# Patient Record
Sex: Male | Born: 1940
Health system: Southern US, Community
[De-identification: ages and names within clinical notes are randomized; demographics above are authoritative.]

## PROBLEM LIST (undated history)

## (undated) DIAGNOSIS — Z9289 Personal history of other medical treatment: Secondary | ICD-10-CM

## (undated) DIAGNOSIS — M1990 Unspecified osteoarthritis, unspecified site: Secondary | ICD-10-CM

## (undated) DIAGNOSIS — E785 Hyperlipidemia, unspecified: Secondary | ICD-10-CM

## (undated) DIAGNOSIS — I2510 Atherosclerotic heart disease of native coronary artery without angina pectoris: Secondary | ICD-10-CM

## (undated) DIAGNOSIS — C4490 Unspecified malignant neoplasm of skin, unspecified: Secondary | ICD-10-CM

## (undated) DIAGNOSIS — I10 Essential (primary) hypertension: Secondary | ICD-10-CM

## (undated) HISTORY — PX: INGUINAL HERNIA REPAIR: SHX194

## (undated) HISTORY — DX: Atherosclerotic heart disease of native coronary artery without angina pectoris: I25.10

## (undated) HISTORY — PX: VASECTOMY: SHX75

## (undated) HISTORY — PX: HIP SURGERY: SHX245

## (undated) HISTORY — DX: Hyperlipidemia, unspecified: E78.5

## (undated) HISTORY — DX: Essential (primary) hypertension: I10

## (undated) HISTORY — PX: CORONARY ARTERY BYPASS GRAFT: SHX141

## (undated) HISTORY — DX: Unspecified malignant neoplasm of skin, unspecified: C44.90

---

## 2003-10-03 ENCOUNTER — Encounter: Admission: RE | Admit: 2003-10-03 | Discharge: 2003-10-03 | Payer: Self-pay | Admitting: Family Medicine

## 2006-09-16 ENCOUNTER — Ambulatory Visit: Payer: Self-pay | Admitting: Family Medicine

## 2006-10-21 ENCOUNTER — Encounter: Admission: RE | Admit: 2006-10-21 | Discharge: 2006-10-21 | Payer: Self-pay | Admitting: Family Medicine

## 2006-10-21 ENCOUNTER — Ambulatory Visit: Payer: Self-pay | Admitting: Family Medicine

## 2006-10-21 LAB — CONVERTED CEMR LAB
Cholesterol: 185 mg/dL (ref 0–200)
Glucose, Bld: 111 mg/dL — ABNORMAL HIGH (ref 70–99)
HDL: 51.4 mg/dL (ref >39.0)
LDL Cholesterol: 120 mg/dL — ABNORMAL HIGH (ref 0–99)
PSA: 0.82 ng/mL (ref 0.10–4.00)
Total CHOL/HDL Ratio: 3.6
Triglycerides: 68 mg/dL (ref 0–149)
VLDL: 14 mg/dL (ref 0–40)

## 2006-11-04 ENCOUNTER — Ambulatory Visit: Payer: Self-pay | Admitting: Family Medicine

## 2007-09-10 ENCOUNTER — Ambulatory Visit: Payer: Self-pay | Admitting: Family Medicine

## 2007-09-10 DIAGNOSIS — C4490 Unspecified malignant neoplasm of skin, unspecified: Secondary | ICD-10-CM | POA: Insufficient documentation

## 2007-09-11 ENCOUNTER — Encounter (INDEPENDENT_AMBULATORY_CARE_PROVIDER_SITE_OTHER): Payer: Self-pay | Admitting: Family Medicine

## 2007-10-06 ENCOUNTER — Encounter (INDEPENDENT_AMBULATORY_CARE_PROVIDER_SITE_OTHER): Payer: Self-pay | Admitting: Family Medicine

## 2007-11-02 ENCOUNTER — Ambulatory Visit: Payer: Self-pay | Admitting: Family Medicine

## 2007-11-24 ENCOUNTER — Telehealth (INDEPENDENT_AMBULATORY_CARE_PROVIDER_SITE_OTHER): Payer: Self-pay | Admitting: *Deleted

## 2008-03-03 ENCOUNTER — Telehealth (INDEPENDENT_AMBULATORY_CARE_PROVIDER_SITE_OTHER): Payer: Self-pay | Admitting: *Deleted

## 2008-03-04 ENCOUNTER — Ambulatory Visit: Payer: Self-pay | Admitting: Internal Medicine

## 2009-12-06 ENCOUNTER — Ambulatory Visit: Payer: Self-pay | Admitting: Internal Medicine

## 2009-12-06 DIAGNOSIS — L989 Disorder of the skin and subcutaneous tissue, unspecified: Secondary | ICD-10-CM | POA: Insufficient documentation

## 2009-12-13 ENCOUNTER — Encounter: Payer: Self-pay | Admitting: Internal Medicine

## 2010-06-05 ENCOUNTER — Encounter: Payer: Self-pay | Admitting: Internal Medicine

## 2010-11-06 NOTE — Letter (Signed)
Summary: Handout Printed  Printed Handout:  - Elbow (Olecranon) Bursitis

## 2010-11-06 NOTE — Progress Notes (Signed)
Summary: pintches nerved  Phone Note Call from Patient Call back at Home Phone [phone]   Caller: Patient Reason for Call: Acute Illness Summary of Call: dr. Blossom Hoops patient has a pintched nerve and would like to be seen. Initial call taken by: Charolette Child,  Mar 03, 2008 1:00 PM  Follow-up for Phone Call        spoke with pt who c/o pinced nerve r side buttock area  down to leg ov sched for tomorrow.Kandice Hams  Mar 03, 2008 3:26 PM  Follow-up by: Kandice Hams,  Mar 03, 2008 3:26 PM

## 2010-11-06 NOTE — Assessment & Plan Note (Signed)
Summary: sore on left ear//lch   Vital Signs:  Patient profile:   70 year old male Height:      70 inches Weight:      174.4 pounds BMI:     25.11 Temp:     97.3 degrees F BP sitting:   120 / 70  Vitals Entered By: Shary Decamp (December 06, 2009 10:21 AM) CC: sore on left ear x 1 month, not healing, hx of skin Ca on ear (pt not sure if it was R or L)   History of Present Illness: as above  Current Medications (verified): 1)  None  Allergies (verified): No Known Drug Allergies  Past History:  Past Medical History: skin cancer in R calf and the ear (?side)  Past Surgical History: Inguinal herniorrhaphy 3 L hip surgeries (initial hip replacement bleed)  Social History: Reviewed history from 09/10/2007 and no changes required. Occupation:Business Owner Divorced Never Smoked Regular exercise-yes: 1 hr daily of cardiovascular exercise and weight training.  Physical Exam  General:  alert and well-developed.   Skin:  at the tip of  the left ear he has a 1 mm ulcer-like lesion with evidence of recent bleeding   Impression & Recommendations:  Problem # 1:  SKIN LESION (ICD-709.9)  likely a SCC refer to Dr. Terri Piedra  Orders: Dermatology Referral (Derma)  Problem # 2:  recommend to a schedule a checkup at his convenience

## 2010-11-06 NOTE — Consult Note (Signed)
Summary: [facility]  [facility]   Imported By: Lanelle Bal 12/25/2009 08:45:09  _____________________________________________________________________  External Attachment:    Type:   Image     Comment:   External Document

## 2010-11-06 NOTE — Assessment & Plan Note (Signed)
Summary: hip surgery clearance//tl   Vital Signs:  Patient Profile:   70 Years Old Male Weight:      167 pounds Temp:     98.0 degrees F oral Pulse rate:   72 / minute Resp:     14 per minute BP sitting:   120 / 80  (right arm)  Pt. in pain?   no  Vitals Entered By: Ardyth Man (September 10, 2007 3:31 PM)                  Chief Complaint:  Medical Clearance for Hip Surgery and Pre-op Evaluation.  History of Present Illness:  Pre-Op Evaluation      I was asked to see this delightful patient today for Pre-op Evaluation.  Patient is scheduled for left THR on 09/22/2007. Very active male who  exercises daily.  The patient denies respiratory symptoms, GI bleeding, chest pain, edema, PND, heavy ETOH use, and smoking.  Patient has no history of acute or recent MI, unstable or severe angina, decompensated CHF, high grade AV block, symptomatic ventricular arrhythmia, and severe valvular disease.  Patient has no history of mild angina(m), previous MI(m), compensated CHF(m), diabetes(m), renal insufficiency(m), and rhythm other than sinus(l).  There is no history of antiplatelet agents, chronic steroids, warfarin, diabetes meds, antianginal meds, bleeding disorder, and FH anesthesia reaction.      Past Surgical History:    Inguinal herniorrhaphy   Family History:    unremarkable  Social History:    Occupation:Business Owner    Divorced    Never Smoked    Regular exercise-yes: 1 hr daily of cardiovascular exercise and weight training.   Risk Factors:  Tobacco use:  never Exercise:  yes    Physical Exam  General:     Well-developed,well-nourished,in no acute distress; alert,appropriate and cooperative throughout examination Neck:     No deformities, masses, or tenderness noted. Lungs:     Normal respiratory effort, chest expands symmetrically. Lungs are clear to auscultation, no crackles or wheezes. Heart:     Normal rate and regular rhythm. S1 and S2 normal  without gallop, murmur, click, rub or other extra sounds. Pulses:     R and L carotid,radial,dorsalis pedis and posterior tibial pulses are full and equal bilaterally Extremities:     No clubbing, cyanosis, edema, or deformity noted with normal full range of motion of all joints.      Impression & Recommendations:  Problem # 1:  PREOPERATIVE EXAMINATION (ICD-V72.84) Patient has no contra-indication for surgery. Form completed and faxed to Ortho. Orders: EKG w/ Interpretation (93000)   Complete Medication List: 1)  Darvocet-n 100 100-650 Mg Tabs (Propoxyphene n-apap) 2)  Advil 200 Mg Caps (Ibuprofen)     ]

## 2010-11-06 NOTE — Letter (Signed)
[facility]   Imported By: Freddy Jaksch 09/11/2007 14:21:22  _____________________________________________________________________  External Attachment:    Type:   Image     Comment:   External Document

## 2010-11-06 NOTE — Letter (Signed)
Summary: Discharge Summary  Discharge Summary   Imported By: Freddy Jaksch 10/09/2007 10:31:41  _____________________________________________________________________  External Attachment:    Type:   Image     Comment:   External Document

## 2010-11-06 NOTE — Assessment & Plan Note (Signed)
Summary: acute/pinced nerve buttock area/alr   Vital Signs:  Patient Profile:   70 Years Old Male Weight:      177.38 pounds Temp:     98.1 degrees F oral Pulse rate:   58 / minute BP sitting:   116 / 68  (left arm)  Vitals Entered By: Glendell Docker (Mar 04, 2008 2:28 PM)                 Chief Complaint:  Evaluation of pinched nerve and Back pain.  History of Present Illness:  Back Pain      This is a 70 year old man who presents with Back pain.  The patient denies fever, chills, weakness, and loss of sensation.  The pain is located in the right buttock.  The pain began at home and gradually.  The pain radiates to the right ankle.  The pain is made worse by standing or walking.  The pain is made better by NSAID medications.  He reports history of left hip replacement.  He favored right side during rehab.      Current Allergies (reviewed today): No known allergies    Social History:    Reviewed history from 09/10/2007 and no changes required:       Occupation:Business Owner       Divorced       Never Smoked       Regular exercise-yes: 1 hr daily of cardiovascular exercise and weight training.    Review of Systems      See HPI   Physical Exam  General:     alert, well-developed, and well-nourished.   Head:     normocephalic and atraumatic.   Neck:     supple and no masses.   Lungs:     normal respiratory effort and normal breath sounds.   Heart:     normal rate, regular rhythm, no murmur, and no gallop.   Abdomen:     soft and non-tender.   Neurologic:     alert & oriented X3, cranial nerves II-XII intact, strength normal in all extremities, gait normal, and DTRs symmetrical and normal.     Detailed Back/Spine Exam  Lumbosacral Exam:  Inspection-deformity:    Normal Palpation-spinal tenderness:  Normal Lying Straight Leg Raise:    Right:  negative    Left:  negative Toe Walking:    Right:  normal    Left:  normal Heel Walking:    Right:   normal    Left:  normal    Impression & Recommendations:  Problem # 1:  SCIATICA, RIGHT (ICD-724.3) Assessment: New Pt with right buttock pain that radiates to right foot.  Pt is neurologically intack.  I doubt radiculopathy.  Pt may have piriformis syndrome.  I rec Alleve twice daily x 3 to 5 days.  I demonstrated stretching exercises.  Patient advised to call office if symptoms worsen.   The following medications were removed from the medication list:    Naprosyn 500 Mg Tabs (Naproxen) .Marland Kitchen... 1 two times a day pc prn    Patient Instructions: 1)  Call office if your back pain does not improve within 1-2 weeks. 2)  You can take Alleve one tab twice daily as needed with food.  Please perform stretching exercises as discussed.   ] Current Allergies (reviewed today): No known allergies

## 2010-11-06 NOTE — Assessment & Plan Note (Signed)
Summary: BURSITIS RIGHT ELBOW//ALJ   Vital Signs:  Patient Profile:   70 Years Old Male Weight:      167 pounds Temp:     97.8 degrees F oral Pulse rate:   68 / minute Resp:     14 per minute BP sitting:   108 / 70  (right arm)  Pt. in pain?   no  Vitals Entered By: Ardyth Man (November 02, 2007 9:53 AM)                  Chief Complaint:  bursitis in right elbow.  History of Present Illness: Patient reports some swelling of right elbow for 1 month. No significant discomfort.  No Complaints of fever or chills. Patient has similar occurence in the past that responded to anti-inflammatory.  Current Allergies: No known allergies       Physical Exam  General:     Well-developed,well-nourished,in no acute distress; alert,appropriate and cooperative throughout examination Msk:     Examination of right elbow is signficant for mild swelling over the olecranon. Nontender to palpation. No redness noted. Full ROM.     Impression & Recommendations:  Problem # 1:  BURSITIS, RIGHT ELBOW (ICD-726.33) Anticipatory guidance reviewed Patient educational material with treatment provided and reviewed Naprosyn 500 mg two times a day for 1 week with Prevacid 30mg  QAM provided Side effects reviewed Not other anti-inflammatory while taking above. F/u if no improvement or if symptoms worsen. Patient scheduled to see his Ortho in 3 weeks. Will let him know if no better by then.  Complete Medication List: 1)  Naprosyn 500 Mg Tabs (Naproxen) .Marland Kitchen.. 1 two times a day pc prn     Prescriptions: NAPROSYN 500 MG TABS (NAPROXEN) 1 two times a day pc prn  #26 x 0   Entered and Authorized by:   Leanne Chang MD   Signed by:   Leanne Chang MD on 11/02/2007   Method used:   Print then Give to Patient   RxID:   [id number]  ]  Appended Document: BURSITIS RIGHT ELBOW//ALJ Please find out if he ready to schedule colonoscopy. If not, please let us know when he is ready.

## 2010-11-06 NOTE — Letter (Signed)
Summary: MI-CABG 05-2010---CV surgery  [facility]   Imported By: Lanelle Bal 06/18/2010 14:17:26  _____________________________________________________________________  External Attachment:    Type:   Image     Comment:   External Document

## 2010-11-06 NOTE — Progress Notes (Signed)
  Phone Note Outgoing Call Call back at [facility] Phone [phone]   Call placed by: Ardyth Man,  November 24, 2007 9:27 AM Call placed to: Patient Summary of Call: Left message for patient to call the office and let us know if he is ready to have the colonoscopy done and if not to let us know when he is ready.  ...................................................................Ardyth Man  November 24, 2007 9:28 AM

## 2011-02-22 NOTE — Assessment & Plan Note (Signed)
[facility]                        [place] OFFICE NOTE   Tyler, Ortega                      MRN:          [id number]  DATE:09/16/2006                            DOB:          [birth year]    REASON FOR VISIT:  Growth on back.  Tyler Ortega is a 70 year old male  here, new to our office.  He reports that several days ago he noticed a  large lump in right upper back.  He states that it is not uncomfortable.  He denies any trauma.   Patient also complains of right deltoid pain.  The patient works out  regularly, and states that with bench pressing  there is discomfort in  his anterior shoulder.  It has improved some in the last several weeks.  He is not taking any medicines for the discomfort.   PAST MEDICAL HISTORY:  None.   SURGICAL HISTORY:  Hernia repair.   MEDICATIONS:  None.   ALLERGIES:  NO KNOWN DRUG ALLERGIES.   FAMILY HISTORY:  Mother and father passed away secondary to natural  causes.  He has 2 siblings who are alive and well.   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  He is  divorced with 3 children.  Does not smoke, and drinks alcohol rarely.   HEALTH MAINTENANCE ISSUES:  He is aware that he is not up to date on his  colonoscopy, but will schedule a complete physical in the near future to  discuss his options.   REVIEW OF SYSTEMS:  As per HPI, otherwise unremarkable.   OBJECTIVE:  Weight 169.  Temperature 96.9.  Pulse 78.  Blood pressure  130/90.  GENERAL:  Pleasant male, in no acute distress.  Answers questions  appropriately.  Examination of the upper back significant for a 3x4 inch subcutaneous  soft, easily movable nodule, well demarcated by palpation.  Examination of the left shoulder significant for full range of motion.  Resisted abduction is unremarkable.  There is mild discomfort with  palpation of the anterior deltoid.  NEUROLOGIC:  Nonfocal.   IMPRESSION:  1. Cystic nodule of the  upper back consistent with a lipoma.  2. Left deltoid strain.   PLAN:  1. I have advised patient to avoid any precipitating activities.      Reviewed stretching exercises.  Recommended he take an      antiinflammatory like Aleve for the next 7 to 10 days.  He is aware      of side-effects.  If no improvement, further evaluation is      warranted.  Patient was advised to gradually increase his exercise      activity as tolerated after 10 days.  Patient expressed      understanding.  2. In regards to the cystic nodule on his back, patient would prefer      to have it excised, and therefore, he will be referred to a general      surgeon.     Leanne Chang, M.D.  Electronically Signed    LA/MedQ  DD: 09/17/2006  DT: 09/17/2006  Job #:  [id number] 

## 2011-04-18 ENCOUNTER — Encounter: Payer: Self-pay | Admitting: Internal Medicine

## 2011-04-24 ENCOUNTER — Encounter: Payer: Self-pay | Admitting: Internal Medicine

## 2011-04-25 ENCOUNTER — Ambulatory Visit (INDEPENDENT_AMBULATORY_CARE_PROVIDER_SITE_OTHER): Payer: Medicare Other | Admitting: Internal Medicine

## 2011-04-25 ENCOUNTER — Encounter: Payer: Self-pay | Admitting: Internal Medicine

## 2011-04-25 DIAGNOSIS — Z0000 Encounter for general adult medical examination without abnormal findings: Secondary | ICD-10-CM | POA: Insufficient documentation

## 2011-04-25 DIAGNOSIS — I10 Essential (primary) hypertension: Secondary | ICD-10-CM

## 2011-04-25 DIAGNOSIS — I2510 Atherosclerotic heart disease of native coronary artery without angina pectoris: Secondary | ICD-10-CM

## 2011-04-25 DIAGNOSIS — Z23 Encounter for immunization: Secondary | ICD-10-CM

## 2011-04-25 DIAGNOSIS — E785 Hyperlipidemia, unspecified: Secondary | ICD-10-CM

## 2011-04-25 LAB — TSH: TSH: 1.443 u[IU]/mL (ref 0.350–4.500)

## 2011-04-25 LAB — CBC WITH DIFFERENTIAL/PLATELET
Basophils Absolute: 0 10*3/uL (ref 0.0–0.1)
Basophils Relative: 1 % (ref 0–1)
Eosinophils Absolute: 0.3 10*3/uL (ref 0.0–0.7)
Eosinophils Relative: 5 % (ref 0–5)
HCT: 45.5 % (ref 39.0–52.0)
Hemoglobin: 14.8 g/dL (ref 13.0–17.0)
Lymphocytes Relative: 25 % (ref 12–46)
Lymphs Abs: 1.5 10*3/uL (ref 0.7–4.0)
MCH: 32 pg (ref 26.0–34.0)
MCHC: 32.5 g/dL (ref 30.0–36.0)
MCV: 98.5 fL (ref 78.0–100.0)
Monocytes Absolute: 0.6 10*3/uL (ref 0.1–1.0)
Monocytes Relative: 9 % (ref 3–12)
Neutro Abs: 3.6 10*3/uL (ref 1.7–7.7)
Neutrophils Relative %: 60 % (ref 43–77)
Platelets: 182 10*3/uL (ref 150–400)
RBC: 4.62 MIL/uL (ref 4.22–5.81)
RDW: 13.9 % (ref 11.5–15.5)
WBC: 6 10*3/uL (ref 4.0–10.5)

## 2011-04-25 LAB — LIPID PANEL
Cholesterol: 170 mg/dL (ref 0–200)
HDL: 54 mg/dL (ref >39)
LDL Cholesterol: 101 mg/dL — ABNORMAL HIGH (ref 0–99)
Total CHOL/HDL Ratio: 3.1 Ratio
Triglycerides: 76 mg/dL (ref <150)
VLDL: 15 mg/dL (ref 0–40)

## 2011-04-25 LAB — COMPREHENSIVE METABOLIC PANEL
ALT: 13 U/L (ref 0–53)
AST: 18 U/L (ref 0–37)
Albumin: 4.2 g/dL (ref 3.5–5.2)
Alkaline Phosphatase: 56 U/L (ref 39–117)
BUN: 25 mg/dL — ABNORMAL HIGH (ref 6–23)
CO2: 22 mEq/L (ref 19–32)
Calcium: 9.2 mg/dL (ref 8.4–10.5)
Chloride: 105 mEq/L (ref 96–112)
Creat: 1.19 mg/dL (ref 0.50–1.35)
Glucose, Bld: 93 mg/dL (ref 70–99)
Potassium: 4.7 mEq/L (ref 3.5–5.3)
Sodium: 138 mEq/L (ref 135–145)
Total Bilirubin: 0.5 mg/dL (ref 0.3–1.2)
Total Protein: 7.2 g/dL (ref 6.0–8.3)

## 2011-04-25 LAB — PSA: PSA: 1.09 ng/mL (ref <=4.00)

## 2011-04-25 MED ORDER — ZOSTER VACCINE LIVE 19400 UNT/0.65ML ~~LOC~~ SOLR: 0.6500 mL | Freq: Once | SUBCUTANEOUS | Status: DC

## 2011-04-25 NOTE — Assessment & Plan Note (Signed)
Last cardiology visit a month ago, reportedly, they did not check his cholesterol. Labs

## 2011-04-25 NOTE — Assessment & Plan Note (Addendum)
Td today Pneumonia shot:  suspect he had one at the time of the CABG. Will reassess next year zostavax discussed-- likes to proceed, rx provided Never had a cscope--- refer to GI Labs Cont healthy life style No evidence of RLS on clinical grounds (see HPI)

## 2011-04-25 NOTE — Patient Instructions (Signed)
Please get records from your cardiologist.

## 2011-04-25 NOTE — Assessment & Plan Note (Signed)
Doing well 

## 2011-04-25 NOTE — Progress Notes (Signed)
  Subjective:    Patient ID: Tyler Ortega, male    DOB: 10/19/1940, 70 y.o.   MRN: [id number]  HPI New patient  --question of RLS: from time has involuntary leg jerks at night, no parethesias -- had a  MI last year, status post CABG, doing great.has seen cards regularly --Likes a physical exam --hyperlipidemia : good med compliance , no recent labs to his knowledge  Past Medical History  Diagnosis Date  . Skin cancer     R calf and the ear (?side)  . Hypertension   . Hyperlipidemia   . CAD (coronary artery disease)     MI 2011, seen @ [place], sees Tax adviser (once a year)   Past Surgical History  Procedure Date  . Inguinal hernia repair   . Hip surgery     3 L hip (initial hip replacement bleed)  . Arterial bypass surgry     05-2010   History   Social History  . Marital Status: Married    Spouse Name: N/A    Number of Children: 3  . Years of Education: N/A   Occupational History  . Business Owner    Social History Main Topics  . Smoking status: Never Smoker   . Smokeless tobacco: Not on file  . Alcohol Use: Yes     rarely   . Drug Use: No  . Sexually Active: Not on file   Other Topics Concern  . Not on file   Social History Narrative   ! Daughter lives in [place]--- pt owns a car lot---lives w/ wife---Regular exercise- yes: 1 hr daily of cardiovascular exercise and weight training.   Family History  Problem Relation Age of Onset  . Coronary artery disease Neg Hx   . Diabetes Neg Hx   . Colon cancer Neg Hx   . Prostate cancer Neg Hx      Review of Systems  Constitutional: Negative for fever, fatigue and unexpected weight change.  Respiratory: Negative for cough and wheezing.   Cardiovascular: Negative for chest pain and leg swelling.  Gastrointestinal: Negative for abdominal pain and blood in stool.  Genitourinary: Negative for hematuria and difficulty urinating.  Psychiatric/Behavioral:       No anxiety-depression at present        Objective:    Physical Exam  Constitutional: He is oriented to person, place, and time. He appears well-developed and well-nourished. No distress.  HENT:  Head: Normocephalic and atraumatic.  Neck: No thyromegaly present.       Palpable carotid pulses bilaterally, no bruit  Cardiovascular: Normal rate, regular rhythm and normal heart sounds.   No murmur heard. Pulmonary/Chest: Effort normal and breath sounds normal. No respiratory distress. He has no wheezes. He has no rales.  Abdominal: Soft. Bowel sounds are normal. He exhibits no distension. There is no rebound and no guarding.  Genitourinary: Rectum normal and prostate normal. Guaiac negative stool.       External hemorrhoids noted  Musculoskeletal: He exhibits no edema.  Neurological: He is alert and oriented to person, place, and time.  Skin: He is not diaphoretic.  Psychiatric: He has a normal mood and affect. His behavior is normal. Judgment and thought content normal.          Assessment & Plan:

## 2011-04-26 ENCOUNTER — Encounter: Payer: Self-pay | Admitting: Internal Medicine

## 2011-04-26 NOTE — Assessment & Plan Note (Signed)
Well-controlled, no change 

## 2011-04-30 ENCOUNTER — Telehealth: Payer: Self-pay | Admitting: *Deleted

## 2011-04-30 MED ORDER — ATORVASTATIN CALCIUM 40 MG PO TABS: 40.0000 mg | ORAL_TABLET | Freq: Every day | ORAL | Status: DC

## 2011-04-30 NOTE — Telephone Encounter (Signed)
Message left for patient to return my call.  

## 2011-04-30 NOTE — Telephone Encounter (Signed)
Pt is aware, he would prefer Dr.Paz to monitor his cholesterol.

## 2011-04-30 NOTE — Telephone Encounter (Signed)
Message copied by Leanne Lovely on Tue Apr 30, 2011  1:13 PM ------      Message from: Willow Ora E      Created: Mon Apr 29, 2011  6:05 PM       Advise patient      PSA is normal      Cholesterol is not at goal, LDL is 101 and his LDL-goal is close to 70.      If he likes me to take care of his cholesterol, my recommendation is to switch to Lipitor 40 mg 1 po qd (#30, 4 RF) and to come back in 10 weeks for a OV.      If he likes cardiology to take care of his cholesterol, then fax to  cardiology all the results.      Other labs within normal

## 2011-05-15 ENCOUNTER — Ambulatory Visit (AMBULATORY_SURGERY_CENTER): Payer: Medicare Other | Admitting: *Deleted

## 2011-05-15 VITALS — Ht 70.5 in | Wt 177.0 lb

## 2011-05-15 DIAGNOSIS — Z1211 Encounter for screening for malignant neoplasm of colon: Secondary | ICD-10-CM

## 2011-05-15 MED ORDER — PEG-KCL-NACL-NASULF-NA ASC-C 100 G PO SOLR: ORAL | Status: DC

## 2011-05-29 ENCOUNTER — Encounter: Payer: Self-pay | Admitting: Internal Medicine

## 2011-05-29 ENCOUNTER — Ambulatory Visit (AMBULATORY_SURGERY_CENTER): Payer: Medicare Other | Admitting: Internal Medicine

## 2011-05-29 VITALS — BP 114/73 | HR 81 | Temp 97.9°F | Resp 13 | Ht 70.0 in | Wt 177.0 lb

## 2011-05-29 DIAGNOSIS — Z1211 Encounter for screening for malignant neoplasm of colon: Secondary | ICD-10-CM

## 2011-05-29 HISTORY — PX: COLONOSCOPY: SHX174

## 2011-05-29 LAB — HM COLONOSCOPY

## 2011-05-29 MED ORDER — SODIUM CHLORIDE 0.9 % IV SOLN: 500.0000 mL | INTRAVENOUS | Status: DC

## 2011-05-29 NOTE — Patient Instructions (Addendum)
You had a normal colonoscopy. You should consider having another routine repeat colonoscopy in about 10 years - 2022. That will depend upon your health status at that time.  Iva Boop, MD, [facility]   FOLLOW DISCHARGE INSTRUCTIONS (BLUE &GREEN SHEETS) FOR SAFETY PRECAUTIONS & DIET RECOMMENDED FOR TODAY.

## 2011-05-29 NOTE — Progress Notes (Signed)
Hung 2nd bag of 0.9% n.s.  MAW  Pt's b/p dropped to 82/55 retook b/p 101/60 .  IV fluid wide open drip.  Pt W,D,P and R.  MAW  Pt tolerated the colon exam well. MAW

## 2011-05-30 ENCOUNTER — Telehealth: Payer: Self-pay | Admitting: *Deleted

## 2011-05-30 NOTE — Telephone Encounter (Signed)
Left message to call us if necessary. 

## 2011-07-18 ENCOUNTER — Encounter: Payer: Self-pay | Admitting: Internal Medicine

## 2011-07-19 ENCOUNTER — Ambulatory Visit (INDEPENDENT_AMBULATORY_CARE_PROVIDER_SITE_OTHER): Payer: Medicare Other | Admitting: Internal Medicine

## 2011-07-19 ENCOUNTER — Encounter: Payer: Self-pay | Admitting: Internal Medicine

## 2011-07-19 VITALS — BP 110/62 | HR 66 | Temp 98.0°F | Resp 14 | Wt 176.0 lb

## 2011-07-19 DIAGNOSIS — I2510 Atherosclerotic heart disease of native coronary artery without angina pectoris: Secondary | ICD-10-CM

## 2011-07-19 DIAGNOSIS — E785 Hyperlipidemia, unspecified: Secondary | ICD-10-CM

## 2011-07-19 LAB — LIPID PANEL
Cholesterol: 114 mg/dL (ref 0–200)
HDL: 50.3 mg/dL (ref >39.00)
LDL Cholesterol: 49 mg/dL (ref 0–99)
Total CHOL/HDL Ratio: 2
Triglycerides: 74 mg/dL (ref 0.0–149.0)
VLDL: 14.8 mg/dL (ref 0.0–40.0)

## 2011-07-19 LAB — ALT: ALT: 14 U/L (ref 0–53)

## 2011-07-19 LAB — AST: AST: 17 U/L (ref 0–37)

## 2011-07-19 NOTE — Assessment & Plan Note (Signed)
Asymptomatic, doing well. 

## 2011-07-19 NOTE — Assessment & Plan Note (Signed)
Based on the last panel, we changed to Lipitor 40 mg, good compliance and no side effects.  Labs.  LDL goal 70.

## 2011-07-19 NOTE — Progress Notes (Signed)
  Subjective:    Patient ID: Tyler Ortega, male    DOB: [birth year], 70 y.o.   MRN: [id number]  HPI Routine office visit, was switched to Lipitor 40 mg, no side effects.  Past Medical History  Diagnosis Date  . Hypertension   . Hyperlipidemia   . CAD (coronary artery disease)     MI 2011, seen @ [place], sees Tax adviser (once a year)  . Skin cancer     R calf and the ear (?side)  . Myocardial infarction 2011     Review of Systems Had a flu shot already Denies any myalgias. Continue to be active and eating healthy No chest pain, shortness of breath, nausea or vomiting.    Objective:   Physical Exam  Constitutional: He is oriented to person, place, and time. He appears well-developed and well-nourished.  Cardiovascular: Normal rate, regular rhythm and normal heart sounds.   No murmur heard. Pulmonary/Chest: Effort normal and breath sounds normal. No respiratory distress. He has no wheezes. He has no rales.  Musculoskeletal: He exhibits no edema.  Neurological: He is alert and oriented to person, place, and time.          Assessment & Plan:

## 2011-09-22 ENCOUNTER — Other Ambulatory Visit: Payer: Self-pay | Admitting: Internal Medicine

## 2012-02-15 ENCOUNTER — Other Ambulatory Visit: Payer: Self-pay | Admitting: Internal Medicine

## 2012-02-17 NOTE — Telephone Encounter (Signed)
Refill done.  

## 2012-08-18 ENCOUNTER — Other Ambulatory Visit: Payer: Self-pay | Admitting: Internal Medicine

## 2012-08-18 NOTE — Telephone Encounter (Signed)
Refill done. Pt must schedule an appt for future refills.

## 2014-05-30 ENCOUNTER — Ambulatory Visit (INDEPENDENT_AMBULATORY_CARE_PROVIDER_SITE_OTHER): Payer: Medicare Other | Admitting: Internal Medicine

## 2014-05-30 ENCOUNTER — Encounter: Payer: Self-pay | Admitting: Internal Medicine

## 2014-05-30 VITALS — BP 122/72 | HR 62 | Temp 98.2°F | Ht 68.0 in | Wt 174.1 lb

## 2014-05-30 DIAGNOSIS — Z125 Encounter for screening for malignant neoplasm of prostate: Secondary | ICD-10-CM

## 2014-05-30 DIAGNOSIS — I25810 Atherosclerosis of coronary artery bypass graft(s) without angina pectoris: Secondary | ICD-10-CM

## 2014-05-30 DIAGNOSIS — E785 Hyperlipidemia, unspecified: Secondary | ICD-10-CM

## 2014-05-30 DIAGNOSIS — Z23 Encounter for immunization: Secondary | ICD-10-CM

## 2014-05-30 DIAGNOSIS — C4490 Unspecified malignant neoplasm of skin, unspecified: Secondary | ICD-10-CM

## 2014-05-30 DIAGNOSIS — I10 Essential (primary) hypertension: Secondary | ICD-10-CM

## 2014-05-30 DIAGNOSIS — Z0000 Encounter for general adult medical examination without abnormal findings: Secondary | ICD-10-CM

## 2014-05-30 LAB — LIPID PANEL
Cholesterol: 137 mg/dL (ref 0–200)
HDL: 52.6 mg/dL (ref >39.00)
LDL Cholesterol: 71 mg/dL (ref 0–99)
NonHDL: 84.4
Total CHOL/HDL Ratio: 3
Triglycerides: 68 mg/dL (ref 0.0–149.0)
VLDL: 13.6 mg/dL (ref 0.0–40.0)

## 2014-05-30 LAB — CBC WITH DIFFERENTIAL/PLATELET
Basophils Absolute: 0 10*3/uL (ref 0.0–0.1)
Basophils Relative: 0.4 % (ref 0.0–3.0)
Eosinophils Absolute: 0.4 10*3/uL (ref 0.0–0.7)
Eosinophils Relative: 5.1 % — ABNORMAL HIGH (ref 0.0–5.0)
HCT: 41.3 % (ref 39.0–52.0)
Hemoglobin: 14.2 g/dL (ref 13.0–17.0)
Lymphocytes Relative: 16.9 % (ref 12.0–46.0)
Lymphs Abs: 1.5 10*3/uL (ref 0.7–4.0)
MCHC: 34.4 g/dL (ref 30.0–36.0)
MCV: 95.6 fl (ref 78.0–100.0)
Monocytes Absolute: 0.6 10*3/uL (ref 0.1–1.0)
Monocytes Relative: 6.4 % (ref 3.0–12.0)
Neutro Abs: 6.2 10*3/uL (ref 1.4–7.7)
Neutrophils Relative %: 71.2 % (ref 43.0–77.0)
Platelets: 144 10*3/uL — ABNORMAL LOW (ref 150.0–400.0)
RBC: 4.32 Mil/uL (ref 4.22–5.81)
RDW: 13.8 % (ref 11.5–15.5)
WBC: 8.7 10*3/uL (ref 4.0–10.5)

## 2014-05-30 LAB — COMPREHENSIVE METABOLIC PANEL
ALT: 15 U/L (ref 0–53)
AST: 22 U/L (ref 0–37)
Albumin: 4 g/dL (ref 3.5–5.2)
Alkaline Phosphatase: 64 U/L (ref 39–117)
BUN: 22 mg/dL (ref 6–23)
CO2: 24 mEq/L (ref 19–32)
Calcium: 9.1 mg/dL (ref 8.4–10.5)
Chloride: 105 mEq/L (ref 96–112)
Creatinine, Ser: 1.3 mg/dL (ref 0.4–1.5)
GFR: 58.15 mL/min — ABNORMAL LOW (ref >60.00)
Glucose, Bld: 91 mg/dL (ref 70–99)
Potassium: 4.1 mEq/L (ref 3.5–5.1)
Sodium: 141 mEq/L (ref 135–145)
Total Bilirubin: 0.9 mg/dL (ref 0.2–1.2)
Total Protein: 6.6 g/dL (ref 6.0–8.3)

## 2014-05-30 LAB — PSA: PSA: 1.35 ng/mL (ref 0.10–4.00)

## 2014-05-30 LAB — TSH: TSH: 1.43 u[IU]/mL (ref 0.35–4.50)

## 2014-05-30 MED ORDER — ZOSTER VACCINE LIVE 19400 UNT/0.65ML ~~LOC~~ SOLR: 0.6500 mL | Freq: Once | SUBCUTANEOUS | Status: DC

## 2014-05-30 NOTE — Assessment & Plan Note (Signed)
On beta blockers, BP is great, no change

## 2014-05-30 NOTE — Assessment & Plan Note (Signed)
Td 2012 Pneumonia shot: prived today prevnar-- discuss on RTC zostavax discussed before, doesn't recall if he had it, Rx re issued   cscope normal 2012, next 10 years  Labs Cont healthy life style

## 2014-05-30 NOTE — Assessment & Plan Note (Signed)
Recommend to see dermatology regularly

## 2014-05-30 NOTE — Assessment & Plan Note (Signed)
Continue with statins, check labs

## 2014-05-30 NOTE — Progress Notes (Signed)
Pre-visit discussion using our clinic review tool. No additional management support is needed unless otherwise documented below in the visit note.  

## 2014-05-30 NOTE — Assessment & Plan Note (Signed)
Asymptomatic, sees Dr. Prince Rome yearly per patient.

## 2014-05-30 NOTE — Progress Notes (Signed)
Subjective:    Patient ID: Tyler Ortega, male    DOB: Jun 07, 1941, 73 y.o.   MRN: [id number]  DOS:  05/30/2014 Type of visit - description:  Here for Medicare AWV:  1. Risk factors based on Past M, S, F history: reviewed 2. Physical Activities: works out x 3/ week 3. Depression/mood: neg screening  4. Hearing:  No problemss noted or reported  5. ADL's:  Independent  6. Fall Risk: no recent fall, counseled 7. home Safety: does feel safe at home  8. Height, weight, & visual acuity: see VS, sees eye MD 9. Counseling: provided 10. Labs ordered based on risk factors: if needed  11. Referral Coordination: if needed 12. Care Plan, see assessment and plan  13. Cognitive Assessment: motor skills and cognition appropriate for age   In addition, today we discussed the following: CAD, he sees cardiology regularly. Hypertension, well controlled with beta blockers, BP today is excellent High cholesterol, on statins, who no apparent side effects.   ROS  No  CP, SOB Denies  nausea, vomiting diarrhea, blood in the stools No abdominal pain (-) cough, sputum production (-) wheezing, chest congestion No dysuria, gross hematuria, difficulty urinating     Past Medical History  Diagnosis Date  . Hypertension   . Hyperlipidemia   . CAD (coronary artery disease)     MI 2011, seen @ [name], sees Public librarian (once a year)  . Skin cancer     R calf and the ear (?side)    Past Surgical History  Procedure Laterality Date  . Inguinal hernia repair    . Hip surgery      3 L hip (initial hip replacement bleed)  . Coronary artery bypass graft      05-2010  . Colonoscopy  05/29/11    Normal    History   Social History  . Marital Status: Married    Spouse Name: N/A    Number of Children: 3  . Years of Education: N/A   Occupational History  . Armed forces operational officer, semi retired    .     Social History Main Topics  . Smoking status: Never Smoker   . Smokeless tobacco: Never Used  .  Alcohol Use: Yes     Comment: rarely   . Drug Use: No  . Sexual Activity: Not on file   Other Topics Concern  . Not on file   Social History Narrative   1 Daughter lives in [place], lives w/ wife---           Family History  Problem Relation Age of Onset  . Coronary artery disease Neg Hx   . Diabetes Neg Hx   . Colon cancer Neg Hx   . Prostate cancer Neg Hx       Medication List       This list is accurate as of: 05/30/14  8:43 AM.  Always use your most recent med list.               aspirin 325 MG EC tablet  Take 325 mg by mouth daily.     atorvastatin 40 MG tablet  Commonly known as:  LIPITOR  TAKE 1 TABLET EVERY DAY     metoprolol succinate 25 MG 24 hr tablet  Commonly known as:  TOPROL-XL  Take 25 mg by mouth daily.     zoster vaccine live (PF) 19400 UNT/0.65ML injection  Commonly known as:  ZOSTAVAX  Inject 19,400 Units into the skin  once.           Objective:   Physical Exam BP 122/72  Pulse 62  Temp(Src) 98.2 F (36.8 C) (Oral)  Ht 5\' 8"  (1.727 m)  Wt 174 lb 2 oz (78.983 kg)  BMI 26.48 kg/m2  SpO2 95%  General -- alert, well-developed, NAD.  Neck --no thyromegaly , normal carotid pulse  HEENT-- Not pale.  Lungs -- normal respiratory effort, no intercostal retractions, no accessory muscle use, and normal breath sounds.  Heart-- normal rate, regular rhythm, no murmur.  Abdomen-- Not distended, good bowel sounds,soft, non-tender. No bruit  Rectal-- + external hemorrhoids noted. Normal sphincter tone. No rectal masses or tenderness. Stool brown  Prostate--Prostate gland firm and smooth, no enlargement, nodularity, tenderness, mass, asymmetry or induration. Extremities-- no pretibial edema bilaterally  Neurologic--  alert & oriented X3. Speech normal, gait appropriate for age, strength symmetric and appropriate for age.   Psych-- Cognition and judgment appear intact. Cooperative with normal attention span and concentration. No anxious or depressed  appearing.      Assessment & Plan:

## 2014-05-30 NOTE — Patient Instructions (Signed)
Get your blood work before you leave   See the dermalogist yearly Consider taking Zostavax, the shingles shot  Next visit is for a physical exam in 1 year,  fasting Please make an appointment       Fall Prevention and Home Safety Falls cause injuries and can affect all age groups. It is possible to use preventive measures to significantly decrease the likelihood of falls. There are many simple measures which can make your home safer and prevent falls. OUTDOORS  Repair cracks and edges of walkways and driveways.  Remove high doorway thresholds.  Trim shrubbery on the main path into your home.  Have good outside lighting.  Clear walkways of tools, rocks, debris, and clutter.  Check that handrails are not broken and are securely fastened. Both sides of steps should have handrails.  Have leaves, snow, and ice cleared regularly.  Use sand or salt on walkways during winter months.  In the garage, clean up grease or oil spills. BATHROOM  Install night lights.  Install grab bars by the toilet and in the tub and shower.  Use non-skid mats or decals in the tub or shower.  Place a plastic non-slip stool in the shower to sit on, if needed.  Keep floors dry and clean up all water on the floor immediately.  Remove soap buildup in the tub or shower on a regular basis.  Secure bath mats with non-slip, double-sided rug tape.  Remove throw rugs and tripping hazards from the floors. BEDROOMS  Install night lights.  Make sure a bedside light is easy to reach.  Do not use oversized bedding.  Keep a telephone by your bedside.  Have a firm chair with side arms to use for getting dressed.  Remove throw rugs and tripping hazards from the floor. KITCHEN  Keep handles on pots and pans turned toward the center of the stove. Use back burners when possible.  Clean up spills quickly and allow time for drying.  Avoid walking on wet floors.  Avoid hot utensils and  knives.  Position shelves so they are not too high or low.  Place commonly used objects within easy reach.  If necessary, use a sturdy step stool with a grab bar when reaching.  Keep electrical cables out of the way.  Do not use floor polish or wax that makes floors slippery. If you must use wax, use non-skid floor wax.  Remove throw rugs and tripping hazards from the floor. STAIRWAYS  Never leave objects on stairs.  Place handrails on both sides of stairways and use them. Fix any loose handrails. Make sure handrails on both sides of the stairways are as long as the stairs.  Check carpeting to make sure it is firmly attached along stairs. Make repairs to worn or loose carpet promptly.  Avoid placing throw rugs at the top or bottom of stairways, or properly secure the rug with carpet tape to prevent slippage. Get rid of throw rugs, if possible.  Have an electrician put in a light switch at the top and bottom of the stairs. OTHER FALL PREVENTION TIPS  Wear low-heel or rubber-soled shoes that are supportive and fit well. Wear closed toe shoes.  When using a stepladder, make sure it is fully opened and both spreaders are firmly locked. Do not climb a closed stepladder.  Add color or contrast paint or tape to grab bars and handrails in your home. Place contrasting color strips on first and last steps.  Learn and use mobility  aids as needed. Install an electrical emergency response system.  Turn on lights to avoid dark areas. Replace light bulbs that burn out immediately. Get light switches that glow.  Arrange furniture to create clear pathways. Keep furniture in the same place.  Firmly attach carpet with non-skid or double-sided tape.  Eliminate uneven floor surfaces.  Select a carpet pattern that does not visually hide the edge of steps.  Be aware of all pets. OTHER HOME SAFETY TIPS  Set the water temperature for 120 F (48.8 C).  Keep emergency numbers on or near the  telephone.  Keep smoke detectors on every level of the home and near sleeping areas. Document Released: 09/13/2002 Document Revised: 03/24/2012 Document Reviewed: 12/13/2011 [facility] Patient Information 2015 [place], [place]. This information is not intended to replace advice given to you by your health care provider. Make sure you discuss any questions you have with your health care provider.

## 2014-05-31 ENCOUNTER — Telehealth: Payer: Self-pay | Admitting: Internal Medicine

## 2014-05-31 NOTE — Telephone Encounter (Signed)
Relevant patient education assigned to patient using Emmi. ° °

## 2014-08-16 ENCOUNTER — Telehealth: Payer: Self-pay | Admitting: *Deleted

## 2014-08-16 NOTE — Telephone Encounter (Signed)
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 Form forwarded to [name] for completion. JG//CMA

## 2014-08-17 NOTE — Telephone Encounter (Signed)
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

## 2014-08-19 DIAGNOSIS — Z7689 Persons encountering health services in other specified circumstances: Secondary | ICD-10-CM

## 2014-08-19 DIAGNOSIS — Z951 Presence of aortocoronary bypass graft: Secondary | ICD-10-CM | POA: Insufficient documentation

## 2014-08-19 NOTE — Telephone Encounter (Signed)
Completed form faxed back to patient [phone]. Confirmation received. JG//CMA

## 2014-12-16 ENCOUNTER — Encounter: Payer: Self-pay | Admitting: Internal Medicine

## 2015-05-25 ENCOUNTER — Telehealth: Payer: Self-pay | Admitting: Internal Medicine

## 2015-05-25 NOTE — Telephone Encounter (Signed)
pre visit letter mailed 05/11/15 °

## 2015-05-31 ENCOUNTER — Telehealth: Payer: Self-pay | Admitting: *Deleted

## 2015-05-31 ENCOUNTER — Encounter: Payer: Self-pay | Admitting: *Deleted

## 2015-05-31 NOTE — Addendum Note (Signed)
Addended by: Leticia Penna A on: 05/31/2015 09:45 AM   Modules accepted: Medications

## 2015-05-31 NOTE — Telephone Encounter (Signed)
Pre-Visit Call completed with patient and chart updated.   Pre-Visit Info documented in Specialty Comments under [employer].    

## 2015-05-31 NOTE — Telephone Encounter (Signed)
Unable to reach patient at time of Pre-Visit Call.  Left message for patient to return call when available.    

## 2015-06-01 ENCOUNTER — Ambulatory Visit (INDEPENDENT_AMBULATORY_CARE_PROVIDER_SITE_OTHER): Payer: Medicare Other | Admitting: Internal Medicine

## 2015-06-01 ENCOUNTER — Encounter: Payer: Self-pay | Admitting: Internal Medicine

## 2015-06-01 VITALS — BP 122/64 | HR 65 | Temp 97.6°F | Ht 68.25 in | Wt 174.0 lb

## 2015-06-01 DIAGNOSIS — E785 Hyperlipidemia, unspecified: Secondary | ICD-10-CM

## 2015-06-01 DIAGNOSIS — I10 Essential (primary) hypertension: Secondary | ICD-10-CM

## 2015-06-01 DIAGNOSIS — I25810 Atherosclerosis of coronary artery bypass graft(s) without angina pectoris: Secondary | ICD-10-CM

## 2015-06-01 DIAGNOSIS — Z23 Encounter for immunization: Secondary | ICD-10-CM

## 2015-06-01 DIAGNOSIS — Z0000 Encounter for general adult medical examination without abnormal findings: Secondary | ICD-10-CM | POA: Diagnosis not present

## 2015-06-01 LAB — LIPID PANEL
Cholesterol: 131 mg/dL (ref 0–200)
HDL: 51.5 mg/dL (ref >39.00)
LDL Cholesterol: 68 mg/dL (ref 0–99)
NonHDL: 79.07
Total CHOL/HDL Ratio: 3
Triglycerides: 57 mg/dL (ref 0.0–149.0)
VLDL: 11.4 mg/dL (ref 0.0–40.0)

## 2015-06-01 LAB — CBC WITH DIFFERENTIAL/PLATELET
Basophils Absolute: 0 10*3/uL (ref 0.0–0.1)
Basophils Relative: 0.6 % (ref 0.0–3.0)
Eosinophils Absolute: 0.3 10*3/uL (ref 0.0–0.7)
Eosinophils Relative: 6.3 % — ABNORMAL HIGH (ref 0.0–5.0)
HCT: 43.4 % (ref 39.0–52.0)
Hemoglobin: 14.5 g/dL (ref 13.0–17.0)
Lymphocytes Relative: 24.2 % (ref 12.0–46.0)
Lymphs Abs: 1.3 10*3/uL (ref 0.7–4.0)
MCHC: 33.4 g/dL (ref 30.0–36.0)
MCV: 97.3 fl (ref 78.0–100.0)
Monocytes Absolute: 0.5 10*3/uL (ref 0.1–1.0)
Monocytes Relative: 9.1 % (ref 3.0–12.0)
Neutro Abs: 3.1 10*3/uL (ref 1.4–7.7)
Neutrophils Relative %: 59.8 % (ref 43.0–77.0)
Platelets: 155 10*3/uL (ref 150.0–400.0)
RBC: 4.46 Mil/uL (ref 4.22–5.81)
RDW: 14 % (ref 11.5–15.5)
WBC: 5.2 10*3/uL (ref 4.0–10.5)

## 2015-06-01 LAB — BASIC METABOLIC PANEL
BUN: 27 mg/dL — ABNORMAL HIGH (ref 6–23)
CO2: 28 mEq/L (ref 19–32)
Calcium: 9.2 mg/dL (ref 8.4–10.5)
Chloride: 108 mEq/L (ref 96–112)
Creatinine, Ser: 1.2 mg/dL (ref 0.40–1.50)
GFR: 63.03 mL/min (ref >60.00)
Glucose, Bld: 99 mg/dL (ref 70–99)
Potassium: 4.5 mEq/L (ref 3.5–5.1)
Sodium: 143 mEq/L (ref 135–145)

## 2015-06-01 LAB — ALT: ALT: 19 U/L (ref 0–53)

## 2015-06-01 LAB — AST: AST: 21 U/L (ref 0–37)

## 2015-06-01 MED ORDER — ZOSTER VACCINE LIVE 19400 UNT/0.65ML ~~LOC~~ SOLR: 0.6500 mL | Freq: Once | SUBCUTANEOUS | Status: DC

## 2015-06-01 NOTE — Assessment & Plan Note (Addendum)
Cardiology discontinue beta blockers due to first-degree AV block. BP remains good

## 2015-06-01 NOTE — Assessment & Plan Note (Addendum)
Td 2012; Pneumonia shot: 2015; prevnar: today zostavax discussed before, declined before $$, will print a new rx CCS:  cscope normal 2012, next 10 years  DRE neg 2015, PSA:05/31/14 - 1.35. Reassess the need for continue screening every year  Labs Cont healthy life style  Some decreasing height, declined a  density test or x-rays of the spine

## 2015-06-01 NOTE — Progress Notes (Signed)
Pre visit review using our clinic review tool, if applicable. No additional management support is needed unless otherwise documented below in the visit note. 

## 2015-06-01 NOTE — Patient Instructions (Signed)
Get your blood work before you leave   Check the  blood pressure 2  times a month  Be sure your blood pressure is between 110/65 and  145/85.  if it is consistently higher or lower, let me know          Fall Prevention and Home Safety Falls cause injuries and can affect all age groups. It is possible to use preventive measures to significantly decrease the likelihood of falls. There are many simple measures which can make your home safer and prevent falls. OUTDOORS  Repair cracks and edges of walkways and driveways.  Remove high doorway thresholds.  Trim shrubbery on the main path into your home.  Have good outside lighting.  Clear walkways of tools, rocks, debris, and clutter.  Check that handrails are not broken and are securely fastened. Both sides of steps should have handrails.  Have leaves, snow, and ice cleared regularly.  Use sand or salt on walkways during winter months.  In the garage, clean up grease or oil spills. BATHROOM  Install night lights.  Install grab bars by the toilet and in the tub and shower.  Use non-skid mats or decals in the tub or shower.  Place a plastic non-slip stool in the shower to sit on, if needed.  Keep floors dry and clean up all water on the floor immediately.  Remove soap buildup in the tub or shower on a regular basis.  Secure bath mats with non-slip, double-sided rug tape.  Remove throw rugs and tripping hazards from the floors. BEDROOMS  Install night lights.  Make sure a bedside light is easy to reach.  Do not use oversized bedding.  Keep a telephone by your bedside.  Have a firm chair with side arms to use for getting dressed.  Remove throw rugs and tripping hazards from the floor. KITCHEN  Keep handles on pots and pans turned toward the center of the stove. Use back burners when possible.  Clean up spills quickly and allow time for drying.  Avoid walking on wet floors.  Avoid hot utensils and  knives.  Position shelves so they are not too high or low.  Place commonly used objects within easy reach.  If necessary, use a sturdy step stool with a grab bar when reaching.  Keep electrical cables out of the way.  Do not use floor polish or wax that makes floors slippery. If you must use wax, use non-skid floor wax.  Remove throw rugs and tripping hazards from the floor. STAIRWAYS  Never leave objects on stairs.  Place handrails on both sides of stairways and use them. Fix any loose handrails. Make sure handrails on both sides of the stairways are as long as the stairs.  Check carpeting to make sure it is firmly attached along stairs. Make repairs to worn or loose carpet promptly.  Avoid placing throw rugs at the top or bottom of stairways, or properly secure the rug with carpet tape to prevent slippage. Get rid of throw rugs, if possible.  Have an electrician put in a light switch at the top and bottom of the stairs. OTHER FALL PREVENTION TIPS  Wear low-heel or rubber-soled shoes that are supportive and fit well. Wear closed toe shoes.  When using a stepladder, make sure it is fully opened and both spreaders are firmly locked. Do not climb a closed stepladder.  Add color or contrast paint or tape to grab bars and handrails in your home. Place contrasting color strips on  first and last steps.  Learn and use mobility aids as needed. Install an electrical emergency response system.  Turn on lights to avoid dark areas. Replace light bulbs that burn out immediately. Get light switches that glow.  Arrange furniture to create clear pathways. Keep furniture in the same place.  Firmly attach carpet with non-skid or double-sided tape.  Eliminate uneven floor surfaces.  Select a carpet pattern that does not visually hide the edge of steps.  Be aware of all pets. OTHER HOME SAFETY TIPS  Set the water temperature for 120 F (48.8 C).  Keep emergency numbers on or near the  telephone.  Keep smoke detectors on every level of the home and near sleeping areas. Document Released: 09/13/2002 Document Revised: 03/24/2012 Document Reviewed: 12/13/2011 [facility] Patient Information 2015 [place], [place]. This information is not intended to replace advice given to you by your health care provider. Make sure you discuss any questions you have with your health care provider.   Preventive Care for Adults Ages 59 and over  Blood pressure check.** / Every 1 to 2 years.  Lipid and cholesterol check.**/ Every 5 years beginning at age 32.  Lung cancer screening. / Every year if you are aged 55-80 years and have a 30-pack-year history of smoking and currently smoke or have quit within the past 15 years. Yearly screening is stopped once you have quit smoking for at least 15 years or develop a health problem that would prevent you from having lung cancer treatment.  Fecal occult blood test (FOBT) of stool. / Every year beginning at age 45 and continuing until age 75. You may not have to do this test if you get a colonoscopy every 10 years.  Flexible sigmoidoscopy** or colonoscopy.** / Every 5 years for a flexible sigmoidoscopy or every 10 years for a colonoscopy beginning at age 20 and continuing until age 57.  Hepatitis C blood test.** / For all people born from [birth year] through 1964 and any individual with known risks for hepatitis C.  Abdominal aortic aneurysm (AAA) screening.** / A one-time screening for ages 82 to 65 years who are current or former smokers.  Skin self-exam. / Monthly.  Influenza vaccine. / Every year.  Tetanus, diphtheria, and acellular pertussis (Tdap/Td) vaccine.** / 1 dose of Td every 10 years.  Varicella vaccine.** / Consult your health care provider.  Zoster vaccine.** / 1 dose for adults aged 31 years or older.  Pneumococcal 13-valent conjugate (PCV13) vaccine.** / Consult your health care provider.  Pneumococcal polysaccharide (PPSV23) vaccine.**  / 1 dose for all adults aged 59 years and older.  Meningococcal vaccine.** / Consult your health care provider.  Hepatitis A vaccine.** / Consult your health care provider.  Hepatitis B vaccine.** / Consult your health care provider.  Haemophilus influenzae type b (Hib) vaccine.** / Consult your health care provider. **Family history and personal history of risk and conditions may change your health care provider's recommendations. Document Released: 11/19/2001 Document Revised: 09/28/2013 Document Reviewed: 02/18/2011 [facility] Patient Information 2015 [place], [place]. This information is not intended to replace advice given to you by your health care provider. Make sure you discuss any questions you have with your health care provider.

## 2015-06-01 NOTE — Assessment & Plan Note (Signed)
Labs

## 2015-06-01 NOTE — Assessment & Plan Note (Signed)
Asymptomatic, continue with aspirin and Lipitor

## 2015-06-01 NOTE — Progress Notes (Signed)
Subjective:    Patient ID: Tyler Ortega, male    DOB: Sep 29, 1941, 74 y.o.   MRN: [id number]  DOS:  06/01/2015 Type of visit - description :  Here for Medicare AWV:  1. Risk factors based on Past M, S, F history: reviewed 2. Physical Activities:  Goes to the gym, works part time 3. Depression/mood: neg screening  4. Hearing:  No problems noted or reported  5. ADL's: independent, drives , mobility increase after a round of PT 6. Fall Risk: no recent falls, prevention discussed , see AVS 7. home Safety: does feel safe at home  8. Height, weight, & visual acuity: see VS, sees eye doctor regulalrly 9. Counseling: provided 10. Labs ordered based on risk factors: if needed  11. Referral Coordination: if needed 12. Care Plan, see assessment and plan , written personalized plan provided , see AVS 13. Cognitive Assessment: motor skills and cognition appropriate for age 1. Care team updated  15. End-of-life care discussed , has a HC -POA  In addition, today we discussed the following:  CAD: Sees Dr. Prince Rome, asymptomatic. Good compliance with aspirin High cholesterol medication Hypertension: Dr. Prince Rome, cardiology recommended to dc metoprolol. Has noticed some decreasing his height. Feels well, no back pain. He remains very active  Review of Systems Constitutional: No fever. No chills. No unexplained wt changes. No unusual sweats  HEENT: No dental problems, no ear discharge, no facial swelling, no voice changes. No eye discharge, no eye  redness , no  intolerance to light   Respiratory: No wheezing , no  difficulty breathing. No cough , no mucus production  Cardiovascular: No CP, no leg swelling , no  Palpitations  GI: no nausea, no vomiting, no diarrhea , no  abdominal pain.  No blood in the stools. No dysphagia, no odynophagia    Endocrine: No polyphagia, no polyuria , no polydipsia  GU: No dysuria, gross hematuria, difficulty urinating. No urinary urgency, no  frequency.  Musculoskeletal: No joint swellings or unusual aches or pains  Skin: No change in the color of the skin, palor , no  Rash  Allergic, immunologic: No environmental allergies , no  food allergies  Neurological: No dizziness no  syncope. No headaches. No diplopia, no slurred, no slurred speech, no motor deficits, no facial  Numbness  Hematological: No enlarged lymph nodes, no easy bruising , no unusual bleedings  Psychiatry: No suicidal ideas, no hallucinations, no beavior problems, no confusion.  No unusual/severe anxiety, no depression   Past Medical History  Diagnosis Date  . Hypertension   . Hyperlipidemia   . CAD (coronary artery disease)     MI 2011, seen @ [name], sees Public librarian (once a year)  . Skin cancer     R calf and the ear (?side)    Past Surgical History  Procedure Laterality Date  . Inguinal hernia repair    . Hip surgery      3 L hip (initial hip replacement bleed)  . Coronary artery bypass graft      05-2010  . Colonoscopy  05/29/11    Normal    Social History   Social History  . Marital Status: Married    Spouse Name: N/A  . Number of Children: 3  . Years of Education: N/A   Occupational History  . Armed forces operational officer, semi retired    .     Social History Main Topics  . Smoking status: Never Smoker   . Smokeless tobacco: Never Used  .  Alcohol Use: Yes     Comment: rarely   . Drug Use: No  . Sexual Activity: Not on file   Other Topics Concern  . Not on file   Social History Narrative   1 Daughter lives in [place], lives w/ wife         Family History  Problem Relation Age of Onset  . Coronary artery disease Neg Hx   . Diabetes Neg Hx   . Colon cancer Neg Hx   . Prostate cancer Neg Hx        Medication List       This list is accurate as of: 06/01/15 12:50 PM.  Always use your most recent med list.               aspirin 325 MG EC tablet  Take 325 mg by mouth daily.     atorvastatin 40 MG tablet  Commonly known  as:  LIPITOR  TAKE 1 TABLET EVERY DAY     zoster vaccine live (PF) 19400 UNT/0.65ML injection  Commonly known as:  ZOSTAVAX  Inject 19,400 Units into the skin once.           Objective:   Physical Exam BP 122/64 mmHg  Pulse 65  Temp(Src) 97.6 F (36.4 C) (Oral)  Ht 5' 8.25" (1.734 m)  Wt 174 lb (78.926 kg)  BMI 26.25 kg/m2  SpO2 97%  General:   Well developed, well nourished . NAD.  HEENT:  Normocephalic . Face symmetric, atraumatic Neck: No thyromegaly, normal carotid pulses Lungs:  CTA B Normal respiratory effort, no intercostal retractions, no accessory muscle use. Heart: RRR,  no murmur.  no pretibial edema bilaterally  Abdomen:  Not distended, soft, non-tender. No rebound or rigidity. No bruit  Skin: Not pale. Not jaundice Neurologic:  alert & oriented X3.  Speech normal, gait appropriate for age and unassisted Psych--  Cognition and judgment appear intact.  Cooperative with normal attention span and concentration.  Behavior appropriate. No anxious or depressed appearing.    Assessment & Plan:

## 2015-08-01 ENCOUNTER — Encounter: Payer: Self-pay | Admitting: Internal Medicine

## 2015-08-01 ENCOUNTER — Telehealth: Payer: Self-pay

## 2015-08-01 ENCOUNTER — Ambulatory Visit (INDEPENDENT_AMBULATORY_CARE_PROVIDER_SITE_OTHER): Payer: Medicare Other | Admitting: Internal Medicine

## 2015-08-01 VITALS — BP 118/74 | HR 67 | Temp 98.1°F | Ht 68.0 in | Wt 173.1 lb

## 2015-08-01 DIAGNOSIS — Z23 Encounter for immunization: Secondary | ICD-10-CM

## 2015-08-01 DIAGNOSIS — Z09 Encounter for follow-up examination after completed treatment for conditions other than malignant neoplasm: Secondary | ICD-10-CM

## 2015-08-01 DIAGNOSIS — I25810 Atherosclerosis of coronary artery bypass graft(s) without angina pectoris: Secondary | ICD-10-CM | POA: Diagnosis not present

## 2015-08-01 NOTE — Telephone Encounter (Signed)
Received fax confirmation on 08/01/2015 at 3:33 PM.

## 2015-08-01 NOTE — Progress Notes (Signed)
Pre visit review using our clinic review tool, if applicable. No additional management support is needed unless otherwise documented below in the visit note. 

## 2015-08-01 NOTE — Patient Instructions (Signed)
Your are cleared for surgery from our standpoint.  Please contact Dr. Prince Rome for cardiac clearance.

## 2015-08-01 NOTE — Telephone Encounter (Signed)
Surgical Clearance form received by Pt today at [facility]. Surgical clearance signed and faxed with OV notes and EKG. Pt cleared for surgery from PCP stand-point, however, Pt needs needs cardiac clearance from Dr. Prince Rome at [facility]. Surgical clearance form sent for scanning.

## 2015-08-01 NOTE — Progress Notes (Signed)
Subjective:    Patient ID: Tyler Ortega, male    DOB: 1940/12/03, 74 y.o.   MRN: [id number]  DOS:  08/01/2015 Type of visit - description : Surgical clearance Interval history: Patient needs a hip replacement scheduled for December, needs clearance.   Review of Systems Good compliance of medication. He is active, denies chest pain, difficulty breathing. No lower extremity edema or orthopnea No nausea, vomiting, diarrhea blood in the stools. No history of unusual bleedings.   Past Medical History  Diagnosis Date  . Hypertension   . Hyperlipidemia   . CAD (coronary artery disease)     MI 2011, seen @ [name], sees Public librarian (once a year)  . Skin cancer     R calf and the ear (?side)    Past Surgical History  Procedure Laterality Date  . Inguinal hernia repair    . Hip surgery      3 L hip (initial hip replacement bleed)  . Coronary artery bypass graft      05-2010  . Colonoscopy  05/29/11    Normal    Social History   Social History  . Marital Status: Married    Spouse Name: N/A  . Number of Children: 3  . Years of Education: N/A   Occupational History  . Armed forces operational officer, semi retired    .     Social History Main Topics  . Smoking status: Never Smoker   . Smokeless tobacco: Never Used  . Alcohol Use: Yes     Comment: rarely   . Drug Use: No  . Sexual Activity: Not on file   Other Topics Concern  . Not on file   Social History Narrative   1 Daughter lives in [place], lives w/ wife            Medication List       This list is accurate as of: 08/01/15 11:59 PM.  Always use your most recent med list.               aspirin 325 MG EC tablet  Take 325 mg by mouth daily.     atorvastatin 40 MG tablet  Commonly known as:  LIPITOR  TAKE 1 TABLET EVERY DAY     zoster vaccine live (PF) 19400 UNT/0.65ML injection  Commonly known as:  ZOSTAVAX  Inject 19,400 Units into the skin once.           Objective:   Physical Exam BP 118/74  mmHg  Pulse 67  Temp(Src) 98.1 F (36.7 C) (Oral)  Ht 5\' 8"  (1.727 m)  Wt 173 lb 2 oz (78.529 kg)  BMI 26.33 kg/m2  SpO2 97% General:   Well developed, well nourished . NAD.  HEENT:  Normocephalic . Face symmetric, atraumat Lungs:  CTA B Normal respiratory effort, no intercostal retractions, no accessory muscle use. Heart: RRR,  no murmur.  no pretibial edema bilaterally  Abdomen:  Not distended, soft, non-tender. No rebound or rigidity. No mass,organomegaly Skin: Not pale. Not jaundice Neurologic:  alert & oriented X3.  Speech normal, gait appropriate for age and unassisted Psych--  Cognition and judgment appear intact.  Cooperative with normal attention span and concentration.  Behavior appropriate. No anxious or depressed appearing.    Assessment & Plan:   Assessment > HTN Hyperlipidemia CAD: MI and CABG 2011, cardiology Dr Prince Rome   Plan: CAD: Asymptomatic. EKG today sinus rhythm, first-degree AV block Preop: he seems to be doing great, labs reviewed and very  good; he is clear from the  medical standpoint but needs cardiac clearance from Dr. Prince Rome, reports he had a stress test few  months ago.

## 2015-08-02 DIAGNOSIS — Z09 Encounter for follow-up examination after completed treatment for conditions other than malignant neoplasm: Secondary | ICD-10-CM | POA: Insufficient documentation

## 2015-08-02 NOTE — Assessment & Plan Note (Signed)
CAD: Asymptomatic. EKG today sinus rhythm, first-degree AV block Preop: he seems to be doing great, labs reviewed and very good; he is clear from the  medical standpoint but needs cardiac clearance from Dr. Prince Rome, reports he had a stress test few  months ago.

## 2015-08-09 ENCOUNTER — Ambulatory Visit: Payer: Medicare Other | Admitting: Internal Medicine

## 2015-08-24 ENCOUNTER — Encounter: Payer: Self-pay | Admitting: Internal Medicine

## 2015-08-24 NOTE — Progress Notes (Signed)
Refaxed compy of labwork to [facility] per patient request.

## 2015-08-29 ENCOUNTER — Encounter: Payer: Self-pay | Admitting: Internal Medicine

## 2015-08-29 ENCOUNTER — Ambulatory Visit (INDEPENDENT_AMBULATORY_CARE_PROVIDER_SITE_OTHER): Payer: Medicare Other | Admitting: Internal Medicine

## 2015-08-29 ENCOUNTER — Ambulatory Visit (HOSPITAL_BASED_OUTPATIENT_CLINIC_OR_DEPARTMENT_OTHER)
Admission: RE | Admit: 2015-08-29 | Discharge: 2015-08-29 | Disposition: A | Discharge status: Home / Self Care | Payer: Medicare Other | Source: Ambulatory Visit | Attending: Internal Medicine | Admitting: Internal Medicine

## 2015-08-29 VITALS — BP 126/78 | HR 68 | Temp 97.6°F | Ht 68.0 in | Wt 177.4 lb

## 2015-08-29 DIAGNOSIS — M546 Pain in thoracic spine: Secondary | ICD-10-CM | POA: Diagnosis not present

## 2015-08-29 DIAGNOSIS — M5134 Other intervertebral disc degeneration, thoracic region: Secondary | ICD-10-CM | POA: Diagnosis not present

## 2015-08-29 DIAGNOSIS — Z09 Encounter for follow-up examination after completed treatment for conditions other than malignant neoplasm: Secondary | ICD-10-CM

## 2015-08-29 MED ORDER — PREDNISONE 10 MG PO TABS: ORAL_TABLET | ORAL | Status: DC

## 2015-08-29 MED ORDER — HYDROCODONE-ACETAMINOPHEN 5-325 MG PO TABS: 1.0000 | ORAL_TABLET | Freq: Three times a day (TID) | ORAL | Status: DC | PRN

## 2015-08-29 MED ORDER — METHOCARBAMOL 750 MG PO TABS: 750.0000 mg | ORAL_TABLET | Freq: Three times a day (TID) | ORAL | Status: DC | PRN

## 2015-08-29 NOTE — Progress Notes (Signed)
Subjective:    Patient ID: Tyler Ortega, male    DOB: 10-29-1940, 74 y.o.   MRN: [id number]  DOS:  08/29/2015 Type of visit - description : Acute visit Interval history: Symptoms started a week ago with pain at the right side of the thoracic spine on and off, usually worse when he lays down, he has to roll over to get out of. No radiation, Advil helps, has been taking several a day. During the daytime when he is active and standing up he has no major problems.    Review of Systems Denies any fever chills No recent injury or over-doing. No rash. No dysuria, gross hematuria.   Past Medical History  Diagnosis Date  . Hypertension   . Hyperlipidemia   . CAD (coronary artery disease)     MI 2011, seen @ [name], sees Public librarian (once a year)  . Skin cancer     R calf and the ear (?side)    Past Surgical History  Procedure Laterality Date  . Inguinal hernia repair    . Hip surgery      3 L hip (initial hip replacement bleed)  . Coronary artery bypass graft      05-2010  . Colonoscopy  05/29/11    Normal    Social History   Social History  . Marital Status: Married    Spouse Name: N/A  . Number of Children: 3  . Years of Education: N/A   Occupational History  . Armed forces operational officer, semi retired    .     Social History Main Topics  . Smoking status: Never Smoker   . Smokeless tobacco: Never Used  . Alcohol Use: Yes     Comment: rarely   . Drug Use: No  . Sexual Activity: Not on file   Other Topics Concern  . Not on file   Social History Narrative   1 Daughter lives in [place], lives w/ wife            Medication List       This list is accurate as of: 08/29/15  7:37 PM.  Always use your most recent med list.               aspirin 325 MG EC tablet  Take 325 mg by mouth daily.     atorvastatin 40 MG tablet  Commonly known as:  LIPITOR  TAKE 1 TABLET EVERY DAY     HYDROcodone-acetaminophen 5-325 MG tablet  Commonly known as:  NORCO/VICODIN    Take 1-2 tablets by mouth every 8 (eight) hours as needed.     methocarbamol 750 MG tablet  Commonly known as:  ROBAXIN-750  Take 1 tablet (750 mg total) by mouth every 8 (eight) hours as needed for muscle spasms.     predniSONE 10 MG tablet  Commonly known as:  DELTASONE  4 tablets x 2 days, 3 tabs x 2 days, 2 tabs x 2 days, 1 tab x 2 days     zoster vaccine live (PF) 19400 UNT/0.65ML injection  Commonly known as:  ZOSTAVAX  Inject 19,400 Units into the skin once.           Objective:   Physical Exam BP 126/78 mmHg  Pulse 68  Temp(Src) 97.6 F (36.4 C) (Oral)  Ht 5\' 8"  (1.727 m)  Wt 177 lb 6 oz (80.457 kg)  BMI 26.98 kg/m2  SpO2 98% General:   Well developed, well nourished . NAD.  HEENT:  Normocephalic . Face symmetric, atraumatic Abdomen: Soft, no TTP. MSK: No TTP at the thoracic and lumbar spine Skin: Not pale. Not jaundice Neurologic:  alert & oriented X3.  Speech normal, gait appropriate for age and unassisted.  Posture is not antalgic Except when he needs to get up from the examining table, he rolls to the side to prevent pain. Motor exam normal, DTRs symmetric, strength test leg negative  Psych--  Cognition and judgment appear intact.  Cooperative with normal attention span and concentration.  Behavior appropriate. No anxious or depressed appearing.      Assessment & Plan:   Assessment > HTN Hyperlipidemia CAD: MI and CABG 2011, cardiology Dr Prince Rome   Plan: Thoracic pain: Clearly MSK problem, neurological exam normal, no rash or urinary symptoms. We'll get an x-ray, rx prednisone, Tylenol, a muscle relaxant, avoid NSAIDs.see AVS he has a hip surgery scheduled for December 6, will see his surgeon tomorrow, encouraged to discuss this problem with him.

## 2015-08-29 NOTE — Patient Instructions (Signed)
Get a x-ray  Prednisone for a few days  For pain: Tylenol  500 mg OTC 2 tabs a day every 8 hours as needed  If the pain is intense, take hydrocodone instead (it has Tylenol on it already)  Robaxin, a muscle relaxant, will make you sleepy, most people take it at night  Heating pad  If not improving in the next few days let me know  Notify your orthopedic doctor about your pain and prednisone use

## 2015-08-29 NOTE — Assessment & Plan Note (Signed)
Thoracic pain: Clearly MSK problem, neurological exam normal, no rash or urinary symptoms. We'll get an x-ray, rx prednisone, Tylenol, a muscle relaxant, avoid NSAIDs.see AVS he has a hip surgery scheduled for December 6, will see his surgeon tomorrow, encouraged to discuss this problem with him.

## 2015-08-29 NOTE — Progress Notes (Signed)
Pre visit review using our clinic review tool, if applicable. No additional management support is needed unless otherwise documented below in the visit note. 

## 2015-08-30 NOTE — Patient Instructions (Addendum)
YOUR PROCEDURE IS SCHEDULED ON :  09/12/15  REPORT TO [facility] MAIN ENTRANCE FOLLOW SIGNS TO EAST ELEVATOR - GO TO 3rd FLOOR CHECK IN AT 3 EAST NURSES STATION (SHORT STAY) AT: 5:15 AM  CALL THIS NUMBER IF YOU HAVE PROBLEMS THE MORNING OF SURGERY [phone]  REMEMBER:ONLY 1 PER PERSON MAY GO TO SHORT STAY WITH YOU TO GET READY THE MORNING OF YOUR SURGERY  DO NOT EAT FOOD OR DRINK LIQUIDS AFTER MIDNIGHT  TAKE THESE MEDICINES THE MORNING OF SURGERY: Atorvastatin.  YOU MAY NOT HAVE ANY METAL ON YOUR BODY INCLUDING HAIR PINS AND PIERCING'S. DO NOT WEAR JEWELRY, MAKEUP, LOTIONS, POWDERS OR PERFUMES. DO NOT WEAR NAIL POLISH. DO NOT SHAVE 48 HRS PRIOR TO SURGERY. MEN MAY SHAVE FACE AND NECK.  DO NOT [facility]. [facility] IS NOT RESPONSIBLE FOR VALUABLES.  CONTACTS, DENTURES OR PARTIALS MAY NOT BE WORN TO SURGERY. LEAVE SUITCASE IN CAR. CAN BE BROUGHT TO ROOM AFTER SURGERY.  PATIENTS DISCHARGED THE DAY OF SURGERY WILL NOT BE ALLOWED TO DRIVE HOME.  PLEASE READ OVER THE FOLLOWING INSTRUCTION SHEETS _________________________________________________________________________________                                          [facility] - PREPARING FOR SURGERY  Before surgery, you can play an important role.  Because skin is not sterile, your skin needs to be as free of germs as possible.  You can reduce the number of germs on your skin by washing with CHG (chlorahexidine gluconate) soap before surgery.  CHG is an antiseptic cleaner which kills germs and bonds with the skin to continue killing germs even after washing. Please DO NOT use if you have an allergy to CHG or antibacterial soaps.  If your skin becomes reddened/irritated stop using the CHG and inform your nurse when you arrive at Short Stay. Do not shave (including legs and underarms) for at least 48 hours prior to the first CHG shower.  You may shave your face. Please follow these instructions  carefully:   1.  Shower with CHG Soap the night before surgery and the  morning of Surgery.   2.  If you choose to wash your hair, wash your hair first as usual with your  normal  Shampoo.   3.  After you shampoo, rinse your hair and body thoroughly to remove the  shampoo.                                         4.  Use CHG as you would any other liquid soap.  You can apply chg directly  to the skin and wash . Gently wash with scrungie or clean wascloth    5.  Apply the CHG Soap to your body ONLY FROM THE NECK DOWN.   Do not use on open                           Wound or open sores. Avoid contact with eyes, ears mouth and genitals (private parts).                        Genitals (private parts) with your normal soap.  6.  Wash thoroughly, paying special attention to the area where your surgery  will be performed.   7.  Thoroughly rinse your body with warm water from the neck down.   8.  DO NOT shower/wash with your normal soap after using and rinsing off  the CHG Soap .                9.  Pat yourself dry with a clean towel.             10.  Wear clean night clothes to bed after shower             11.  Place clean sheets on your bed the night of your first shower and do not  sleep with pets.  Day of Surgery : Do not apply any lotions/deodorants the morning of surgery.  Please wear clean clothes to the hospital/surgery center.  FAILURE TO FOLLOW THESE INSTRUCTIONS MAY RESULT IN THE CANCELLATION OF YOUR SURGERY    PATIENT SIGNATURE_________________________________  ______________________________________________________________________     Tyler Ortega  An incentive spirometer is a tool that can help keep your lungs clear and active. This tool measures how well you are filling your lungs with each breath. Taking long deep breaths may help reverse or decrease the chance of developing breathing (pulmonary) problems (especially infection) following:  A long  period of time when you are unable to move or be active. BEFORE THE PROCEDURE   If the spirometer includes an indicator to show your best effort, your nurse or respiratory therapist will set it to a desired goal.  If possible, sit up straight or lean slightly forward. Try not to slouch.  Hold the incentive spirometer in an upright position. INSTRUCTIONS FOR USE   Sit on the edge of your bed if possible, or sit up as far as you can in bed or on a chair.  Hold the incentive spirometer in an upright position.  Breathe out normally.  Place the mouthpiece in your mouth and seal your lips tightly around it.  Breathe in slowly and as deeply as possible, raising the piston or the ball toward the top of the column.  Hold your breath for 3-5 seconds or for as long as possible. Allow the piston or ball to fall to the bottom of the column.  Remove the mouthpiece from your mouth and breathe out normally.  Rest for a few seconds and repeat Steps 1 through 7 at least 10 times every 1-2 hours when you are awake. Take your time and take a few normal breaths between deep breaths.  The spirometer may include an indicator to show your best effort. Use the indicator as a goal to work toward during each repetition.  After each set of 10 deep breaths, practice coughing to be sure your lungs are clear. If you have an incision (the cut made at the time of surgery), support your incision when coughing by placing a pillow or rolled up towels firmly against it. Once you are able to get out of bed, walk around indoors and cough well. You may stop using the incentive spirometer when instructed by your caregiver.  RISKS AND COMPLICATIONS  Take your time so you do not get dizzy or light-headed.  If you are in pain, you may need to take or ask for pain medication before doing incentive spirometry. It is harder to take a deep breath if you are having pain. AFTER USE  Rest and breathe slowly and easily.  It can  be helpful to keep track of a log of your progress. Your caregiver can provide you with a simple table to help with this. If you are using the spirometer at home, follow these instructions: [place] IF:   You are having difficultly using the spirometer.  You have trouble using the spirometer as often as instructed.  Your pain medication is not giving enough relief while using the spirometer.  You develop fever of 100.5 F (38.1 C) or higher. SEEK IMMEDIATE MEDICAL CARE IF:   You cough up bloody sputum that had not been present before.  You develop fever of 102 F (38.9 C) or greater.  You develop worsening pain at or near the incision site. MAKE SURE YOU:   Understand these instructions.  Will watch your condition.  Will get help right away if you are not doing well or get worse. Document Released: 02/03/2007 Document Revised: 12/16/2011 Document Reviewed: 04/06/2007 Exit[employer] Patient Information 2014 [employer], [place].   ________________________________________________________________________  WHAT IS A BLOOD TRANSFUSION? Blood Transfusion Information  A transfusion is the replacement of blood or some of its parts. Blood is made up of multiple cells which provide different functions.  Red blood cells carry oxygen and are used for blood loss replacement.  White blood cells fight against infection.  Platelets control bleeding.  Plasma helps clot blood.  Other blood products are available for specialized needs, such as hemophilia or other clotting disorders. BEFORE THE TRANSFUSION  Who gives blood for transfusions?   Healthy volunteers who are fully evaluated to make sure their blood is safe. This is blood bank blood. Transfusion therapy is the safest it has ever been in the practice of medicine. Before blood is taken from a donor, a complete history is taken to make sure that person has no history of diseases nor engages in risky social behavior (examples are  intravenous drug use or sexual activity with multiple partners). The donor's travel history is screened to minimize risk of transmitting infections, such as malaria. The donated blood is tested for signs of infectious diseases, such as HIV and hepatitis. The blood is then tested to be sure it is compatible with you in order to minimize the chance of a transfusion reaction. If you or a relative donates blood, this is often done in anticipation of surgery and is not appropriate for emergency situations. It takes many days to process the donated blood. RISKS AND COMPLICATIONS Although transfusion therapy is very safe and saves many lives, the main dangers of transfusion include:   Getting an infectious disease.  Developing a transfusion reaction. This is an allergic reaction to something in the blood you were given. Every precaution is taken to prevent this. The decision to have a blood transfusion has been considered carefully by your caregiver before blood is given. Blood is not given unless the benefits outweigh the risks. AFTER THE TRANSFUSION  Right after receiving a blood transfusion, you will usually feel much better and more energetic. This is especially true if your red blood cells have gotten low (anemic). The transfusion raises the level of the red blood cells which carry oxygen, and this usually causes an energy increase.  The nurse administering the transfusion will monitor you carefully for complications. HOME CARE INSTRUCTIONS  No special instructions are needed after a transfusion. You may find your energy is better. Speak with your caregiver about any limitations on activity for underlying diseases you may have. SEEK MEDICAL CARE IF:   Your  condition is not improving after your transfusion.  You develop redness or irritation at the intravenous (IV) site. SEEK IMMEDIATE MEDICAL CARE IF:  Any of the following symptoms occur over the next 12 hours:  Shaking chills.  You have a  temperature by mouth above 102 F (38.9 C), not controlled by medicine.  Chest, back, or muscle pain.  People around you feel you are not acting correctly or are confused.  Shortness of breath or difficulty breathing.  Dizziness and fainting.  You get a rash or develop hives.  You have a decrease in urine output.  Your urine turns a dark color or changes to pink, red, or brown. Any of the following symptoms occur over the next 10 days:  You have a temperature by mouth above 102 F (38.9 C), not controlled by medicine.  Shortness of breath.  Weakness after normal activity.  The white part of the eye turns yellow (jaundice).  You have a decrease in the amount of urine or are urinating less often.  Your urine turns a dark color or changes to pink, red, or brown. Document Released: 09/20/2000 Document Revised: 12/16/2011 Document Reviewed: 05/09/2008 [facility] Patient Information 2014 [place], [place].  _______________________________________________________________________

## 2015-09-05 ENCOUNTER — Encounter (HOSPITAL_COMMUNITY)
Admission: RE | Admit: 2015-09-05 | Discharge: 2015-09-05 | Disposition: A | Discharge status: Home / Self Care | Payer: Medicare Other | Source: Ambulatory Visit | Attending: Orthopedic Surgery | Admitting: Orthopedic Surgery

## 2015-09-05 ENCOUNTER — Encounter (HOSPITAL_COMMUNITY): Payer: Self-pay

## 2015-09-05 DIAGNOSIS — Z01818 Encounter for other preprocedural examination: Secondary | ICD-10-CM | POA: Insufficient documentation

## 2015-09-05 DIAGNOSIS — M1611 Unilateral primary osteoarthritis, right hip: Secondary | ICD-10-CM | POA: Insufficient documentation

## 2015-09-05 HISTORY — DX: Personal history of other medical treatment: Z92.89

## 2015-09-05 HISTORY — DX: Unspecified osteoarthritis, unspecified site: M19.90

## 2015-09-05 LAB — URINALYSIS, ROUTINE W REFLEX MICROSCOPIC
Bilirubin Urine: NEGATIVE
Glucose, UA: NEGATIVE mg/dL
Hgb urine dipstick: NEGATIVE
Ketones, ur: NEGATIVE mg/dL
Leukocytes, UA: NEGATIVE
Nitrite: NEGATIVE
Protein, ur: NEGATIVE mg/dL
Specific Gravity, Urine: 1.018 (ref 1.005–1.030)
pH: 6.5 (ref 5.0–8.0)

## 2015-09-05 LAB — CBC
HCT: 42.2 % (ref 39.0–52.0)
Hemoglobin: 14.1 g/dL (ref 13.0–17.0)
MCH: 32.3 pg (ref 26.0–34.0)
MCHC: 33.4 g/dL (ref 30.0–36.0)
MCV: 96.8 fL (ref 78.0–100.0)
Platelets: 180 10*3/uL (ref 150–400)
RBC: 4.36 MIL/uL (ref 4.22–5.81)
RDW: 13.9 % (ref 11.5–15.5)
WBC: 7.2 10*3/uL (ref 4.0–10.5)

## 2015-09-05 LAB — APTT: aPTT: 29 seconds (ref 24–37)

## 2015-09-05 LAB — BASIC METABOLIC PANEL
Anion gap: 7 (ref 5–15)
BUN: 24 mg/dL — ABNORMAL HIGH (ref 6–20)
CO2: 27 mmol/L (ref 22–32)
Calcium: 9 mg/dL (ref 8.9–10.3)
Chloride: 105 mmol/L (ref 101–111)
Creatinine, Ser: 1.2 mg/dL (ref 0.61–1.24)
GFR calc Af Amer: >60 mL/min (ref >60)
GFR calc non Af Amer: 58 mL/min — ABNORMAL LOW (ref >60)
Glucose, Bld: 108 mg/dL — ABNORMAL HIGH (ref 65–99)
Potassium: 4.4 mmol/L (ref 3.5–5.1)
Sodium: 139 mmol/L (ref 135–145)

## 2015-09-05 LAB — ABO/RH: ABO/RH(D): O POS

## 2015-09-05 LAB — PROTIME-INR
INR: 0.98 (ref 0.00–1.49)
Prothrombin Time: 13.2 seconds (ref 11.6–15.2)

## 2015-09-05 LAB — SURGICAL PCR SCREEN
MRSA, PCR: NEGATIVE
Staphylococcus aureus: POSITIVE — AB

## 2015-09-05 NOTE — Pre-Procedure Instructions (Signed)
EKG 08-25-15 with chart.Stress report 11'16 with chart- clearance note(Dr. Powers).LOV notes -[name] for clearance, with chart.

## 2015-09-06 NOTE — Pre-Procedure Instructions (Signed)
09-06-15 1305 Positive Staph aureus- pt. Left message Mupirocin Ointment called to [facility]- use as directed [id number] appllications before surgery. Note sent per Epic fax [phone] Dr. Alvan Dame.

## 2015-09-07 NOTE — H&P (Signed)
TOTAL HIP ADMISSION H&P  Patient is admitted for right total hip arthroplasty, anterior approach.  Subjective:  Chief Complaint: Right hip primary OA / pain  HPI: Tyler Ortega, 74 y.o. male, has a history of pain and functional disability in the right hip(s) due to arthritis and patient has failed non-surgical conservative treatments for greater than 12 weeks to include NSAID's and/or analgesics, corticosteriod injections and activity modification.  Onset of symptoms was gradual starting years ago with gradually worsening course since that time.The patient noted no past surgery on the right hip(s).  Patient currently rates pain in the right hip at 9 out of 10 with activity. Patient has worsening of pain with activity and weight bearing, trendelenberg gait, pain that interfers with activities of daily living and pain with passive range of motion. Patient has evidence of periarticular osteophytes and joint space narrowing by imaging studies. This condition presents safety issues increasing the risk of falls.  There is no current active infection.  Risks, benefits and expectations were discussed with the patient.  Risks including but not limited to the risk of anesthesia, blood clots, nerve damage, blood vessel damage, failure of the prosthesis, infection and up to and including death.  Patient understand the risks, benefits and expectations and wishes to proceed with surgery.   PCP: [name], MD  D/C Plans:      Home with HHPT  Post-op Meds:       No Rx given   Tranexamic Acid:      To be given - IV  Decadron:      Is to be given  FYI:     ASA  Norco    Patient Active Problem List   Diagnosis Date Noted  . PCP NOTES >>> 08/02/2015  . H/O coronary artery bypass surgery 08/19/2014  . Annual physical exam 04/25/2011  . CAD (coronary artery disease)   . Hyperlipidemia   . Hypertension   . CARCINOMA, SKIN, SQUAMOUS CELL 09/10/2007   Past Medical History  Diagnosis Date  . Hypertension    . Hyperlipidemia   . CAD (coronary artery disease)     MI 2011, seen @ [name], sees Public librarian (once a year)  . Skin cancer     R calf and the ear (?side)  . Arthritis     osteoarthritis -hips, back pain tx with Prednisone-last dose 09-04-15.  . Transfusion history     2nd Hip surgery    Past Surgical History  Procedure Laterality Date  . Inguinal hernia repair Left   . Hip surgery      x3 - L hip (initial hip replacement bleed)  . Colonoscopy  05/29/11    Normal  . Coronary artery bypass graft      05-2010-x4 vessel [facility]  . Vasectomy      No prescriptions prior to admission   No Known Allergies   Social History  Substance Use Topics  . Smoking status: Never Smoker   . Smokeless tobacco: Never Used  . Alcohol Use: Yes     Comment: rarely     Family History  Problem Relation Age of Onset  . Coronary artery disease Neg Hx   . Diabetes Neg Hx   . Colon cancer Neg Hx   . Prostate cancer Neg Hx      Review of Systems  Constitutional: Negative.   HENT: Negative.   Eyes: Negative.   Respiratory: Negative.   Cardiovascular: Negative.   Gastrointestinal: Negative.   Genitourinary: Negative.  Musculoskeletal: Positive for joint pain.  Skin: Negative.   Neurological: Negative.   Endo/Heme/Allergies: Negative.   Psychiatric/Behavioral: Negative.     Objective:  Physical Exam  Constitutional: He is oriented to person, place, and time. He appears well-developed and well-nourished.  HENT:  Head: Normocephalic and atraumatic.  Eyes: Pupils are equal, round, and reactive to light.  Neck: Neck supple. No JVD present. No tracheal deviation present. No thyromegaly present.  Cardiovascular: Normal rate, regular rhythm, normal heart sounds and intact distal pulses.   Respiratory: Effort normal and breath sounds normal. No stridor. No respiratory distress. He has no wheezes.  GI: Soft. There is no tenderness. There is no guarding.  Musculoskeletal:        Right hip: He exhibits decreased range of motion, decreased strength, tenderness and bony tenderness. He exhibits no swelling, no deformity and no laceration.  Lymphadenopathy:    He has no cervical adenopathy.  Neurological: He is alert and oriented to person, place, and time.  Skin: Skin is warm and dry.  Psychiatric: He has a normal mood and affect.      Labs:  Estimated body mass index is 26.25 kg/(m^2) as calculated from the following:   Height as of 06/01/15: 5' 8.25" (1.734 m).   Weight as of 06/01/15: 78.926 kg (174 lb).   Imaging Review Plain radiographs demonstrate severe degenerative joint disease of the right hip(s). The bone quality appears to be good for age and reported activity level.  Assessment/Plan:  End stage arthritis, right hip(s)  The patient history, physical examination, clinical judgement of the provider and imaging studies are consistent with end stage degenerative joint disease of the right hip(s) and total hip arthroplasty is deemed medically necessary. The treatment options including medical management, injection therapy, arthroscopy and arthroplasty were discussed at length. The risks and benefits of total hip arthroplasty were presented and reviewed. The risks due to aseptic loosening, infection, stiffness, dislocation/subluxation,  thromboembolic complications and other imponderables were discussed.  The patient acknowledged the explanation, agreed to proceed with the plan and consent was signed. Patient is being admitted for inpatient treatment for surgery, pain control, PT, OT, prophylactic antibiotics, VTE prophylaxis, progressive ambulation and ADL's and discharge planning.The patient is planning to be discharged home with home health services.     Tyler Pugh Yaira Bernardi   PA-C  09/07/2015, 8:51 AM

## 2015-09-11 NOTE — Anesthesia Preprocedure Evaluation (Addendum)
Anesthesia Evaluation  Patient identified by MRN, date of birth, ID band Patient awake    Reviewed: Allergy & Precautions, H&P , NPO status , Patient's Chart, lab work & pertinent test results  Airway Mallampati: III  TM Distance: >3 FB Neck ROM: Full    Dental no notable dental hx. (+) Teeth Intact, Dental Advisory Given   Pulmonary neg pulmonary ROS,    Pulmonary exam normal breath sounds clear to auscultation       Cardiovascular hypertension, Pt. on medications + CAD and + CABG   Rhythm:Regular Rate:Normal     Neuro/Psych negative neurological ROS  negative psych ROS   GI/Hepatic negative GI ROS, Neg liver ROS,   Endo/Other  negative endocrine ROS  Renal/GU negative Renal ROS  negative genitourinary   Musculoskeletal  (+) Arthritis , Osteoarthritis,    Abdominal   Peds  Hematology negative hematology ROS (+)   Anesthesia Other Findings   Reproductive/Obstetrics negative OB ROS                           Anesthesia Physical Anesthesia Plan  ASA: III  Anesthesia Plan: MAC and Spinal   Post-op Pain Management:    Induction: Intravenous  Airway Management Planned: Simple Face Mask  Additional Equipment:   Intra-op Plan:   Post-operative Plan:   Informed Consent: I have reviewed the patients History and Physical, chart, labs and discussed the procedure including the risks, benefits and alternatives for the proposed anesthesia with the patient or authorized representative who has indicated his/her understanding and acceptance.   Dental advisory given  Plan Discussed with: CRNA  Anesthesia Plan Comments:         Anesthesia Quick Evaluation

## 2015-09-12 ENCOUNTER — Inpatient Hospital Stay (HOSPITAL_COMMUNITY): Payer: Medicare Other

## 2015-09-12 ENCOUNTER — Encounter (HOSPITAL_COMMUNITY)
Admission: RE | Disposition: A | Discharge status: Home Health | Payer: Self-pay | Source: Ambulatory Visit | Attending: Orthopedic Surgery

## 2015-09-12 ENCOUNTER — Encounter (HOSPITAL_COMMUNITY): Payer: Self-pay | Admitting: *Deleted

## 2015-09-12 ENCOUNTER — Inpatient Hospital Stay (HOSPITAL_COMMUNITY)
Admission: RE | Admit: 2015-09-12 | Discharge: 2015-09-13 | DRG: 470 | Disposition: A | Discharge status: Home Health | Payer: Medicare Other | Source: Ambulatory Visit | Attending: Orthopedic Surgery | Admitting: Orthopedic Surgery

## 2015-09-12 ENCOUNTER — Inpatient Hospital Stay (HOSPITAL_COMMUNITY): Payer: Medicare Other | Admitting: Anesthesiology

## 2015-09-12 DIAGNOSIS — M25551 Pain in right hip: Secondary | ICD-10-CM | POA: Diagnosis present

## 2015-09-12 DIAGNOSIS — Z96642 Presence of left artificial hip joint: Secondary | ICD-10-CM | POA: Diagnosis present

## 2015-09-12 DIAGNOSIS — E663 Overweight: Secondary | ICD-10-CM | POA: Diagnosis present

## 2015-09-12 DIAGNOSIS — M1611 Unilateral primary osteoarthritis, right hip: Secondary | ICD-10-CM | POA: Diagnosis present

## 2015-09-12 DIAGNOSIS — Z951 Presence of aortocoronary bypass graft: Secondary | ICD-10-CM

## 2015-09-12 DIAGNOSIS — I2510 Atherosclerotic heart disease of native coronary artery without angina pectoris: Secondary | ICD-10-CM | POA: Diagnosis present

## 2015-09-12 DIAGNOSIS — Z01812 Encounter for preprocedural laboratory examination: Secondary | ICD-10-CM | POA: Diagnosis not present

## 2015-09-12 DIAGNOSIS — Z6826 Body mass index (BMI) 26.0-26.9, adult: Secondary | ICD-10-CM

## 2015-09-12 DIAGNOSIS — Z79899 Other long term (current) drug therapy: Secondary | ICD-10-CM | POA: Diagnosis not present

## 2015-09-12 DIAGNOSIS — I10 Essential (primary) hypertension: Secondary | ICD-10-CM | POA: Diagnosis present

## 2015-09-12 DIAGNOSIS — Z96649 Presence of unspecified artificial hip joint: Secondary | ICD-10-CM

## 2015-09-12 HISTORY — PX: TOTAL HIP ARTHROPLASTY: SHX124

## 2015-09-12 LAB — TYPE AND SCREEN
ABO/RH(D): O POS
Antibody Screen: NEGATIVE

## 2015-09-12 IMAGING — DX DG HIP (WITH OR WITHOUT PELVIS) 1V PORT*R*
2 series · 2 of 2 positions shown · non-contrast
Comparison: [REDACTED] pelvis radiograph
[DATE] and left hip series [DATE] reports (no images
available).

CLINICAL DATA: 74-year-old male status post right hip surgery.
Initial encounter.

EXAM:
DG HIP (WITH OR WITHOUT PELVIS) 1V PORT RIGHT

[pelvis ap]
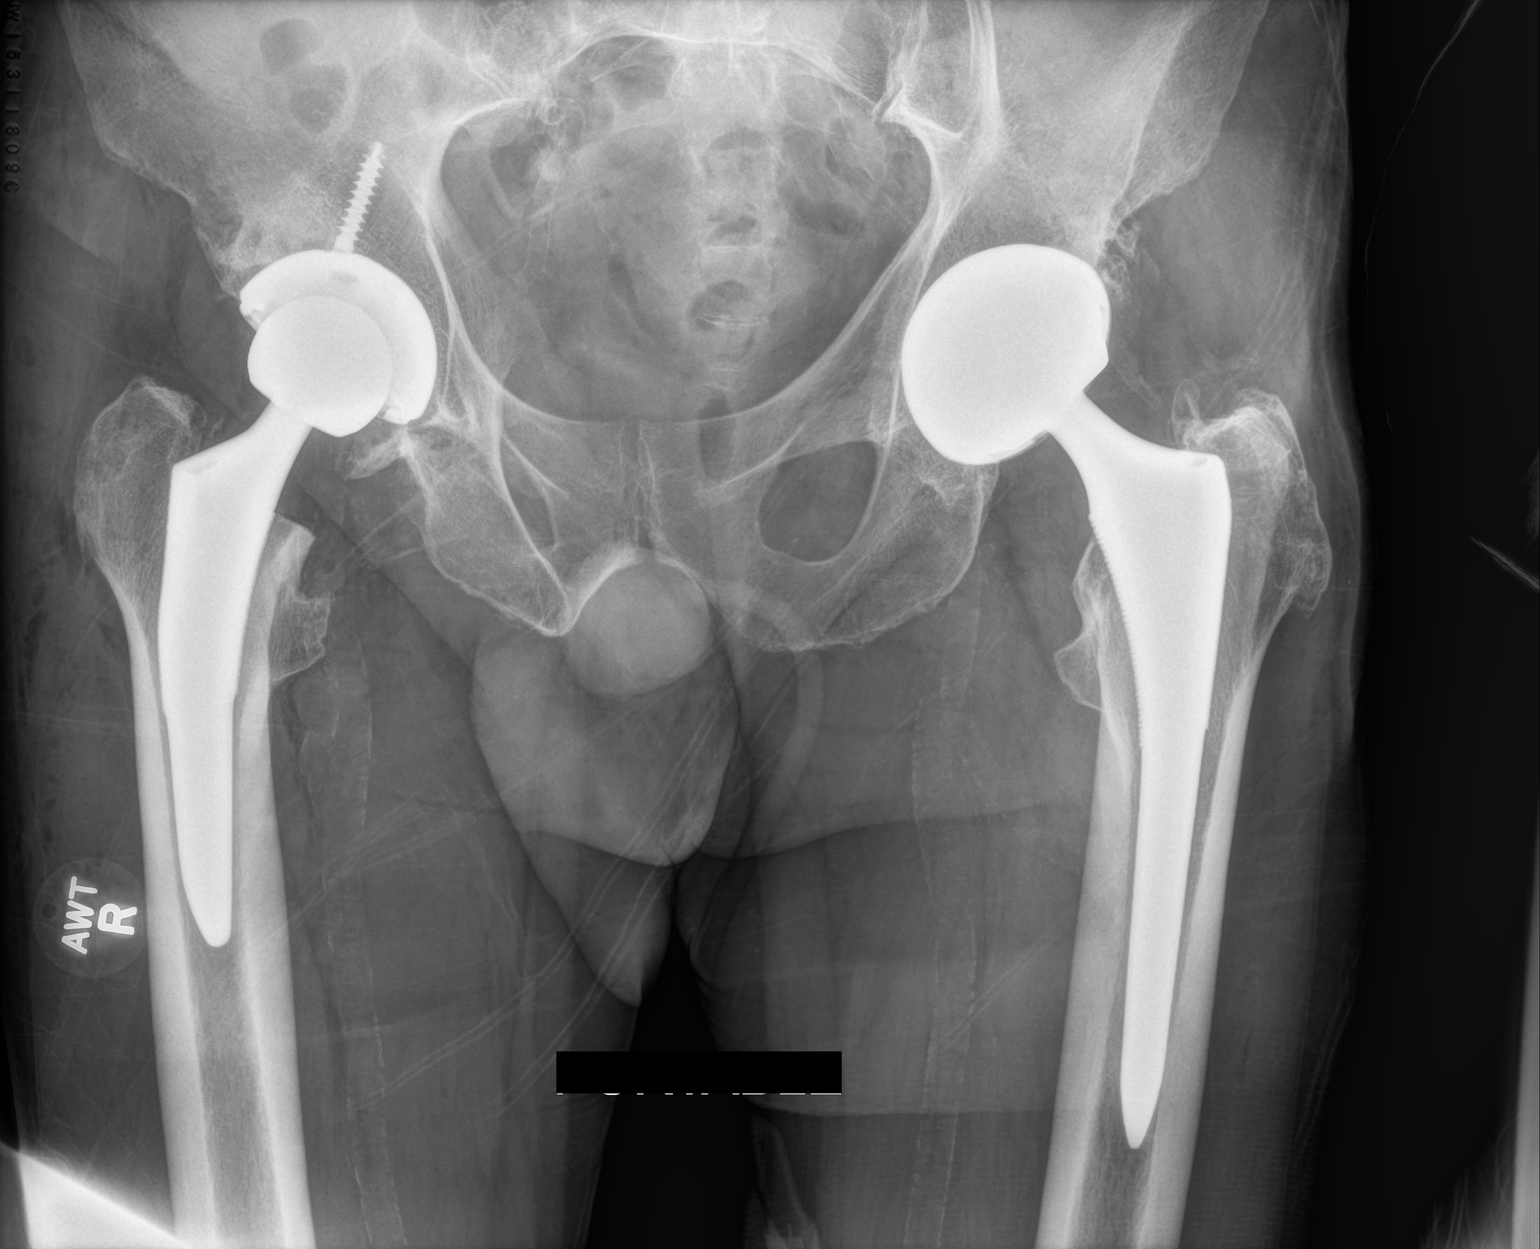

[hip x-table]
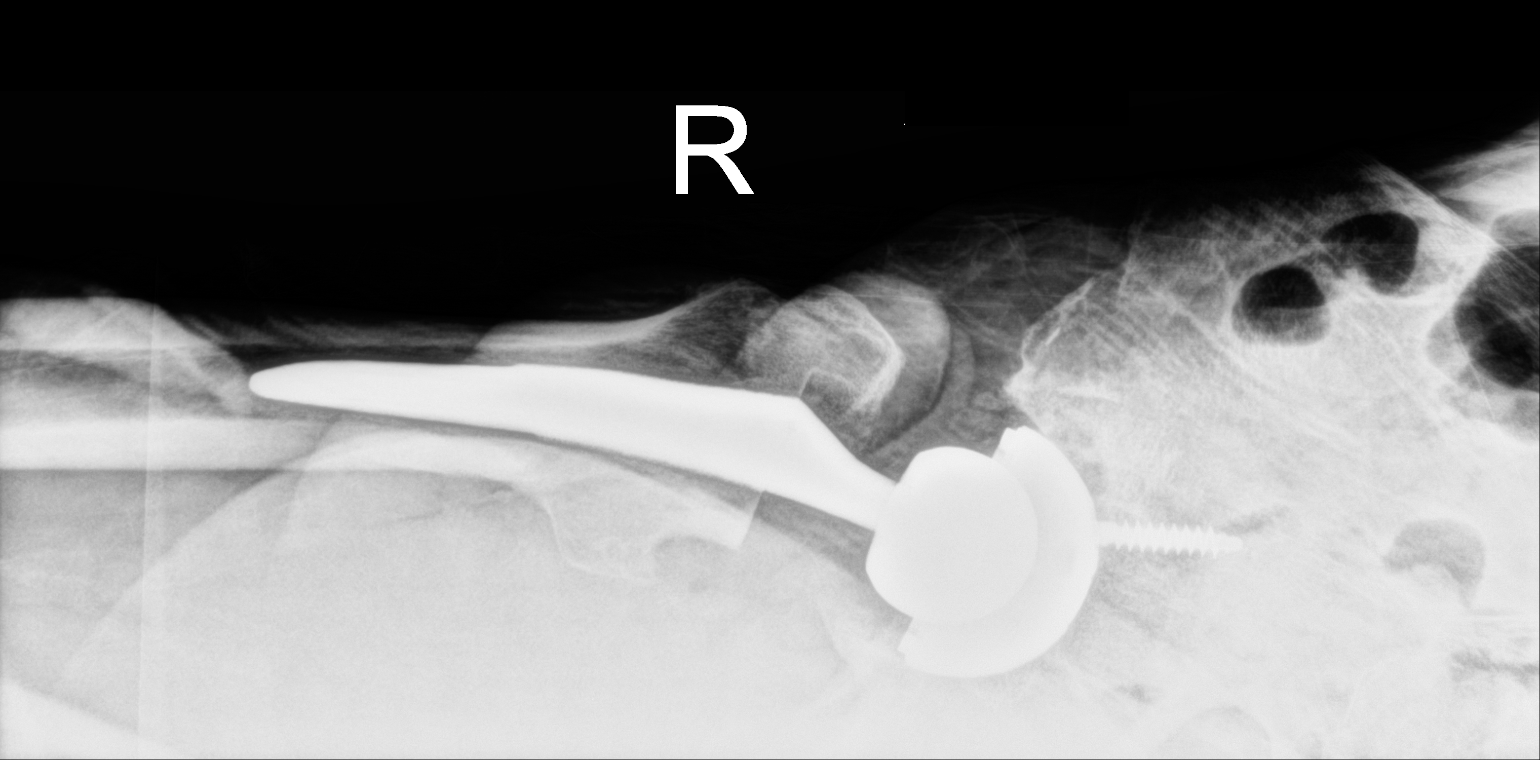

[2 of 2 positions shown; findings below may reference images not displayed]

FINDINGS: Portable AP and cross-table lateral views of the right hip and
pelvis. Right total hip arthroplasty in place. Hardware intact and
normally aligned. Surrounding postoperative changes to the soft
tissues. Left hip arthroplasty also in place. No unexpected osseous
changes. Bilateral pelvic and femoral artery calcified
atherosclerosis.
IMPRESSION: Right total hip arthroplasty with no adverse features.

## 2015-09-12 SURGERY — ARTHROPLASTY, HIP, TOTAL, ANTERIOR APPROACH
Anesthesia: Monitor Anesthesia Care | Site: Hip | Laterality: Right

## 2015-09-12 MED ORDER — METOCLOPRAMIDE HCL 5 MG/ML IJ SOLN
INTRAMUSCULAR | Status: AC
  Filled 2015-09-12: qty 2

## 2015-09-12 MED ORDER — ROCURONIUM BROMIDE 100 MG/10ML IV SOLN
INTRAVENOUS | Status: AC
  Filled 2015-09-12: qty 1

## 2015-09-12 MED ORDER — SUGAMMADEX SODIUM 500 MG/5ML IV SOLN
INTRAVENOUS | Status: DC | PRN
  Administered 2015-09-12: 400 mg via INTRAVENOUS

## 2015-09-12 MED ORDER — METOCLOPRAMIDE HCL 5 MG/ML IJ SOLN
INTRAMUSCULAR | Status: DC | PRN
  Administered 2015-09-12: 10 mg via INTRAVENOUS

## 2015-09-12 MED ORDER — DOCUSATE SODIUM 100 MG PO CAPS
100.0000 mg | ORAL_CAPSULE | Freq: Two times a day (BID) | ORAL | Status: DC
  Administered 2015-09-12 – 2015-09-13 (×2): 100 mg via ORAL

## 2015-09-12 MED ORDER — ONDANSETRON HCL 4 MG/2ML IJ SOLN
INTRAMUSCULAR | Status: DC | PRN
  Administered 2015-09-12: 4 mg via INTRAVENOUS

## 2015-09-12 MED ORDER — HYDROMORPHONE HCL 1 MG/ML IJ SOLN
INTRAMUSCULAR | Status: AC
  Filled 2015-09-12: qty 1

## 2015-09-12 MED ORDER — DIPHENHYDRAMINE HCL 25 MG PO CAPS: 25.0000 mg | ORAL_CAPSULE | Freq: Four times a day (QID) | ORAL | Status: DC | PRN

## 2015-09-12 MED ORDER — PHENOL 1.4 % MT LIQD: 1.0000 | OROMUCOSAL | Status: DC | PRN

## 2015-09-12 MED ORDER — METOPROLOL TARTRATE 1 MG/ML IV SOLN
INTRAVENOUS | Status: DC | PRN
  Administered 2015-09-12: 1 mg via INTRAVENOUS
  Administered 2015-09-12: 2 mg via INTRAVENOUS

## 2015-09-12 MED ORDER — LACTATED RINGERS IV SOLN
INTRAVENOUS | Status: DC | PRN
  Administered 2015-09-12 (×2): via INTRAVENOUS

## 2015-09-12 MED ORDER — LIDOCAINE HCL (CARDIAC) 20 MG/ML IV SOLN
INTRAVENOUS | Status: AC
  Filled 2015-09-12: qty 5

## 2015-09-12 MED ORDER — PROPOFOL 10 MG/ML IV BOLUS
INTRAVENOUS | Status: DC | PRN
  Administered 2015-09-12: 150 mg via INTRAVENOUS
  Administered 2015-09-12: 30 mg via INTRAVENOUS
  Administered 2015-09-12: 50 mg via INTRAVENOUS

## 2015-09-12 MED ORDER — HYDRALAZINE HCL 20 MG/ML IJ SOLN
INTRAMUSCULAR | Status: AC
  Filled 2015-09-12: qty 1

## 2015-09-12 MED ORDER — PROPOFOL 10 MG/ML IV BOLUS
INTRAVENOUS | Status: AC
  Filled 2015-09-12: qty 60

## 2015-09-12 MED ORDER — ONDANSETRON HCL 4 MG PO TABS: 4.0000 mg | ORAL_TABLET | Freq: Four times a day (QID) | ORAL | Status: DC | PRN

## 2015-09-12 MED ORDER — CEFAZOLIN SODIUM-DEXTROSE 2-3 GM-% IV SOLR
2.0000 g | INTRAVENOUS | Status: AC
  Administered 2015-09-12: 2 g via INTRAVENOUS

## 2015-09-12 MED ORDER — ASPIRIN EC 325 MG PO TBEC
325.0000 mg | DELAYED_RELEASE_TABLET | Freq: Two times a day (BID) | ORAL | Status: DC
  Administered 2015-09-13: 325 mg via ORAL
  Filled 2015-09-12 (×3): qty 1

## 2015-09-12 MED ORDER — HYDRALAZINE HCL 20 MG/ML IJ SOLN
INTRAMUSCULAR | Status: DC | PRN
  Administered 2015-09-12: 2 mg via INTRAVENOUS

## 2015-09-12 MED ORDER — METHOCARBAMOL 500 MG PO TABS
500.0000 mg | ORAL_TABLET | Freq: Four times a day (QID) | ORAL | Status: DC | PRN
  Administered 2015-09-12 – 2015-09-13 (×2): 500 mg via ORAL
  Filled 2015-09-12 (×2): qty 1

## 2015-09-12 MED ORDER — LIDOCAINE HCL (CARDIAC) 20 MG/ML IV SOLN
INTRAVENOUS | Status: DC | PRN
Start: 2015-09-12 — End: 2015-09-12
  Administered 2015-09-12: 50 mg via INTRAVENOUS

## 2015-09-12 MED ORDER — SODIUM CHLORIDE 0.9 % IR SOLN
Status: DC | PRN
  Administered 2015-09-12: 1000 mL

## 2015-09-12 MED ORDER — DEXAMETHASONE SODIUM PHOSPHATE 10 MG/ML IJ SOLN: 10.0000 mg | Freq: Once | INTRAMUSCULAR | Status: DC

## 2015-09-12 MED ORDER — DEXAMETHASONE SODIUM PHOSPHATE 10 MG/ML IJ SOLN
INTRAMUSCULAR | Status: AC
  Filled 2015-09-12: qty 1

## 2015-09-12 MED ORDER — ONDANSETRON HCL 4 MG/2ML IJ SOLN: 4.0000 mg | Freq: Four times a day (QID) | INTRAMUSCULAR | Status: DC | PRN

## 2015-09-12 MED ORDER — ALUM & MAG HYDROXIDE-SIMETH 200-200-20 MG/5ML PO SUSP: 30.0000 mL | ORAL | Status: DC | PRN

## 2015-09-12 MED ORDER — FENTANYL CITRATE (PF) 250 MCG/5ML IJ SOLN
INTRAMUSCULAR | Status: AC
  Filled 2015-09-12: qty 5

## 2015-09-12 MED ORDER — FERROUS SULFATE 325 (65 FE) MG PO TABS
325.0000 mg | ORAL_TABLET | Freq: Three times a day (TID) | ORAL | Status: DC
  Administered 2015-09-12 – 2015-09-13 (×2): 325 mg via ORAL
  Filled 2015-09-12 (×6): qty 1

## 2015-09-12 MED ORDER — METHOCARBAMOL 1000 MG/10ML IJ SOLN
500.0000 mg | Freq: Four times a day (QID) | INTRAVENOUS | Status: DC | PRN
  Administered 2015-09-12: 500 mg via INTRAVENOUS
  Filled 2015-09-12 (×2): qty 5

## 2015-09-12 MED ORDER — SUCCINYLCHOLINE CHLORIDE 20 MG/ML IJ SOLN
INTRAMUSCULAR | Status: DC | PRN
  Administered 2015-09-12: 100 mg via INTRAVENOUS

## 2015-09-12 MED ORDER — METOCLOPRAMIDE HCL 10 MG PO TABS: 5.0000 mg | ORAL_TABLET | Freq: Three times a day (TID) | ORAL | Status: DC | PRN

## 2015-09-12 MED ORDER — PHENYLEPHRINE HCL 10 MG/ML IJ SOLN
INTRAMUSCULAR | Status: DC | PRN
  Administered 2015-09-12: 80 ug via INTRAVENOUS
  Administered 2015-09-12: 40 ug via INTRAVENOUS
  Administered 2015-09-12 (×2): 80 ug via INTRAVENOUS
  Administered 2015-09-12: 40 ug via INTRAVENOUS

## 2015-09-12 MED ORDER — CELECOXIB 200 MG PO CAPS
200.0000 mg | ORAL_CAPSULE | Freq: Two times a day (BID) | ORAL | Status: DC
  Administered 2015-09-12 – 2015-09-13 (×2): 200 mg via ORAL
  Filled 2015-09-12 (×3): qty 1

## 2015-09-12 MED ORDER — CEFAZOLIN SODIUM-DEXTROSE 2-3 GM-% IV SOLR
2.0000 g | Freq: Four times a day (QID) | INTRAVENOUS | Status: AC
  Administered 2015-09-12 (×2): 2 g via INTRAVENOUS
  Filled 2015-09-12 (×2): qty 50

## 2015-09-12 MED ORDER — BISACODYL 10 MG RE SUPP: 10.0000 mg | Freq: Every day | RECTAL | Status: DC | PRN

## 2015-09-12 MED ORDER — ONDANSETRON HCL 4 MG/2ML IJ SOLN
INTRAMUSCULAR | Status: AC
  Filled 2015-09-12: qty 2

## 2015-09-12 MED ORDER — POLYETHYLENE GLYCOL 3350 17 G PO PACK
17.0000 g | PACK | Freq: Two times a day (BID) | ORAL | Status: DC
  Administered 2015-09-13: 17 g via ORAL

## 2015-09-12 MED ORDER — GLYCOPYRROLATE 0.2 MG/ML IJ SOLN
INTRAMUSCULAR | Status: AC
  Filled 2015-09-12: qty 1

## 2015-09-12 MED ORDER — DEXAMETHASONE SODIUM PHOSPHATE 10 MG/ML IJ SOLN
10.0000 mg | Freq: Once | INTRAMUSCULAR | Status: AC
  Administered 2015-09-12: 10 mg via INTRAVENOUS

## 2015-09-12 MED ORDER — HYDROMORPHONE HCL 1 MG/ML IJ SOLN: 0.5000 mg | INTRAMUSCULAR | Status: DC | PRN

## 2015-09-12 MED ORDER — CHLORHEXIDINE GLUCONATE 4 % EX LIQD: 60.0000 mL | Freq: Once | CUTANEOUS | Status: DC

## 2015-09-12 MED ORDER — PHENYLEPHRINE 40 MCG/ML (10ML) SYRINGE FOR IV PUSH (FOR BLOOD PRESSURE SUPPORT)
PREFILLED_SYRINGE | INTRAVENOUS | Status: AC
  Filled 2015-09-12: qty 10

## 2015-09-12 MED ORDER — METOCLOPRAMIDE HCL 5 MG/ML IJ SOLN: 5.0000 mg | Freq: Three times a day (TID) | INTRAMUSCULAR | Status: DC | PRN

## 2015-09-12 MED ORDER — METOPROLOL TARTRATE 1 MG/ML IV SOLN
INTRAVENOUS | Status: AC
  Filled 2015-09-12: qty 5

## 2015-09-12 MED ORDER — MENTHOL 3 MG MT LOZG: 1.0000 | LOZENGE | OROMUCOSAL | Status: DC | PRN

## 2015-09-12 MED ORDER — FENTANYL CITRATE (PF) 100 MCG/2ML IJ SOLN
INTRAMUSCULAR | Status: DC | PRN
  Administered 2015-09-12: 100 ug via INTRAVENOUS
  Administered 2015-09-12 (×3): 50 ug via INTRAVENOUS

## 2015-09-12 MED ORDER — POTASSIUM CHLORIDE 2 MEQ/ML IV SOLN
100.0000 mL/h | INTRAVENOUS | Status: DC
  Administered 2015-09-12: 100 mL/h via INTRAVENOUS
  Filled 2015-09-12 (×5): qty 1000

## 2015-09-12 MED ORDER — CEFAZOLIN SODIUM-DEXTROSE 2-3 GM-% IV SOLR
INTRAVENOUS | Status: AC
  Filled 2015-09-12: qty 50

## 2015-09-12 MED ORDER — ATORVASTATIN CALCIUM 40 MG PO TABS
40.0000 mg | ORAL_TABLET | Freq: Every day | ORAL | Status: DC
  Administered 2015-09-13: 40 mg via ORAL
  Filled 2015-09-12: qty 1

## 2015-09-12 MED ORDER — ROCURONIUM BROMIDE 100 MG/10ML IV SOLN
INTRAVENOUS | Status: DC | PRN
  Administered 2015-09-12: 20 mg via INTRAVENOUS
  Administered 2015-09-12: 30 mg via INTRAVENOUS

## 2015-09-12 MED ORDER — HYDROMORPHONE HCL 1 MG/ML IJ SOLN
0.2500 mg | INTRAMUSCULAR | Status: DC | PRN
  Administered 2015-09-12 (×6): 0.5 mg via INTRAVENOUS

## 2015-09-12 MED ORDER — HYDROCODONE-ACETAMINOPHEN 7.5-325 MG PO TABS
1.0000 | ORAL_TABLET | ORAL | Status: DC
  Administered 2015-09-12: 1 via ORAL
  Administered 2015-09-12: 2 via ORAL
  Administered 2015-09-12 (×2): 1 via ORAL
  Administered 2015-09-13: 2 via ORAL
  Administered 2015-09-13: 1 via ORAL
  Administered 2015-09-13: 2 via ORAL
  Filled 2015-09-12: qty 1
  Filled 2015-09-12 (×5): qty 2
  Filled 2015-09-12: qty 1

## 2015-09-12 MED ORDER — SUGAMMADEX SODIUM 500 MG/5ML IV SOLN
INTRAVENOUS | Status: AC
  Filled 2015-09-12: qty 5

## 2015-09-12 MED ORDER — TRANEXAMIC ACID 1000 MG/10ML IV SOLN
1000.0000 mg | Freq: Once | INTRAVENOUS | Status: AC
  Administered 2015-09-12: 1000 mg via INTRAVENOUS
  Filled 2015-09-12: qty 10

## 2015-09-12 MED ORDER — MAGNESIUM CITRATE PO SOLN: 1.0000 | Freq: Once | ORAL | Status: DC | PRN

## 2015-09-12 MED ORDER — GLYCOPYRROLATE 0.2 MG/ML IJ SOLN
INTRAMUSCULAR | Status: DC | PRN
  Administered 2015-09-12: 0.1 mg via INTRAVENOUS

## 2015-09-12 SURGICAL SUPPLY — 33 items
BAG DECANTER FOR FLEXI CONT (MISCELLANEOUS) IMPLANT
BAG SPEC THK2 15X12 ZIP CLS (MISCELLANEOUS)
BAG ZIPLOCK 12X15 (MISCELLANEOUS) IMPLANT
CAPT HIP TOTAL 2 ×1 IMPLANT
CLOTH BEACON ORANGE TIMEOUT ST (SAFETY) ×2 IMPLANT
COVER PERINEAL POST (MISCELLANEOUS) ×2 IMPLANT
DRAPE STERI IOBAN 125X83 (DRAPES) ×2 IMPLANT
DRAPE U-SHAPE 47X51 STRL (DRAPES) ×4 IMPLANT
DRSG AQUACEL AG ADV 3.5X10 (GAUZE/BANDAGES/DRESSINGS) ×2 IMPLANT
DURAPREP 26ML APPLICATOR (WOUND CARE) ×2 IMPLANT
ELECT REM PT RETURN 15FT ADLT (MISCELLANEOUS) IMPLANT
ELECT REM PT RETURN 9FT ADLT (ELECTROSURGICAL) ×2
ELECTRODE REM PT RTRN 9FT ADLT (ELECTROSURGICAL) ×1 IMPLANT
GLOVE BIOGEL M 7.0 STRL (GLOVE) IMPLANT
GLOVE BIOGEL PI IND STRL 7.5 (GLOVE) ×1 IMPLANT
GLOVE BIOGEL PI IND STRL 8.5 (GLOVE) ×1 IMPLANT
GLOVE BIOGEL PI INDICATOR 7.5 (GLOVE) ×1
GLOVE BIOGEL PI INDICATOR 8.5 (GLOVE) ×1
GLOVE ECLIPSE 8.0 STRL XLNG CF (GLOVE) ×4 IMPLANT
GLOVE ORTHO TXT STRL SZ7.5 (GLOVE) ×2 IMPLANT
GOWN STRL REUS W/TWL LRG LVL3 (GOWN DISPOSABLE) ×2 IMPLANT
GOWN STRL REUS W/TWL XL LVL3 (GOWN DISPOSABLE) ×2 IMPLANT
HOLDER FOLEY CATH W/STRAP (MISCELLANEOUS) ×2 IMPLANT
LIQUID BAND (GAUZE/BANDAGES/DRESSINGS) ×2 IMPLANT
PACK ANTERIOR HIP CUSTOM (KITS) ×2 IMPLANT
SAW OSC TIP CART 19.5X105X1.3 (SAW) ×2 IMPLANT
SUT MNCRL AB 4-0 PS2 18 (SUTURE) ×2 IMPLANT
SUT VIC AB 1 CT1 36 (SUTURE) ×6 IMPLANT
SUT VIC AB 2-0 CT1 27 (SUTURE) ×4
SUT VIC AB 2-0 CT1 TAPERPNT 27 (SUTURE) ×2 IMPLANT
SUT VLOC 180 0 24IN GS25 (SUTURE) ×2 IMPLANT
TRAY FOLEY W/METER SILVER 16FR (SET/KITS/TRAYS/PACK) ×1 IMPLANT
WATER STERILE IRR 1500ML POUR (IV SOLUTION) ×2 IMPLANT

## 2015-09-12 NOTE — Transfer of Care (Signed)
Immediate Anesthesia Transfer of Care Note  Patient: Tyler Ortega  Procedure(s) Performed: Procedure(s): RIGHT TOTAL HIP ARTHROPLASTY ANTERIOR APPROACH (Right)  Patient Location: PACU  Anesthesia Type:General  Level of Consciousness:  sedated, patient cooperative and responds to stimulation  Airway & Oxygen Therapy:Patient Spontanous Breathing and Patient connected to face mask oxgen  Post-op Assessment:  Report given to PACU RN and Post -op Vital signs reviewed and stable  Post vital signs:  Reviewed and stable  Last Vitals:  Filed Vitals:   09/12/15 0523  BP: 122/75  Pulse: 76  Temp: 36.8 C  Resp: 18    Complications: No apparent anesthesia complications

## 2015-09-12 NOTE — Addendum Note (Signed)
Addendum  created 09/12/15 1110 by Deliah Boston, CRNA   Modules edited: Anesthesia Flowsheet, Charges VN

## 2015-09-12 NOTE — Anesthesia Postprocedure Evaluation (Signed)
Anesthesia Post Note  Patient: DELONTE NEDEAU  Procedure(s) Performed: Procedure(s) (LRB): RIGHT TOTAL HIP ARTHROPLASTY ANTERIOR APPROACH (Right)  Patient location during evaluation: PACU Anesthesia Type: General Level of consciousness: awake and alert Pain management: pain level controlled Vital Signs Assessment: post-procedure vital signs reviewed and stable Respiratory status: spontaneous breathing, nonlabored ventilation, respiratory function stable and patient connected to nasal cannula oxygen Cardiovascular status: blood pressure returned to baseline and stable Postop Assessment: no signs of nausea or vomiting Anesthetic complications: no    Last Vitals:  Filed Vitals:   09/12/15 0945 09/12/15 1016  BP:  108/61  Pulse:  69  Temp: 36.6 C 36.6 C  Resp:  15    Last Pain:  Filed Vitals:   09/12/15 1020  PainSc: 3                  Sahiba Granholm,W. EDMOND

## 2015-09-12 NOTE — Interval H&P Note (Signed)
History and Physical Interval Note:  09/12/2015 6:52 AM  Tyler Ortega  has presented today for surgery, with the diagnosis of RIGHT HIP OA  The various methods of treatment have been discussed with the patient and family. After consideration of risks, benefits and other options for treatment, the patient has consented to  Procedure(s): RIGHT TOTAL HIP ARTHROPLASTY ANTERIOR APPROACH (Right) as a surgical intervention .  The patient's history has been reviewed, patient examined, no change in status, stable for surgery.  I have reviewed the patient's chart and labs.  Questions were answered to the patient's satisfaction.     Mauri Pole

## 2015-09-12 NOTE — Anesthesia Procedure Notes (Signed)
Procedure Name: Intubation Date/Time: 09/12/2015 7:36 AM Performed by: Deliah Boston Pre-anesthesia Checklist: Patient identified, Emergency Drugs available, Suction available and Patient being monitored Patient Re-evaluated:Patient Re-evaluated prior to inductionOxygen Delivery Method: Circle System Utilized Preoxygenation: Pre-oxygenation with 100% oxygen Intubation Type: IV induction Ventilation: Mask ventilation without difficulty Laryngoscope Size: Mac and 4 Grade View: Grade I Tube type: Oral Tube size: 7.5 mm Number of attempts: 1 Airway Equipment and Method: Stylet and Oral airway Placement Confirmation: ETT inserted through vocal cords under direct vision,  positive ETCO2 and breath sounds checked- equal and bilateral Secured at: 21 cm Tube secured with: Tape Dental Injury: Teeth and Oropharynx as per pre-operative assessment

## 2015-09-12 NOTE — Op Note (Signed)
NAME:  Tyler Ortega                ACCOUNT NO.: [id number]      MEDICAL RECORD NO.: [id number]      FACILITY:  [facility]      PHYSICIAN:  Paralee Cancel D  DATE OF BIRTH:  Oct 27, 1940     DATE OF PROCEDURE:  09/12/2015                                 OPERATIVE REPORT         PREOPERATIVE DIAGNOSIS: Right  hip osteoarthritis.      POSTOPERATIVE DIAGNOSIS:  Right hip osteoarthritis. History of previous left THR with revisions     PROCEDURE:  Right total hip replacement through an anterior approach   utilizing DePuy THR system, component size 40mm pinnacle cup, a size 36+4 neutral   Altrex liner, a size 7 Hi Tri Lock stem with a 36+1.5 delta ceramic   ball.      SURGEON:  Pietro Cassis. Alvan Dame, M.D.      ASSISTANT:  Danae Orleans, PA-C     ANESTHESIA:  General.      SPECIMENS:  None.      COMPLICATIONS:  None.      BLOOD LOSS:  500 cc     DRAINS:  None.      INDICATION OF THE PROCEDURE:  Tyler Ortega is a 74 y.o. male who had   presented to office for evaluation of right hip pain.  Radiographs revealed   progressive degenerative changes with bone-on-bone   articulation to the  hip joint.  The patient had painful limited range of   motion significantly affecting their overall quality of life.  The patient was failing to    respond to conservative measures, and at this point was ready   to proceed with more definitive measures.  The patient has noted progressive   degenerative changes in his hip, progressive problems and dysfunction   with regarding the hip prior to surgery.  Consent was obtained for   benefit of pain relief.  Specific risk of infection, DVT, component   failure, dislocation, need for revision surgery, as well discussion of   the anterior versus posterior approach were reviewed.  Consent was   obtained for benefit of anterior pain relief through an anterior   approach.      PROCEDURE IN DETAIL:  The patient was brought to operative  theater.   Once adequate anesthesia, preoperative antibiotics, 2gm of Ancef, 1 gm of Tranexamic Acid, and 10 mg of Decadron administered.   The patient was positioned supine on the OSI Hanna table.  Once adequate   padding of boney process was carried out, we had predraped out the hip, and  used fluoroscopy to confirm orientation of the pelvis and position.      The right hip was then prepped and draped from proximal iliac crest to   mid thigh with shower curtain technique.      Time-out was performed identifying the patient, planned procedure, and   extremity.     An incision was then made 2 cm distal and lateral to the   anterior superior iliac spine extending over the orientation of the   tensor fascia lata muscle and sharp dissection was carried down to the   fascia of the muscle and protractor placed in the soft tissues.  The fascia was then incised.  The muscle belly was identified and swept   laterally and retractor placed along the superior neck.  Following   cauterization of the circumflex vessels and removing some pericapsular   fat, a second cobra retractor was placed on the inferior neck.  A third   retractor was placed on the anterior acetabulum after elevating the   anterior rectus.  A L-capsulotomy was along the line of the   superior neck to the trochanteric fossa, then extended proximally and   distally.  Tag sutures were placed and the retractors were then placed   intracapsular.  We then identified the trochanteric fossa and   orientation of my neck cut, confirmed this radiographically   and then made a neck osteotomy with the femur on traction.  The femoral   head was removed without difficulty or complication.  Traction was let   off and retractors were placed posterior and anterior around the   acetabulum.      The labrum and foveal tissue were debrided.  I began reaming with a 37mm   reamer and reamed up to 72mm reamer with good bony bed preparation and a  21mm   cup was chosen.  The final 8mm Pinnacle cup was then impacted under fluoroscopy  to confirm the depth of penetration and orientation with respect to   abduction.  A screw was placed followed by the hole eliminator.  The final   36+4 neutral Altrex liner was impacted with good visualized rim fit.  The cup was positioned anatomically within the acetabular portion of the pelvis.      At this point, the femur was rolled at 80 degrees.  Further capsule was   released off the inferior aspect of the femoral neck.  I then   released the superior capsule proximally.  The hook was placed laterally   along the femur and elevated manually and held in position with the bed   hook.  The leg was then extended and adducted with the leg rolled to 100   degrees of external rotation.  Once the proximal femur was fully   exposed, I used a box osteotome to set orientation.  I then began   broaching with the starting chili pepper broach and passed this by hand and then broached up to 7.  With the 7 broach in place I chose a high offset neck and several trial reductions in order to best match the contralateral hip.  The offset was appropriate, leg lengths   appeared to be equal with the +1.5 head ball confirmed radiographically.   Given these findings, I went ahead and dislocated the hip, repositioned all   retractors and positioned the right hip in the extended and abducted position.  The final 7 Hi Tri Lock stem was   chosen and it was impacted down to the level of neck cut.  Based on this   and the trial reduction, a 36+1.5 delta ceramic ball was chosen and   impacted onto a clean and dry trunnion, and the hip was reduced.  The   hip had been irrigated throughout the case again at this point.  I did   reapproximate the superior capsular leaflet to the anterior leaflet   using #1 Vicryl.  The fascia of the   tensor fascia lata muscle was then reapproximated using #1 Vicryl and #0 V-lock sutures.  The    remaining wound was closed with 2-0 Vicryl and running 4-0 Monocryl.  The hip was cleaned, dried, and dressed sterilely using Dermabond and   Aquacel dressing.  He was then brought   to recovery room in stable condition tolerating the procedure well.    Danae Orleans, PA-C was present for the entirety of the case involved from   preoperative positioning, perioperative retractor management, general   facilitation of the case, as well as primary wound closure as assistant.            Pietro Cassis Alvan Dame, M.D.        09/12/2015 8:46 AM

## 2015-09-12 NOTE — Progress Notes (Signed)
Utilization review completed.  

## 2015-09-12 NOTE — Discharge Instructions (Signed)

## 2015-09-12 NOTE — Evaluation (Signed)
Physical Therapy Evaluation Patient Details Name: Tyler Ortega MRN: [id number] DOB: 03/26/1941 Today's Date: 09/12/2015   History of Present Illness  Tyler Ortega  Clinical Impression  Patient having sharp pains when moving to edge of bed, able to ambulate x 60'. Will require 2 sessions  Of PT tomorow. Highly recommend HHPT as bed and bath are on second level. Patient will benefit from PT to address problems listed in note to DC to home.    Follow Up Recommendations Home health PT;Supervision/Assistance - 24 hour    Equipment Recommendations  None recommended by PT    Recommendations for Other Services OT consult     Precautions / Restrictions Precautions Precautions: Fall      Mobility  Bed Mobility Overal bed mobility: Needs Assistance Bed Mobility: Supine to Sit;Sit to Supine     Supine to sit: Mod assist;HOB elevated Sit to supine: Mod assist;HOB elevated   General bed mobility comments: assist  with Tyler leg to edge and both legs onto the bed.  Transfers Overall transfer level: Needs assistance Equipment used: Rolling walker (2 wheeled) Transfers: Sit to/from Stand Sit to Stand: Min assist         General transfer comment: cues for hand and  Tyler leg position  Ambulation/Gait Ambulation/Gait assistance: Min assist Ambulation Distance (Feet): 60 Feet Assistive device: Rolling walker (2 wheeled) Gait Pattern/deviations: Step-to pattern;Antalgic;Decreased step length - right;Decreased stance time - right     General Gait Details: cues for sequence and posture, look ahead  Stairs            Wheelchair Mobility    Modified Rankin (Stroke Patients Only)       Balance                                             Pertinent Vitals/Pain Pain Assessment: 0-10 Pain Score: 6  Pain Location: spasming Tyler thigh Pain Descriptors / Indicators: Contraction;Cramping;Throbbing Pain Intervention(s): Limited activity within patient's  tolerance;Monitored during session;Premedicated before session;Patient requesting pain meds-RN notified;Ice applied;Repositioned    Home Living Family/patient expects to be discharged to:: Private residence Living Arrangements: Spouse/significant other   Type of Home: House Home Access: Stairs to enter   Technical brewer of Steps: 1 x 2 Home Layout: Two level;1/2 bath on main level;Bed/bath upstairs Home Equipment: Walker - 2 wheels      Prior Function Level of Independence: Independent               Hand Dominance        Extremity/Trunk Assessment   Upper Extremity Assessment: Defer to OT evaluation           Lower Extremity Assessment: RLE deficits/detail RLE Deficits / Details: initially had difficulty advancing the leg     Cervical / Trunk Assessment: Kyphotic  Communication   Communication: No difficulties  Cognition Arousal/Alertness: Awake/alert Behavior During Therapy: WFL for tasks assessed/performed Overall Cognitive Status: Within Functional Limits for tasks assessed                      General Comments      Exercises Total Joint Exercises Heel Slides: AAROM;Right;10 reps;Supine      Assessment/Plan    PT Assessment Patient needs continued PT services  PT Diagnosis Difficulty walking;Acute pain   PT Problem List Decreased strength;Decreased range of motion;Decreased activity tolerance;Decreased mobility;Decreased knowledge of  precautions;Decreased safety awareness;Decreased knowledge of use of DME;Pain  PT Treatment Interventions DME instruction;Gait training;Stair training;Functional mobility training;Therapeutic activities;Therapeutic exercise;Patient/family education   PT Goals (Current goals can be found in the Care Plan section) Acute Rehab PT Goals Patient Stated Goal: i want to walk PT Goal Formulation: With patient/family Time For Goal Achievement: 09/15/15 Potential to Achieve Goals: Good    Frequency 7X/week    Barriers to discharge Inaccessible home environment      Co-evaluation               End of Session Equipment Utilized During Treatment: Gait belt Activity Tolerance: Patient tolerated treatment well Patient left: in bed;with call bell/phone within reach;with bed alarm set;with family/visitor present Nurse Communication: Mobility status         Time: 1442-1510 PT Time Calculation (min) (ACUTE ONLY): 28 min   Charges:   PT Evaluation $Initial PT Evaluation Tier I: 1 Procedure PT Treatments $Gait Training: 8-22 mins   PT G CodesClaretha Ortega 09/12/2015, 5:42 PM Tyler Ortega PT [phone]

## 2015-09-13 LAB — BASIC METABOLIC PANEL
Anion gap: 5 (ref 5–15)
BUN: 22 mg/dL — ABNORMAL HIGH (ref 6–20)
CO2: 24 mmol/L (ref 22–32)
Calcium: 8.4 mg/dL — ABNORMAL LOW (ref 8.9–10.3)
Chloride: 107 mmol/L (ref 101–111)
Creatinine, Ser: 1.18 mg/dL (ref 0.61–1.24)
GFR calc Af Amer: >60 mL/min (ref >60)
GFR calc non Af Amer: 59 mL/min — ABNORMAL LOW (ref >60)
Glucose, Bld: 160 mg/dL — ABNORMAL HIGH (ref 65–99)
Potassium: 4.1 mmol/L (ref 3.5–5.1)
Sodium: 136 mmol/L (ref 135–145)

## 2015-09-13 LAB — CBC
HCT: 32.1 % — ABNORMAL LOW (ref 39.0–52.0)
Hemoglobin: 10.7 g/dL — ABNORMAL LOW (ref 13.0–17.0)
MCH: 33 pg (ref 26.0–34.0)
MCHC: 33.3 g/dL (ref 30.0–36.0)
MCV: 99.1 fL (ref 78.0–100.0)
Platelets: 142 10*3/uL — ABNORMAL LOW (ref 150–400)
RBC: 3.24 MIL/uL — ABNORMAL LOW (ref 4.22–5.81)
RDW: 14.1 % (ref 11.5–15.5)
WBC: 13.2 10*3/uL — ABNORMAL HIGH (ref 4.0–10.5)

## 2015-09-13 MED ORDER — ASPIRIN 325 MG PO TBEC: 325.0000 mg | DELAYED_RELEASE_TABLET | Freq: Two times a day (BID) | ORAL | Status: DC

## 2015-09-13 MED ORDER — POLYETHYLENE GLYCOL 3350 17 G PO PACK: 17.0000 g | PACK | Freq: Two times a day (BID) | ORAL | Status: DC

## 2015-09-13 MED ORDER — FERROUS SULFATE 325 (65 FE) MG PO TABS: 325.0000 mg | ORAL_TABLET | Freq: Three times a day (TID) | ORAL | Status: DC

## 2015-09-13 MED ORDER — HYDROCODONE-ACETAMINOPHEN 7.5-325 MG PO TABS: 1.0000 | ORAL_TABLET | ORAL | Status: DC | PRN

## 2015-09-13 MED ORDER — METHOCARBAMOL 750 MG PO TABS: 750.0000 mg | ORAL_TABLET | Freq: Three times a day (TID) | ORAL | Status: DC | PRN

## 2015-09-13 MED ORDER — DOCUSATE SODIUM 100 MG PO CAPS: 100.0000 mg | ORAL_CAPSULE | Freq: Two times a day (BID) | ORAL | Status: DC

## 2015-09-13 NOTE — Evaluation (Signed)
Occupational Therapy Evaluation Patient Details Name: Tyler Ortega MRN: [id number] DOB: Oct 31, 1940 Today's Date: 09/13/2015    History of Present Illness [name]   Clinical Impression   Patient presenting with decreased ADL and functional mobility independence secondary to above. Patient independent PTA. Patient currently functioning at an overall min to mod assist level. Patient will benefit from acute OT to increase overall independence in the areas of ADLs, functional mobility, and overall safety in order to safely discharge home with wife.     Follow Up Recommendations  No OT follow up;Supervision/Assistance - 24 hour    Equipment Recommendations  3 in 1 bedside comode;Other (comment) (AE - reacher, sock aid, LH sponge, LH shoe horn)    Recommendations for Other Services  None at this time   Precautions / Restrictions Precautions Precautions: Fall Restrictions Weight Bearing Restrictions: Yes RLE Weight Bearing: Weight bearing as tolerated    Mobility Bed Mobility Overal bed mobility: Needs Assistance Bed Mobility: Supine to Sit     Supine to sit: Min assist     General bed mobility comments: Assistance needed for RLE, increased pain during bed mobility.   Transfers Overall transfer level: Needs assistance Equipment used: Rolling walker (2 wheeled) Transfers: Sit to/from Stand Sit to Stand: Min assist General transfer comment: cues for hand and  r leg position    Balance Overall balance assessment: Needs assistance Sitting-balance support: Feet supported;No upper extremity supported Sitting balance-Leahy Scale: Fair Sitting balance - Comments: due to increased pain, pt needed BUE support majority of time during sitting   Standing balance support: Bilateral upper extremity supported;During functional activity Standing balance-Leahy Scale: Fair    ADL Overall ADL's : Needs assistance/impaired Eating/Feeding: Set up;Sitting Eating/Feeding Details (indicate  cue type and reason): sitting supported Grooming: Set up;Sitting Grooming Details (indicate cue type and reason): sitting supported Upper Body Bathing: Set up;Sitting Upper Body Bathing Details (indicate cue type and reason): sitting supported Lower Body Bathing: Moderate assistance;Sit to/from stand   Upper Body Dressing : Set up;Sitting Upper Body Dressing Details (indicate cue type and reason): sitting supported Lower Body Dressing: Moderate assistance;Sit to/from stand   Toilet Transfer: Minimal assistance;RW;Grab bars;BSC       Tub/ Banker: Minimal assistance;Ambulation;Rolling walker;Shower seat   Functional mobility during ADLs: Minimal assistance;Cueing for safety;Rolling walker General ADL Comments: Wife present and therapist educated wife on safe and effective ADL techniques and transfers. Encouraged use of AE to increase independence with LB ADLs and went over how to use the equipment. Wife states she plans to purchase kit today. Both very interested in [facility] for patietn's safety and independence.     Vision Vision Assessment?: No apparent visual deficits          Pertinent Vitals/Pain Pain Assessment: 0-10 Pain Score: 9  ("as much as a 9" during ambualtion) Pain Location: RLE Pain Descriptors / Indicators: Sharp;Shooting Pain Intervention(s): Limited activity within patient's tolerance;Monitored during session;Repositioned;Ice applied     Hand Dominance Right   Extremity/Trunk Assessment Upper Extremity Assessment Upper Extremity Assessment: Overall WFL for tasks assessed   Lower Extremity Assessment Lower Extremity Assessment: Defer to PT evaluation   Cervical / Trunk Assessment Cervical / Trunk Assessment: Kyphotic   Communication Communication Communication: No difficulties   Cognition Arousal/Alertness: Awake/alert Behavior During Therapy: WFL for tasks assessed/performed Overall Cognitive Status: Within Functional Limits for tasks assessed               Home Living Family/patient expects to be discharged to:: Private residence  Living Arrangements: Spouse/significant other Available Help at Discharge: Family;Available 24 hours/day Type of Home: House Home Access: Stairs to enter [employer] of Steps: 1 x 2   Home Layout: Two level;1/2 bath on main level;Bed/bath upstairs Alternate Level Stairs-Number of Steps: 8 + 8 with landing Alternate Level Stairs-Rails: Left Bathroom Shower/Tub: Walk-in shower;Door   [employer] Toilet: Standard     Home Equipment: Cane - single point   Prior Functioning/Environment Level of Independence: Independent     OT Diagnosis: Generalized weakness;Acute pain   OT Problem List: Decreased strength;Decreased range of motion;Decreased activity tolerance;Impaired balance (sitting and/or standing);Decreased safety awareness;Decreased knowledge of use of DME or AE;Pain   OT Treatment/Interventions: Self-care/ADL training;Therapeutic exercise;Energy conservation;DME and/or AE instruction;Therapeutic activities;Patient/family education;Balance training    OT Goals(Current goals can be found in the care plan section) Acute Rehab OT Goals Patient Stated Goal: get stronger and go home today OT Goal Formulation: With patient/family Time For Goal Achievement: 09/27/15 Potential to Achieve Goals: Good ADL Goals Pt Will Perform Grooming: with modified independence;standing Pt Will Perform Lower Body Bathing: with modified independence;sit to/from stand;with adaptive equipment Pt Will Perform Lower Body Dressing: with modified independence;sit to/from stand;with adaptive equipment Pt Will Transfer to Toilet: with modified independence;ambulating;bedside commode Pt Will Perform Tub/Shower Transfer: Shower transfer;shower seat;ambulating;rolling walker;with modified independence Additional ADL Goal #1: Pt will be mod I with functional mobility using RW  OT Frequency: Min 2X/week   Barriers  to D/C: none known at this time   End of Session Equipment Utilized During Treatment: Gait belt;Rolling walker  Activity Tolerance: Patient tolerated treatment well Patient left: in chair;with call bell/phone within reach;with chair alarm set;with family/visitor present   Time: 6734-1937 OT Time Calculation (min): 29 min Charges:  OT General Charges $OT Visit: 1 Procedure OT Evaluation $Initial OT Evaluation Tier I: 1 Procedure OT Treatments $Self Care/Home Management : 8-22 mins  Kialee Kham , MS, OTR/L, CLT Pager: [phone]  09/13/2015, 10:48 AM

## 2015-09-13 NOTE — Progress Notes (Signed)
     Subjective: 1 Day Post-Op Procedure(s) (LRB): RIGHT TOTAL HIP ARTHROPLASTY ANTERIOR APPROACH (Right)   Patient reports pain as mild, pain controlled. No events throughout the night. Questions encouraged and answered. Ready to be discharged home if he does well with PT.   Objective:   VITALS:   Filed Vitals:   09/13/15 0259 09/13/15 0642  BP: 100/61 100/72  Pulse: 69 64  Temp: 99 F (37.2 C) 98.1 F (36.7 C)  Resp: 16 16    Dorsiflexion/Plantar flexion intact Incision: dressing C/D/I No cellulitis present Compartment soft  LABS  Recent Labs  09/13/15 0504  HGB 10.7*  HCT 32.1*  WBC 13.2*  PLT 142*     Recent Labs  09/13/15 0504  NA 136  K 4.1  BUN 22*  CREATININE 1.18  GLUCOSE 160*     Assessment/Plan: 1 Day Post-Op Procedure(s) (LRB): RIGHT TOTAL HIP ARTHROPLASTY ANTERIOR APPROACH (Right) Foley cath d/c'ed Advance diet Up with therapy D/C IV fluids Discharge home with home health  Follow [facility] 2 [facility]. Follow up with OLIN,Oma Marzan D in 2 weeks.  Contact information:  [facility] [address], [place] [id number]    Overweight (BMI 25-29.9) Estimated body mass index is 26.96 kg/(m^2) as calculated from the following:   Height as of this encounter: 5\' 8"  (1.727 m).   Weight as of this encounter: 80.4 kg (177 lb 4 oz). Patient also counseled that weight may inhibit the healing process Patient counseled that losing weight will help with future health issues        West Pugh. Breckin Zafar   PAC  09/13/2015, 8:58 AM

## 2015-09-13 NOTE — Progress Notes (Signed)
Physical Therapy Treatment Patient Details Name: Tyler Ortega MRN: [id number] DOB: 1941-01-25 Today's Date: 09/13/2015    History of Present Illness [name]    PT Comments    Progressing well. Ready for DC.  Follow Up Recommendations  Home health PT;Supervision/Assistance - 24 hour     Equipment Recommendations  Crutches    Recommendations for Other Services       Precautions / Restrictions Precautions Precautions: Fall    Mobility  Bed Mobility   Bed Mobility: Sit to Supine     Supine to sit: Min guard     General bed mobility comments: able to get the R leg onto bed.  Transfers   Equipment used: Rolling walker (2 wheeled) Transfers: Sit to/from Stand Sit to Stand: Supervision         General transfer comment: cues for hand and  r leg position  Ambulation/Gait Ambulation/Gait assistance: Supervision Ambulation Distance (Feet): 60 Feet Assistive device: Crutches Gait Pattern/deviations: Step-to pattern;Step-through pattern     General Gait Details: does well with crutches.   Stairs  Wheelchair Mobility    Modified [place]in (Stroke Patients Only)       Balance                                    Cognition Arousal/Alertness: Awake/alert                          Exercises    General Comments        Pertinent Vitals/Pain Pain Score: 3  Pain Location: R thigh Pain Descriptors / Indicators: Tightness Pain Intervention(s): Monitored during session;Premedicated before session;Ice applied    Home Living                      Prior Function            PT Goals (current goals can now be found in the care plan section) Progress towards PT goals: Progressing toward goals    Frequency  7X/week    PT Plan Current plan remains appropriate    Co-evaluation             End of Session Equipment Utilized During Treatment: Gait belt Activity Tolerance: Patient tolerated treatment well Patient  left: in chair;with family/visitor present     Time: [id number] PT Time Calculation (min) (ACUTE ONLY): 10 min  Charges:  $Gait Training: 8-22 mins $Therapeutic Exercise: 8-22 mins                    G Codes:      Claretha Cooper 09/13/2015, 4:13 PM

## 2015-09-13 NOTE — Progress Notes (Signed)
Physical Therapy Treatment Patient Details Name: Tyler Ortega MRN: [id number] DOB: Oct 26, 1940 Today's Date: 09/13/2015    History of Present Illness Tyler Ortega    PT Comments    Patient is mobilizing very well. Will practice crutches so patient's wife will not have to lug RW up/down stairs.  Follow Up Recommendations  Home health PT;Supervision/Assistance - 24 hour     Equipment Recommendations  Crutches    Recommendations for Other Services       Precautions / Restrictions Precautions Precautions: Fall    Mobility  Bed Mobility   Bed Mobility: Sit to Supine     Supine to sit: Min guard     General bed mobility comments: able to get the Tyler leg onto bed.  Transfers   Equipment used: Rolling walker (2 wheeled) Transfers: Sit to/from Stand Sit to Stand: Supervision         General transfer comment: cues for hand and  Tyler leg position  Ambulation/Gait Ambulation/Gait assistance: Supervision Ambulation Distance (Feet): 600 Feet Assistive device: Rolling walker (2 wheeled) Gait Pattern/deviations: Step-to pattern;Step-through pattern     General Gait Details: cues for sequence and posture, look ahead   Stairs Stairs: Yes Stairs assistance: Min assist Stair Management: One rail Left;Step to pattern;Forwards;With crutches Number of Stairs: 4 General stair comments: then 1 with RW, wife present  to asist, cues for sequence  Wheelchair Mobility    Modified Rankin (Stroke Patients Only)       Balance                                    Cognition Arousal/Alertness: Awake/alert                          Exercises Total Joint Exercises Ankle Circles/Pumps: AROM;Right;10 reps;Supine Quad Sets: AROM;Right;10 reps Short Arc Quad: AROM;Right;10 reps;Supine Heel Slides: AROM;Right;10 reps;Supine Hip ABduction/ADduction: AROM;Right;10 reps;Supine Long Arc Quad: AROM;Right;10 reps;Seated    General Comments        Pertinent  Vitals/Pain Pain Score: 3  Pain Location:  Tyler hip/thigh Pain Descriptors / Indicators: Discomfort;Tightness Pain Intervention(s): Monitored during session;Premedicated before session;Ice applied    Home Living                      Prior Function            PT Goals (current goals can now be found in the care plan section) Progress towards PT goals: Progressing toward goals    Frequency  7X/week    PT Plan Current plan remains appropriate    Co-evaluation             End of Session Equipment Utilized During Treatment: Gait belt Activity Tolerance: Patient tolerated treatment well Patient left: in bed;with call bell/phone within reach;with family/visitor present     Time: 1210-1234 PT Time Calculation (min) (ACUTE ONLY): 24 min  Charges:  $Gait Training: 8-22 mins $Therapeutic Exercise: 8-22 mins                    G Codes:      Tyler Ortega 09/13/2015, 4:10 PM

## 2015-09-25 NOTE — Discharge Summary (Signed)
Physician Discharge Summary  Patient ID: Tyler Ortega MRN: [id number] DOB/AGE: 73/28/1942 73 y.o.  Admit date: 09/12/2015 Discharge date: 09/13/2015   Procedures:  Procedure(s) (LRB): RIGHT TOTAL HIP ARTHROPLASTY ANTERIOR APPROACH (Right)  Attending Physician:  Dr. Paralee Cancel   Admission Diagnoses:   Right hip primary OA / pain  Discharge Diagnoses:  Principal Problem:   S/P right THA, AA  Past Medical History  Diagnosis Date  . Hypertension   . Hyperlipidemia   . CAD (coronary artery disease)     MI 2011, seen @ [name], sees Public librarian (once a year)  . Skin cancer     R calf and the ear (?side)  . Arthritis     osteoarthritis -hips, back pain tx with Prednisone-last dose 09-04-15.  . Transfusion history     2nd Hip surgery    HPI:    Tyler Ortega, 74 y.o. male, has a history of pain and functional disability in the right hip(s) due to arthritis and patient has failed non-surgical conservative treatments for greater than 12 weeks to include NSAID's and/or analgesics, corticosteriod injections and activity modification. Onset of symptoms was gradual starting years ago with gradually worsening course since that time.The patient noted no past surgery on the right hip(s). Patient currently rates pain in the right hip at 9 out of 10 with activity. Patient has worsening of pain with activity and weight bearing, trendelenberg gait, pain that interfers with activities of daily living and pain with passive range of motion. Patient has evidence of periarticular osteophytes and joint space narrowing by imaging studies. This condition presents safety issues increasing the risk of falls. There is no current active infection. Risks, benefits and expectations were discussed with the patient. Risks including but not limited to the risk of anesthesia, blood clots, nerve damage, blood vessel damage, failure of the prosthesis, infection and up to and including death. Patient  understand the risks, benefits and expectations and wishes to proceed with surgery.   PCP: [name], MD   Discharged Condition: good  Hospital Course:  Patient underwent the above stated procedure on 09/12/2015. Patient tolerated the procedure well and brought to the recovery room in good condition and subsequently to the floor.  POD #1 BP: 110/72 ; Pulse: 64 ; Temp: 98.1 F (36.7 C) ; Resp: 16 Patient reports pain as mild, pain controlled. No events throughout the night. Questions encouraged and answered. Ready to be discharged home. Dorsiflexion/plantar flexion intact, incision: dressing C/D/I, no cellulitis present and compartment soft.   LABS  Basename    HGB     10.7  HCT     32.1    Discharge Exam: General appearance: alert, cooperative and no distress Extremities: Homans sign is negative, no sign of DVT, no edema, redness or tenderness in the calves or thighs and no ulcers, gangrene or trophic changes  Disposition: Home with follow up in 2 weeks   Follow-up Information    Follow up with Mauri Pole, MD. Schedule an appointment as soon as possible for a visit in 2 weeks.   Specialty:  Orthopedic Surgery   Contact information:   [address] [place] [address] [phone]       Follow up with [facility].   Contact information:   [address] [place] [address] [phone]       Follow up with [facility].   Why:  RW, 3-n-1   Contact information:  [address] [place] [address] [phone]       Discharge Instructions    Call MD / Call 911    Complete by:  As directed   If you experience chest pain or shortness of breath, CALL 911 and be transported to the hospital emergency room.  If you develope a fever above 101 F, pus (white drainage) or increased drainage or redness at the wound, or calf pain, call your surgeon's office.     Change dressing    Complete by:  As directed    Maintain surgical dressing until follow up in the clinic. If the edges start to pull up, may reinforce with tape. If the dressing is no longer working, may remove and cover with gauze and tape, but must keep the area dry and clean.  Call with any questions or concerns.     Constipation Prevention    Complete by:  As directed   Drink plenty of fluids.  Prune juice may be helpful.  You may use a stool softener, such as Colace (over the counter) 100 mg twice a day.  Use MiraLax (over the counter) for constipation as needed.     Diet - low sodium heart healthy    Complete by:  As directed      Discharge instructions    Complete by:  As directed   Maintain surgical dressing until follow up in the clinic. If the edges start to pull up, may reinforce with tape. If the dressing is no longer working, may remove and cover with gauze and tape, but must keep the area dry and clean.  Follow up in 2 weeks at [facility]. Call with any questions or concerns.     Increase activity slowly as tolerated    Complete by:  As directed   Weight bearing as tolerated with assist device (walker, cane, etc) as directed, use it as long as suggested by your surgeon or therapist, typically at least 4-6 weeks.     TED hose    Complete by:  As directed   Use stockings (TED hose) for 2 weeks on both leg(s).  You may remove them at night for sleeping.             Medication List    STOP taking these medications        HYDROcodone-acetaminophen 5-325 MG tablet  Commonly known as:  NORCO/VICODIN  Replaced by:  HYDROcodone-acetaminophen 7.5-325 MG tablet      TAKE these medications        aspirin 325 MG EC tablet  Take 1 tablet (325 mg total) by mouth 2 (two) times daily.     atorvastatin 40 MG tablet  Commonly known as:  LIPITOR  TAKE 1 TABLET EVERY DAY     docusate sodium 100 MG capsule  Commonly known as:  COLACE  Take 1 capsule (100 mg total) by mouth 2 (two) times daily.     ferrous sulfate  325 (65 FE) MG tablet  Take 1 tablet (325 mg total) by mouth 3 (three) times daily after meals.     HYDROcodone-acetaminophen 7.5-325 MG tablet  Commonly known as:  NORCO  Take 1-2 tablets by mouth every 4 (four) hours as needed for moderate pain.     methocarbamol 750 MG tablet  Commonly known as:  ROBAXIN-750  Take 1 tablet (750 mg total) by mouth every 8 (eight) hours as needed for muscle spasms.     polyethylene glycol packet  Commonly  known as:  MIRALAX / GLYCOLAX  Take 17 g by mouth 2 (two) times daily.     predniSONE 10 MG tablet  Commonly known as:  DELTASONE  4 tablets x 2 days, 3 tabs x 2 days, 2 tabs x 2 days, 1 tab x 2 days         Signed: West Pugh. Gilman Olazabal   PA-C  09/25/2015, 12:31 PM

## 2015-09-27 ENCOUNTER — Encounter: Payer: Self-pay | Admitting: Medical

## 2015-09-27 ENCOUNTER — Ambulatory Visit: Payer: Medicare Other | Admitting: Medical

## 2015-09-27 ENCOUNTER — Ambulatory Visit (INDEPENDENT_AMBULATORY_CARE_PROVIDER_SITE_OTHER): Payer: Medicare Other | Admitting: Medical

## 2015-09-27 VITALS — BP 120/76 | HR 77 | Temp 97.8°F | Ht 68.0 in | Wt 173.2 lb

## 2015-09-27 DIAGNOSIS — R21 Rash and other nonspecific skin eruption: Secondary | ICD-10-CM

## 2015-09-27 DIAGNOSIS — T7840XA Allergy, unspecified, initial encounter: Secondary | ICD-10-CM

## 2015-09-27 MED ORDER — HYDROXYZINE HCL 25 MG PO TABS: 25.0000 mg | ORAL_TABLET | Freq: Three times a day (TID) | ORAL | Status: DC | PRN

## 2015-09-27 MED ORDER — METHYLPREDNISOLONE ACETATE 40 MG/ML IJ SUSP
40.0000 mg | Freq: Once | INTRAMUSCULAR | Status: AC
  Administered 2015-09-27: 40 mg via INTRAMUSCULAR

## 2015-09-27 MED ORDER — AMMONIUM LACTATE 5 % EX LOTN: 1.0000 "application " | TOPICAL_LOTION | Freq: Two times a day (BID) | CUTANEOUS | Status: DC

## 2015-09-27 MED ORDER — PREDNISONE 20 MG PO TABS: ORAL_TABLET | ORAL | Status: DC

## 2015-09-27 NOTE — Progress Notes (Signed)
Pre visit review using our clinic review tool, if applicable. No additional management support is needed unless otherwise documented below in the visit note. 

## 2015-09-27 NOTE — Patient Instructions (Addendum)
  The exact etiology of your  allergic reaction is  unknown . We gave you depo-medrol 40 im injection. I am also prescribing oral prednisone and hydroxyzine for itching. Your rash should gradually improve. If worsening or expanding  please notify us. If your rash reoccurs intermittently and no cause is identified then could consider allergist referral.  Follow up in 7 days or as needed.   Please update [place] on Friday am on how you are doing.  In event you have some rash from dry skin rx of lac-hydrin.

## 2015-09-27 NOTE — Progress Notes (Signed)
Subjective:    Patient ID: Tyler Ortega, male    DOB: May 06, 1941, 74 y.o.   MRN: [id number]  HPI  Pt has diffuse rash on legs , thorax, and his arms.  Pt has severe itching. Agitated due to rash.   Pt had surgery 2 wks ago. Pt has not been on any antibiotic over last 2 wks.  Pt had cefazolin at time of surgery. I don't see that he had any other antibiotic.No hx of allergy to cephalosporins.  Pt has recent rash that itches. Reports rash occurred after no known particular exposure. On review pt does not report any suspicious exposure to soaps, creams, detergents, make up, lotions, detergents, animal exposure exposure, plants or insect bites.  Pt rash is in the area of. Pt reports no shortness of breath or wheezing. . Pt is not diabetic.  Pt tried benadryl last night. Helped only ltitle bit.  Pt states leg rash area has stopped. But now over mostly posterior thorax, rt flank and upper back both sides.     Review of Systems  Constitutional: Negative for fever, chills and fatigue.  Respiratory: Negative for cough, chest tightness, shortness of breath and wheezing.   Cardiovascular: Negative for chest pain and palpitations.  Gastrointestinal: Negative for abdominal pain.  Skin: Positive for rash.       See hpi.  Neurological: Negative for dizziness, weakness and headaches.  Hematological: Negative for adenopathy. Does not bruise/bleed easily.  Psychiatric/Behavioral: Negative for behavioral problems and confusion.   Past Medical History  Diagnosis Date  . Hypertension   . Hyperlipidemia   . CAD (coronary artery disease)     MI 2011, seen @ [name], sees Public librarian (once a year)  . Skin cancer     R calf and the ear (?side)  . Arthritis     osteoarthritis -hips, back pain tx with Prednisone-last dose 09-04-15.  . Transfusion history     2nd Hip surgery    Social History   Social History  . Marital Status: Married    Spouse Name: N/A  . Number of Children: 3  .  Years of Education: N/A   Occupational History  . Armed forces operational officer, semi retired    .     Social History Main Topics  . Smoking status: Never Smoker   . Smokeless tobacco: Never Used  . Alcohol Use: Yes     Comment: rarely   . Drug Use: No  . Sexual Activity: Not on file   Other Topics Concern  . Not on file   Social History Narrative   1 Daughter lives in [place], lives w/ wife        Past Surgical History  Procedure Laterality Date  . Inguinal hernia repair Left   . Hip surgery      x3 - L hip (initial hip replacement bleed)  . Colonoscopy  05/29/11    Normal  . Coronary artery bypass graft      05-2010-x4 vessel [facility]  . Vasectomy    . Total hip arthroplasty Right 09/12/2015    Procedure: RIGHT TOTAL HIP ARTHROPLASTY ANTERIOR APPROACH;  Surgeon: Paralee Cancel, MD;  Location: [facility];  Service: Orthopedics;  Laterality: Right;    Family History  Problem Relation Age of Onset  . Coronary artery disease Neg Hx   . Diabetes Neg Hx   . Colon cancer Neg Hx   . Prostate cancer Neg Hx     No Known Allergies  Current Outpatient Prescriptions  on File Prior to Visit  Medication Sig Dispense Refill  . aspirin EC 325 MG EC tablet Take 1 tablet (325 mg total) by mouth 2 (two) times daily. 60 tablet 0  . atorvastatin (LIPITOR) 40 MG tablet TAKE 1 TABLET EVERY DAY 30 tablet 0  . docusate sodium (COLACE) 100 MG capsule Take 1 capsule (100 mg total) by mouth 2 (two) times daily. 10 capsule 0  . ferrous sulfate 325 (65 FE) MG tablet Take 1 tablet (325 mg total) by mouth 3 (three) times daily after meals.  3  . polyethylene glycol (MIRALAX / GLYCOLAX) packet Take 17 g by mouth 2 (two) times daily. 14 each 0   No current facility-administered medications on file prior to visit.    BP 120/76 mmHg  Pulse 77  Temp(Src) 97.8 F (36.6 C) (Oral)  Ht 5\' 8"  (1.727 m)  Wt 173 lb 3.2 oz (78.563 kg)  BMI 26.34 kg/m2  SpO2 98%       Objective:   Physical  Exam  General Mental Status- Alert. General Appearance- Not in acute distress.   Skin General: Color- Normal Color. Moisture- Normal Moisture.  Mouth- buccal mucosa normal. No swelling of throat.  Neck Carotid Arteries- Normal color. Moisture- Normal Moisture. No carotid bruits. No JVD.  Chest and Lung Exam Auscultation: Breath Sounds:-Normal.  Cardiovascular Auscultation:Rythm- Regular. Murmurs & Other Heart Sounds:Auscultation of the heart reveals- No Murmurs.  Abdomen Inspection:-Inspeection Normal. Palpation/Percussion:Note:No mass. Palpation and Percussion of the abdomen reveal- Non Tender, Non Distended + BS, no rebound or guarding.    Neurologic Cranial Nerve exam:- CN III-XII intact(No nystagmus), symmetric smile. Strength:- 5/5 equal and symmetric strength both upper and lower extremities.  Derm- scattered mild rash right flank and upper posterior thorax. Lower back on both side moderate prominent bright red rash. No warmth and no tenderness. No vesicles see. Rash lower back on both sides.       Assessment & Plan:  The exact etiology of your  allergic reaction is  unknown . We gave you depo-medrol 40 im injection. I am also prescribing oral prednisone and hydroxyzine for itching. Your rash should gradually improve. If worsening or expanding  please notify us. If your rash reoccurs intermittently and no cause is identified then could consider allergist referral.  Follow up in 7 days or as needed.   Please update [place] on Friday am on how you are doing.  In event you have some rash from dry skin rx of lac-hydrin.

## 2015-09-29 ENCOUNTER — Telehealth: Payer: Self-pay | Admitting: Internal Medicine

## 2015-09-29 NOTE — Telephone Encounter (Signed)
Relation to [id number] Call back [phone]   Reason for call:   Patient wanted to inform [name] symptoms have improve regarding rash

## 2015-09-29 NOTE — Telephone Encounter (Signed)
Forwarded to [name].

## 2016-02-05 ENCOUNTER — Encounter: Payer: Self-pay | Admitting: Internal Medicine

## 2016-02-05 ENCOUNTER — Ambulatory Visit (INDEPENDENT_AMBULATORY_CARE_PROVIDER_SITE_OTHER): Payer: Medicare Other | Admitting: Internal Medicine

## 2016-02-05 ENCOUNTER — Telehealth: Payer: Self-pay | Admitting: Internal Medicine

## 2016-02-05 VITALS — BP 118/66 | HR 72 | Temp 97.7°F | Resp 16 | Ht 68.0 in | Wt 174.0 lb

## 2016-02-05 DIAGNOSIS — Z09 Encounter for follow-up examination after completed treatment for conditions other than malignant neoplasm: Secondary | ICD-10-CM | POA: Diagnosis not present

## 2016-02-05 DIAGNOSIS — G459 Transient cerebral ischemic attack, unspecified: Secondary | ICD-10-CM | POA: Diagnosis not present

## 2016-02-05 DIAGNOSIS — R413 Other amnesia: Secondary | ICD-10-CM | POA: Diagnosis not present

## 2016-02-05 NOTE — Telephone Encounter (Signed)
[facility] - Client [facility]     Patient Name: Tyler Ortega Initial Comment Caller States husband is suddenly confused this morning, can not remember what he has done this morning, BP 109/66  DOB: [birth year]      Nurse Assessment  Nurse: Luther Parody, RN, Malachy Mood Date/Time (Eastern Time): 02/05/2016 11:15:23 AM  Confirm and document reason for call. If symptomatic, describe symptoms. You must click the next button to save text entered. ---Caller states that her spouse had had sudden onset of confusion this am. States that he got up and drove himself to the gym and back but has no memory of what he did there or anything else that he did this am. Pt is awake and alert, oriented to person and place.  Has the patient traveled out of the country within the last 30 days? ---Not Applicable  Does the patient have any new or worsening symptoms? ---Yes  Will a triage be completed? ---Yes  Related visit to physician within the last 2 weeks? ---No  Does the PT have any chronic conditions? (i.e. diabetes, asthma, etc.) ---Yes  List chronic conditions. ---cardiac  Is this a behavioral health or substance abuse call? ---No    Guidelines     Guideline Title Affirmed Question Affirmed Notes   Confusion - Delirium [1] Difficult to awaken or acting confused (disoriented, slurred speech) AND [2] present now AND [3] new onset    Final Disposition User   Call EMS 911 Now Luther Parody, RN, Malachy Mood       Disagree/Comply: Comply

## 2016-02-05 NOTE — Progress Notes (Signed)
Subjective:    Patient ID: Tyler Ortega, male    DOB: Jul 02, 1941, 75 y.o.   MRN: [id number]  DOS:  02/05/2016 Type of visit - description :  Interval history: Pt woke up this morning, went to Bible study and subsequently to the gym. His wife was still sleeping so she didin't see him then. A couple of hours later he returned home and told his wife he was upset because he couldn't remember what happened in the previous 2 hours. The wife saw him and didn't see any particular problem such as slurred speech or motor deficits. They called the office, subsequently EMS arrived, reportedly BP and blood sugar were okay and he was recommended to see his doctor. As  hours went by, the memory started to come back, he realized that he was at the Bible study , the gym; at the gym he felt slightly weird (foggy?). At this point he is back to his normal self. On looking back he had no previous episodes like these in the last year    Review of Systems Denies fever, chills, weight loss. No chest pain or difficulty breathing No polyuria or polydipsia No nausea vomiting No recent head injury No rash anywhere No dizziness, diplopia, slurred speech, motor deficits. No seizure type of activity or bladder or bowel incontinence ever.   Past Medical History  Diagnosis Date  . Hypertension   . Hyperlipidemia   . CAD (coronary artery disease)     MI 2011, seen @ [name], sees Public librarian (once a year)  . Skin cancer     R calf and the ear (?side)  . Arthritis     osteoarthritis -hips, back pain tx with Prednisone-last dose 09-04-15.  . Transfusion history     2nd Hip surgery    Past Surgical History  Procedure Laterality Date  . Inguinal hernia repair Left   . Hip surgery      x3 - L hip (initial hip replacement bleed)  . Colonoscopy  05/29/11    Normal  . Coronary artery bypass graft      05-2010-x4 vessel [facility]  . Vasectomy    . Total hip arthroplasty Right 09/12/2015   Procedure: RIGHT TOTAL HIP ARTHROPLASTY ANTERIOR APPROACH;  Surgeon: Paralee Cancel, MD;  Location: [facility];  Service: Orthopedics;  Laterality: Right;    Social History   Social History  . Marital Status: Married    Spouse Name: N/A  . Number of Children: 3  . Years of Education: N/A   Occupational History  . Armed forces operational officer, semi retired    .     Social History Main Topics  . Smoking status: Never Smoker   . Smokeless tobacco: Never Used  . Alcohol Use: Yes     Comment: rarely   . Drug Use: No  . Sexual Activity: Not on file   Other Topics Concern  . Not on file   Social History Narrative   1 Daughter lives in [place], lives w/ wife            Medication List       This list is accurate as of: 02/05/16  8:19 PM.  Always use your most recent med list.               aspirin 325 MG EC tablet  Take 1 tablet (325 mg total) by mouth 2 (two) times daily.     atorvastatin 40 MG tablet  Commonly known as:  LIPITOR  TAKE 1 TABLET EVERY DAY           Objective:   Physical Exam BP 118/66 mmHg  Pulse 72  Temp(Src) 97.7 F (36.5 C) (Oral)  Resp 16  Ht 5\' 8"  (1.727 m)  Wt 174 lb (78.926 kg)  BMI 26.46 kg/m2  SpO2 97%  General:   Well developed, well nourished . NAD.  Neck:   normal carotid pulse HEENT:  Normocephalic . Face symmetric, atraumatic Lungs:  CTA B Normal respiratory effort, no intercostal retractions, no accessory muscle use. Heart: RRR,  no murmur.  No pretibial edema bilaterally  Abdomen:  Not distended, soft, non-tender. No rebound or rigidity.   Skin: Exposed areas without rash. Not pale. Not jaundice Neurologic:  alert & oriented X3.  Speech normal, gait appropriate for age and unassisted Strength symmetric and appropriate for age. EOMI, pupils equal and reactive.  Psych: Cognition and judgment appear intact.  Cooperative with normal attention span and concentration.  Behavior appropriate. No anxious or depressed appearing.      Assessment & Plan:   Assessment > HTN Hyperlipidemia CAD: MI and CABG 2011, cardiology Dr Prince Rome   Plan:  Transient amnesia :  DDX is large but includes TIA, seizure disorder, metabolic issues, statin related, others. Patient is back to normal, neurological exam normal. EKG Plan: CMP, CBC, 123XX123, folic acid, MRI brain, carotid ultrasound. If w/u neg, we agreed to strongly consider neurology referral. RTC 2 months

## 2016-02-05 NOTE — Telephone Encounter (Signed)
Pt has an appt scheduled with [name] today (02/05/16) at 2:30 pm.

## 2016-02-05 NOTE — Progress Notes (Signed)
Pre visit review using our clinic review tool, if applicable. No additional management support is needed unless otherwise documented below in the visit note. 

## 2016-02-05 NOTE — Telephone Encounter (Signed)
Caller name: [name] Relationship to patient: wife Can be reached: [phone]   Reason for call: pt wife calling stating pt very confused. He went to bible study and the gym this morning but cannot remember having gone. He is disoriented. Transferred to [facility] with [facility].

## 2016-02-05 NOTE — Assessment & Plan Note (Signed)
Transient amnesia :  DDX is large but includes TIA, seizure disorder, metabolic issues, statin related, others. Patient is back to normal, neurological exam normal. EKG Plan: CMP, CBC, 123XX123, folic acid, MRI brain, carotid ultrasound. If w/u neg, we agreed to strongly consider neurology referral. RTC 2 months

## 2016-02-05 NOTE — Patient Instructions (Signed)
GO TO THE LAB :      Get the blood work       GO TO THE FRONT DESK Schedule your next appointment for a  checkup in 2 months  Call anytime if he has any unusual symptoms or the memory issue come back.

## 2016-02-06 ENCOUNTER — Other Ambulatory Visit: Payer: Self-pay | Admitting: Internal Medicine

## 2016-02-06 DIAGNOSIS — R413 Other amnesia: Secondary | ICD-10-CM

## 2016-02-06 LAB — VITAMIN B12: Vitamin B-12: 118 pg/mL — ABNORMAL LOW (ref 211–911)

## 2016-02-06 LAB — CBC WITH DIFFERENTIAL/PLATELET
Basophils Absolute: 0 10*3/uL (ref 0.0–0.1)
Basophils Relative: 0.3 % (ref 0.0–3.0)
Eosinophils Absolute: 0 10*3/uL (ref 0.0–0.7)
Eosinophils Relative: 0.5 % (ref 0.0–5.0)
HCT: 43.6 % (ref 39.0–52.0)
Hemoglobin: 14.5 g/dL (ref 13.0–17.0)
Lymphocytes Relative: 16.3 % (ref 12.0–46.0)
Lymphs Abs: 1.5 10*3/uL (ref 0.7–4.0)
MCHC: 33.3 g/dL (ref 30.0–36.0)
MCV: 95.3 fl (ref 78.0–100.0)
Monocytes Absolute: 0.4 10*3/uL (ref 0.1–1.0)
Monocytes Relative: 4.2 % (ref 3.0–12.0)
Neutro Abs: 7.1 10*3/uL (ref 1.4–7.7)
Neutrophils Relative %: 78.7 % — ABNORMAL HIGH (ref 43.0–77.0)
Platelets: 176 10*3/uL (ref 150.0–400.0)
RBC: 4.57 Mil/uL (ref 4.22–5.81)
RDW: 14.4 % (ref 11.5–15.5)
WBC: 9 10*3/uL (ref 4.0–10.5)

## 2016-02-06 LAB — COMPREHENSIVE METABOLIC PANEL
ALT: 11 U/L (ref 0–53)
AST: 17 U/L (ref 0–37)
Albumin: 4.1 g/dL (ref 3.5–5.2)
Alkaline Phosphatase: 69 U/L (ref 39–117)
BUN: 23 mg/dL (ref 6–23)
CO2: 29 mEq/L (ref 19–32)
Calcium: 9.6 mg/dL (ref 8.4–10.5)
Chloride: 110 mEq/L (ref 96–112)
Creatinine, Ser: 1.2 mg/dL (ref 0.40–1.50)
GFR: 62.91 mL/min (ref >60.00)
Glucose, Bld: 67 mg/dL — ABNORMAL LOW (ref 70–99)
Potassium: 4.5 mEq/L (ref 3.5–5.1)
Sodium: 147 mEq/L — ABNORMAL HIGH (ref 135–145)
Total Bilirubin: 0.5 mg/dL (ref 0.2–1.2)
Total Protein: 6.7 g/dL (ref 6.0–8.3)

## 2016-02-06 LAB — FOLATE: Folate: 13.8 ng/mL (ref >5.9)

## 2016-02-06 LAB — HEMOGLOBIN A1C: Hgb A1c MFr Bld: 6.4 % (ref 4.6–6.5)

## 2016-02-06 LAB — TSH: TSH: 1.18 u[IU]/mL (ref 0.35–4.50)

## 2016-02-09 ENCOUNTER — Ambulatory Visit (INDEPENDENT_AMBULATORY_CARE_PROVIDER_SITE_OTHER): Payer: Medicare Other

## 2016-02-09 DIAGNOSIS — E538 Deficiency of other specified B group vitamins: Secondary | ICD-10-CM

## 2016-02-09 MED ORDER — CYANOCOBALAMIN 1000 MCG/ML IJ SOLN: 1000.0000 ug | Freq: Once | INTRAMUSCULAR | Status: DC

## 2016-02-09 MED ORDER — CYANOCOBALAMIN 1000 MCG/ML IJ SOLN
1000.0000 ug | Freq: Once | INTRAMUSCULAR | Status: AC
  Administered 2016-02-09: 1000 ug via INTRAMUSCULAR

## 2016-02-09 NOTE — Progress Notes (Signed)
Pre visit review using our clinic tool,if applicable. No additional management support is needed unless otherwise documented below in the visit note.   Patient in for B12 injection per orders from Dr. [name].. Patient is B 12 deficient. Given 1000 mcg IM Right deltoid. Patient tolerated well. Juliann Pares, CMA

## 2016-02-10 ENCOUNTER — Ambulatory Visit (HOSPITAL_BASED_OUTPATIENT_CLINIC_OR_DEPARTMENT_OTHER)
Admission: RE | Admit: 2016-02-10 | Discharge: 2016-02-10 | Disposition: A | Discharge status: Home / Self Care | Payer: Medicare Other | Source: Ambulatory Visit | Attending: Internal Medicine | Admitting: Internal Medicine

## 2016-02-10 DIAGNOSIS — I6521 Occlusion and stenosis of right carotid artery: Secondary | ICD-10-CM | POA: Diagnosis not present

## 2016-02-10 DIAGNOSIS — G319 Degenerative disease of nervous system, unspecified: Secondary | ICD-10-CM | POA: Insufficient documentation

## 2016-02-10 DIAGNOSIS — G454 Transient global amnesia: Secondary | ICD-10-CM | POA: Diagnosis not present

## 2016-02-10 DIAGNOSIS — R413 Other amnesia: Secondary | ICD-10-CM

## 2016-02-10 DIAGNOSIS — I6523 Occlusion and stenosis of bilateral carotid arteries: Secondary | ICD-10-CM | POA: Insufficient documentation

## 2016-02-10 DIAGNOSIS — I499 Cardiac arrhythmia, unspecified: Secondary | ICD-10-CM | POA: Diagnosis not present

## 2016-02-12 ENCOUNTER — Other Ambulatory Visit: Payer: Self-pay

## 2016-02-12 DIAGNOSIS — R413 Other amnesia: Secondary | ICD-10-CM

## 2016-02-16 ENCOUNTER — Ambulatory Visit (INDEPENDENT_AMBULATORY_CARE_PROVIDER_SITE_OTHER): Payer: Medicare Other

## 2016-02-16 DIAGNOSIS — E538 Deficiency of other specified B group vitamins: Secondary | ICD-10-CM

## 2016-02-16 MED ORDER — CYANOCOBALAMIN 1000 MCG/ML IJ SOLN
1000.0000 ug | Freq: Once | INTRAMUSCULAR | Status: AC
  Administered 2016-02-16: 1000 ug via INTRAMUSCULAR

## 2016-02-16 NOTE — Progress Notes (Signed)
Pre visit review using our clinic tool,if applicable. No additional management support is needed unless otherwise documented below in the visit note.   Patient in for B12 injection per order from [name].. Patient has B12 defcency. Injection given IM Right deltoid. Patient tolerated well. Appointment scheduled for next injection.

## 2016-02-23 ENCOUNTER — Ambulatory Visit (INDEPENDENT_AMBULATORY_CARE_PROVIDER_SITE_OTHER): Payer: Medicare Other | Admitting: *Deleted

## 2016-02-23 DIAGNOSIS — E538 Deficiency of other specified B group vitamins: Secondary | ICD-10-CM

## 2016-02-23 MED ORDER — CYANOCOBALAMIN 1000 MCG/ML IJ SOLN
1000.0000 ug | Freq: Once | INTRAMUSCULAR | Status: AC
  Administered 2016-02-23: 1000 ug via INTRAMUSCULAR

## 2016-02-23 NOTE — Progress Notes (Signed)
Pre visit review using our clinic review tool, if applicable. No additional management support is needed unless otherwise documented below in the visit note.  Pt tolerated injection well.   Next appt: 03/01/16  Delmos Velaquez J Isaac Dubie, RN   

## 2016-03-01 ENCOUNTER — Ambulatory Visit (INDEPENDENT_AMBULATORY_CARE_PROVIDER_SITE_OTHER): Payer: Medicare Other | Admitting: *Deleted

## 2016-03-01 DIAGNOSIS — E538 Deficiency of other specified B group vitamins: Secondary | ICD-10-CM | POA: Diagnosis not present

## 2016-03-01 MED ORDER — CYANOCOBALAMIN 1000 MCG/ML IJ SOLN
1000.0000 ug | Freq: Once | INTRAMUSCULAR | Status: AC
  Administered 2016-03-01: 1000 ug via INTRAMUSCULAR

## 2016-03-01 NOTE — Progress Notes (Signed)
Pre visit review using our clinic review tool, if applicable. No additional management support is needed unless otherwise documented below in the visit note.  Pt tolerated injection well.   Next appt: 04/15/16 w/ Dr. Archie Balboa, RN

## 2016-03-05 ENCOUNTER — Encounter: Payer: Self-pay | Admitting: Neurology

## 2016-03-05 ENCOUNTER — Ambulatory Visit (INDEPENDENT_AMBULATORY_CARE_PROVIDER_SITE_OTHER): Payer: Medicare Other | Admitting: Neurology

## 2016-03-05 VITALS — BP 127/75 | HR 64 | Ht 68.0 in | Wt 173.2 lb

## 2016-03-05 DIAGNOSIS — E559 Vitamin D deficiency, unspecified: Secondary | ICD-10-CM

## 2016-03-05 DIAGNOSIS — F05 Delirium due to known physiological condition: Secondary | ICD-10-CM

## 2016-03-05 DIAGNOSIS — E519 Thiamine deficiency, unspecified: Secondary | ICD-10-CM

## 2016-03-05 DIAGNOSIS — G08 Intracranial and intraspinal phlebitis and thrombophlebitis: Secondary | ICD-10-CM | POA: Diagnosis not present

## 2016-03-05 DIAGNOSIS — R413 Other amnesia: Secondary | ICD-10-CM

## 2016-03-05 DIAGNOSIS — E538 Deficiency of other specified B group vitamins: Secondary | ICD-10-CM | POA: Diagnosis not present

## 2016-03-05 NOTE — Patient Instructions (Addendum)
Remember to drink plenty of fluid, eat healthy meals and do not skip any meals. Try to eat protein with a every meal and eat a healthy snack such as fruit or nuts in between meals. Try to keep a regular sleep-wake schedule and try to exercise daily, particularly in the form of walking, 20-30 minutes a day, if you can.   As far as diagnostic testing: EEG to evaluate seizures, imaging of the sigmoid sinus to evaluate for venous thrombosis, Possible Transient Global Amnesia, acute b12 deficiency confusional episode  I would like to see you back in 3 months, sooner if we need to. Please call us with any interim questions, concerns, problems, updates or refill requests.   Our phone number is [phone]. We also have an after hours call service for urgent matters and there is a physician on-call for urgent questions. For any emergencies you know to call 911 or go to the nearest emergency room

## 2016-03-05 NOTE — Progress Notes (Signed)
[facility]    Provider:  [name] Referring Provider: Colon Branch, MD Primary Care Physician:  [name], MD  CC:  Temporary memory loss  HPI:  Tyler Ortega is a 75 y.o. male here as a referral from [name] for transient memory loss for 2 hours. PMHx of CAD, HTN, HLD. He was exercising at the time. At the gym. He got home and couldn't remember being at the gym or driving home from the gym. Here with his wife who also provides information. Denies head trauma or inciting events, just exercising. Wife saw him and he was fine when he left. He went to the ED and he was fine there as well and a workup was complted.  He eventually did remember the time he spent in the gym. When he got home he was very confused, he walked back and forth in the home, what he kept saying was that he didn't feel right and he couldn't remember. Even afterwards for days he was not the same per wife. When he got home from the gym he was confused but knew his wife, knew where he was, didn't remember coming home from the gym or driving home. The memories all eventually came back. Everything totally came back. He never had an incident like this,  No alcohol problems. They are eating a balanced diet. Denies headaches, no weakness, no speech difficulties, no other focal neurologic deficits during the incident. Workup in the ED showed severe B12 deficiency at 138 and he is getting shots.   Reviewed notes, labs and imaging from outside physicians, which showed:  Personally reviewed imaging and agree with the following:  IMPRESSION: No acute infarct or intracranial hemorrhage.  Mild to moderate chronic microvascular changes.  Mild global atrophy without hydrocephalus.  Increased signal within the left sigmoid sinus may be related to slow flow although thrombosis cannot be entirely excluded. MR venography or CT venography can be obtained for further delineation if clinically desired.  US carotid  02/2016:  IMPRESSION: Prominent calcified shadowing plaque RIGHT carotid bulb.  By velocity measurements, this corresponds to a less than 50% diameter stenosis.  Minimal plaque LEFT carotid bulb without evidence of hemodynamically significant stenosis.  Patient arrhythmia during exam.  Labs 02/05/2016: Vitamin B12 118, HgbA1c 6.4, TSh 1.18, CBC and CMp unremarkable  Review of Systems: Patient complains of symptoms per HPI as well as the following symptoms: memory loss. Pertinent negatives per HPI. All others negative.   Social History   Social History  . Marital Status: Married    Spouse Name: Tyler Ortega  . Number of Children: 3  . Years of Education: Some colle   Occupational History  . Armed forces operational officer, Retired Other   Social History Main Topics  . Smoking status: Never Smoker   . Smokeless tobacco: Never Used  . Alcohol Use: Yes     Comment: rarely   . Drug Use: No  . Sexual Activity: Not on file   Other Topics Concern  . Not on file   Social History Narrative   1 Daughter lives in [place], lives w/ wife    Caffeine use: 1-2 cups coffee per day   Soda: rare       Family History  Problem Relation Age of Onset  . Coronary artery disease Neg Hx   . Diabetes Neg Hx   . Colon cancer Neg Hx   . Prostate cancer Neg Hx   . Seizures Neg Hx   . Multiple sclerosis  Neg Hx   . Migraines Neg Hx   . Dementia Neg Hx     Past Medical History  Diagnosis Date  . Hypertension   . Hyperlipidemia   . CAD (coronary artery disease)     MI 2011, seen @ [name], sees Public librarian (once a year)  . Skin cancer     R calf and the ear (?side)  . Arthritis     osteoarthritis -hips, back pain tx with Prednisone-last dose 09-04-15.  . Transfusion history     2nd Hip surgery    Past Surgical History  Procedure Laterality Date  . Inguinal hernia repair Left   . Hip surgery      x3 - L hip (initial hip replacement bleed)  . Colonoscopy  05/29/11    Normal  . Coronary artery  bypass graft      05-2010-x4 vessel [facility]  . Vasectomy    . Total hip arthroplasty Right 09/12/2015    Procedure: RIGHT TOTAL HIP ARTHROPLASTY ANTERIOR APPROACH;  Surgeon: Paralee Cancel, MD;  Location: [facility];  Service: Orthopedics;  Laterality: Right;    Current Outpatient Prescriptions  Medication Sig Dispense Refill  . aspirin EC 325 MG EC tablet Take 1 tablet (325 mg total) by mouth 2 (two) times daily. 60 tablet 0  . atorvastatin (LIPITOR) 40 MG tablet TAKE 1 TABLET EVERY DAY 30 tablet 0  . amoxicillin (AMOXIL) 500 MG capsule Per pt: only takes prn for dental appt  0   No current facility-administered medications for this visit.    Allergies as of 03/05/2016  . (No Known Allergies)    Vitals: BP 127/75 mmHg  Pulse 64  Ht 5\' 8"  (1.727 m)  Wt 173 lb 3.2 oz (78.563 kg)  BMI 26.34 kg/m2 Last Weight:  Wt Readings from Last 1 Encounters:  03/05/16 173 lb 3.2 oz (78.563 kg)   Last Height:   Ht Readings from Last 1 Encounters:  03/05/16 5\' 8"  (1.727 m)   Physical exam: Exam: Gen: NAD, conversant, well nourised,  well groomed                     CV: RRR, no MRG. No Carotid Bruits. No peripheral edema, warm, nontender Eyes: Conjunctivae clear without exudates or hemorrhage  Neuro: Detailed Neurologic Exam  Speech:    Speech is normal; fluent and spontaneous with normal comprehension.  Cognition:  MMSE - Mini Mental State Exam 03/05/2016  Orientation to time 4  Orientation to Place 5  Registration 3  Attention/ Calculation 5  Recall 3  Language- name 2 objects 2  Language- repeat 1  Language- follow 3 step command 3  Language- read & follow direction 1  Write a sentence 1  Copy design 1  Total score 29      The patient is oriented to person, place, and time;     recent and remote memory intact;     language fluent;     normal attention, concentration,     fund of knowledge Cranial Nerves:    The pupils are equal, round, and reactive to light. The  fundi are normal and spontaneous venous pulsations are present. Visual fields are full to finger confrontation. Extraocular movements are intact. Trigeminal sensation is intact and the muscles of mastication are normal. The face is symmetric. The palate elevates in the midline. Hearing intact. Voice is normal. Shoulder shrug is normal. The tongue has normal motion without fasciculations.   Coordination:  Normal finger to nose and heel to shin. Normal rapid alternating movements.   Gait:    Heel-toe and tandem gait are normal.   Motor Observation:    No asymmetry, no atrophy, and no involuntary movements noted. Tone:    Normal muscle tone.    Posture:    Posture is normal. normal erect    Strength:    Strength is V/V in the upper and lower limbs.      Sensation: intact to LT     Reflex Exam:  DTR's:    Deep tendon reflexes in the upper and lower extremities are normal bilaterally.   Toes:    The toes are downgoing bilaterally.   Clonus:    Clonus is absent.     Assessment/Plan:  48 75 year old male who is here with his wife who went to the gym one morning and came home confused, couldn't remember being at the gym or even driving home. The memory loss spanned a 2 hours period, and lasted for several hours afterwards. He went to the emergency room and MRI of the brain did not show any strokes but did show  possible increased signal in the sigmoid sinus of uncertain significance, might be artifact. He regained all memory within a few hours of the time period in question. Neuro exam normal.  -Differential includes seizures or transient global amnesia however in both of these events he would not have regained his memory of the lost time. Will order an EEG. -MRI of the brain showed uncertain increased signal in the left transverse sinus, will order MRV of the brain to further investigate. - B12 was 138, there is literature of acute confusional episodes with B12 deficiency and this  is possible as well. Continue B12 shots, will check other labs today including B1. - Discussed driving restrictions today, unclear if this was a seizure or what kind of event it was or if he was impaired while driving so this is not clear. Per [facility] law he may be restricted for 6 months until event free.   Sarina Ill, MD  [facility] [address] [place],  [address]  Phone [phone] Fax [phone]

## 2016-03-07 ENCOUNTER — Telehealth: Payer: Self-pay | Admitting: *Deleted

## 2016-03-07 LAB — VITAMIN D PNL(25-HYDRXY+1,25-DIHY)-BLD
Vit D, 1,25-Dihydroxy: 46.8 pg/mL (ref 19.9–79.3)
Vit D, 25-Hydroxy: 25.8 ng/mL — ABNORMAL LOW (ref 30.0–100.0)

## 2016-03-07 LAB — VITAMIN B1: Thiamine: 155.7 nmol/L (ref 66.5–200.0)

## 2016-03-07 NOTE — Telephone Encounter (Signed)
Per [name], LVM informing him his labs look fine, Vitamin B1 is normal and Vitamin 1,25 D is the active form and that level is normal.  Left name, number for questions.

## 2016-03-13 ENCOUNTER — Ambulatory Visit
Admission: RE | Admit: 2016-03-13 | Discharge: 2016-03-13 | Disposition: A | Discharge status: Home / Self Care | Payer: Medicare Other | Source: Ambulatory Visit | Attending: Neurology | Admitting: Neurology

## 2016-03-13 DIAGNOSIS — G08 Intracranial and intraspinal phlebitis and thrombophlebitis: Secondary | ICD-10-CM

## 2016-03-13 DIAGNOSIS — F05 Delirium due to known physiological condition: Secondary | ICD-10-CM

## 2016-03-20 ENCOUNTER — Telehealth: Payer: Self-pay | Admitting: Neurology

## 2016-03-20 NOTE — Telephone Encounter (Signed)
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

## 2016-03-20 NOTE — Telephone Encounter (Signed)
Discussed with patient, will follow clinically. No treatment needed. In September can repeat a CTV to follow. This is likely chronic and unrelated to incident.

## 2016-04-02 ENCOUNTER — Telehealth: Payer: Self-pay | Admitting: Internal Medicine

## 2016-04-10 ENCOUNTER — Ambulatory Visit: Payer: Medicare Other

## 2016-04-11 ENCOUNTER — Telehealth: Payer: Self-pay | Admitting: *Deleted

## 2016-04-11 ENCOUNTER — Ambulatory Visit (INDEPENDENT_AMBULATORY_CARE_PROVIDER_SITE_OTHER): Payer: Medicare Other | Admitting: Neurology

## 2016-04-11 ENCOUNTER — Telehealth: Payer: Self-pay | Admitting: Neurology

## 2016-04-11 DIAGNOSIS — R413 Other amnesia: Secondary | ICD-10-CM

## 2016-04-11 DIAGNOSIS — R410 Disorientation, unspecified: Secondary | ICD-10-CM | POA: Diagnosis not present

## 2016-04-11 NOTE — Telephone Encounter (Signed)
Patient is calling to discuss his EEG results.

## 2016-04-11 NOTE — Procedures (Signed)
     History: Tyler Ortega is a 75 year old gentleman with a history of transient memory loss for about 2 hours. This occurred during a period of exercise. The patient has been found to have a low B12 level, he is being evaluated for possible transient global amnesia.  This is a routine EEG. No skull defects are noted. Medications include aspirin and Lipitor.  EEG classification: Dysrhythmia grade 1 generalized  Description of the recording: The background rhythms of this recording consists of a relatively well-modulated medium amplitude 7 Hz background activity that is reactive to eye opening and closure. As the record progresses, photic stimulation is performed, this results in a bilateral photic driving response. Hyperventilation is also performed resulting in a minimal buildup of the background rhythm activities without significant slowing seen. At no time during the recording does there appear to be evidence of spike or spike-wave discharges or evidence of focal slowing. EKG monitor shows no evidence of cardiac rhythm abnormalities with a heart rate of 66.  Impression: This is a mildly abnormal EEG recording secondary to 7 Hz background slowing. This is a nonspecific recording, and can be seen with any process that results in a dementia, or any mild toxic or metabolic encephalopathy. No epileptiform discharges were seen.

## 2016-04-11 NOTE — Telephone Encounter (Signed)
-----   Message from Melvenia Beam, MD sent at 04/11/2016  5:55 PM EDT ----- EEG did not show any epileptiform activity or any seizure activity.   (for documentation, I do not think the mild slowing is clinically significant thanks)

## 2016-04-11 NOTE — Telephone Encounter (Signed)
Called pt back. He said he was looking on mychart about results. He was looking the MRV test results, not EEG. He was looking at the wrong test. Advised he spoke to [name] about MRV on 03/20/16. He was worried because it mentioned a blockage. I reassured pt. Went over those results per [name] note. He stated he remembers discussing with [name]. Advised since he just had EEG done today, I checked and results not ready. [name] has to review first. He verbalized understanding.

## 2016-04-11 NOTE — Telephone Encounter (Signed)
Called and spoke to pt about results per [name] note. He verbalized understanding.

## 2016-04-15 ENCOUNTER — Ambulatory Visit: Payer: Medicare Other | Admitting: Internal Medicine

## 2016-04-18 DIAGNOSIS — L814 Other melanin hyperpigmentation: Secondary | ICD-10-CM | POA: Diagnosis not present

## 2016-04-18 DIAGNOSIS — L301 Dyshidrosis [pompholyx]: Secondary | ICD-10-CM | POA: Diagnosis not present

## 2016-04-18 DIAGNOSIS — L821 Other seborrheic keratosis: Secondary | ICD-10-CM | POA: Diagnosis not present

## 2016-04-18 DIAGNOSIS — D225 Melanocytic nevi of trunk: Secondary | ICD-10-CM | POA: Diagnosis not present

## 2016-04-19 ENCOUNTER — Encounter: Payer: Self-pay | Admitting: Internal Medicine

## 2016-04-19 ENCOUNTER — Ambulatory Visit (INDEPENDENT_AMBULATORY_CARE_PROVIDER_SITE_OTHER): Payer: Medicare Other | Admitting: Internal Medicine

## 2016-04-19 ENCOUNTER — Other Ambulatory Visit: Payer: Self-pay

## 2016-04-19 VITALS — BP 118/68 | HR 75 | Temp 97.7°F | Ht 68.0 in | Wt 169.1 lb

## 2016-04-19 DIAGNOSIS — R413 Other amnesia: Secondary | ICD-10-CM | POA: Diagnosis not present

## 2016-04-19 DIAGNOSIS — E870 Hyperosmolality and hypernatremia: Secondary | ICD-10-CM

## 2016-04-19 DIAGNOSIS — E538 Deficiency of other specified B group vitamins: Secondary | ICD-10-CM | POA: Diagnosis not present

## 2016-04-19 LAB — BASIC METABOLIC PANEL
BUN: 24 mg/dL (ref 7–25)
CO2: 25 mmol/L (ref 20–31)
Calcium: 9.2 mg/dL (ref 8.6–10.3)
Chloride: 107 mmol/L (ref 98–110)
Creat: 1.17 mg/dL (ref 0.70–1.18)
Glucose, Bld: 84 mg/dL (ref 65–99)
Potassium: 4.7 mmol/L (ref 3.5–5.3)
Sodium: 144 mmol/L (ref 135–146)

## 2016-04-19 MED ORDER — CYANOCOBALAMIN 1000 MCG/ML IJ SOLN
1000.0000 ug | Freq: Once | INTRAMUSCULAR | Status: AC
  Administered 2016-04-19: 1000 ug via INTRAMUSCULAR

## 2016-04-19 NOTE — Progress Notes (Signed)
Pre visit review using our clinic review tool, if applicable. No additional management support is needed unless otherwise documented below in the visit note. 

## 2016-04-19 NOTE — Patient Instructions (Signed)
GO TO THE LAB : Get the blood work     GO TO THE FRONT DESK Schedule your next appointment for a complete physical exam in 3-4 months, fasting   Take a over-the-counter B12 supplement every day

## 2016-04-19 NOTE — Progress Notes (Signed)
Subjective:    Patient ID: Tyler Ortega, male    DOB: 1940-10-13, 75 y.o.   MRN: [id number]  DOS:  04/19/2016 Type of visit - description : f/u Interval history: amnesia: Resolved, no further events. Saw neurology, note, MRIs and labs reviewed. B 12 deficiency: Currently only on oral supplements ( MV orally)    Review of Systems  Denies chest pain or difficulty breathing No nausea, vomiting, diarrhea.  Past Medical History  Diagnosis Date  . Hypertension   . Hyperlipidemia   . CAD (coronary artery disease)     MI 2011, seen @ [name], sees Public librarian (once a year)  . Skin cancer     R calf and the ear (?side)  . Arthritis     osteoarthritis -hips, back pain tx with Prednisone-last dose 09-04-15.  . Transfusion history     2nd Hip surgery    Past Surgical History  Procedure Laterality Date  . Inguinal hernia repair Left   . Hip surgery      x3 - L hip (initial hip replacement bleed)  . Colonoscopy  05/29/11    Normal  . Coronary artery bypass graft      05-2010-x4 vessel [facility]  . Vasectomy    . Total hip arthroplasty Right 09/12/2015    Procedure: RIGHT TOTAL HIP ARTHROPLASTY ANTERIOR APPROACH;  Surgeon: Paralee Cancel, MD;  Location: [facility];  Service: Orthopedics;  Laterality: Right;    Social History   Social History  . Marital Status: Married    Spouse Name: [name]  . Number of Children: 3  . Years of Education: Some colle   Occupational History  . Armed forces operational officer, Retired Other   Social History Main Topics  . Smoking status: Never Smoker   . Smokeless tobacco: Never Used  . Alcohol Use: Yes     Comment: rarely   . Drug Use: No  . Sexual Activity: Not on file   Other Topics Concern  . Not on file   Social History Narrative   1 Daughter lives in [place], lives w/ wife    Caffeine use: 1-2 cups coffee per day   Soda: rare           Medication List       This list is accurate as of: 04/19/16 11:59 PM.  Always use your most recent  med list.               amoxicillin 500 MG capsule  Commonly known as:  AMOXIL  Reported on 04/19/2016     aspirin 325 MG EC tablet  Take 1 tablet (325 mg total) by mouth 2 (two) times daily.     atorvastatin 40 MG tablet  Commonly known as:  LIPITOR  TAKE 1 TABLET EVERY DAY           Objective:   Physical Exam BP 118/68 mmHg  Pulse 75  Temp(Src) 97.7 F (36.5 C) (Oral)  Ht 5\' 8"  (1.727 m)  Wt 169 lb 2 oz (76.715 kg)  BMI 25.72 kg/m2  SpO2 97% General:   Well developed, well nourished . NAD.  HEENT:  Normocephalic . Face symmetric, atraumatic Lungs:  CTA B Normal respiratory effort, no intercostal retractions, no accessory muscle use. Heart: RRR,  no murmur.  No pretibial edema bilaterally  Skin: Not pale. Not jaundice Neurologic:  alert & oriented X3.  Speech normal, gait appropriate for age and unassisted Psych--  Cognition and judgment appear intact.  Cooperative with normal  attention span and concentration.  Behavior appropriate. No anxious or depressed appearing.      Assessment & Plan:   Assessment > HTN Hyperlipidemia CAD: MI and CABG 2011, cardiology Dr Prince Rome Transient amnesia: saw neuro. 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 (likely chronic, unrelated finding per neurology); EEG (-) B12 def dx 02-2106  PLAN: Transient amnesia : Workup negative except for a thrombosis seen on the MRI, an unrelated finding per neurology. Recommend observation for now. B12 deficiency: S/P 3 injections, currently on MVI. Plan: Injection today, take a OTC B12 supplement daily, recheck on return to the office. If not well controlled, would recommend shots monthly Hypernatremia:  Check a BMP RTC 3-4 months, fasting, CPX

## 2016-04-21 NOTE — Assessment & Plan Note (Signed)
Transient amnesia : Workup negative except for a thrombosis seen on the MRI, an unrelated finding per neurology. Recommend observation for now. B12 deficiency: S/P 3 injections, currently on MVI. Plan: Injection today, take a OTC B12 supplement daily, recheck on return to the office. If not well controlled, would recommend shots monthly Hypernatremia:  Check a BMP RTC 3-4 months, fasting, CPX

## 2016-04-23 NOTE — Telephone Encounter (Signed)
Completed.

## 2016-07-04 ENCOUNTER — Ambulatory Visit (INDEPENDENT_AMBULATORY_CARE_PROVIDER_SITE_OTHER): Payer: Medicare Other | Admitting: Neurology

## 2016-07-04 ENCOUNTER — Encounter: Payer: Self-pay | Admitting: Neurology

## 2016-07-04 VITALS — BP 135/72 | HR 65 | Ht 68.0 in | Wt 175.0 lb

## 2016-07-04 DIAGNOSIS — G454 Transient global amnesia: Secondary | ICD-10-CM | POA: Diagnosis not present

## 2016-07-04 NOTE — Progress Notes (Signed)
[id number]    Provider:  [name] Referring Provider: Colon Branch, MD Primary Care Physician:  [name], MD   CC:  Temporary memory loss  Interval history 07/04/2016: EEG normal. No other episodes. Reviewed data, discussed b12 deficiency, discussed TGA with patient which is what I believe patient likely had and this is very unlikely to occur again no issue with driving at this point, also reported are acute confusional episodes with low B12 and his was 138.  HPI:  Tyler Ortega is a 75 y.o. male here as a referral from [name] for transient memory loss for 2 hours. PMHx of CAD, HTN, HLD. He was exercising at the time. At the gym. He got home and couldn't remember being at the gym or driving home from the gym. Here with his wife who also provides information. Denies head trauma or inciting events, just exercising. Wife saw him and he was fine when he left. He went to the ED and he was fine there as well and a workup was complted.  He eventually did remember the time he spent in the gym. When he got home he was very confused, he walked back and forth in the home, what he kept saying was that he didn't feel right and he couldn't remember. Even afterwards for days he was not the same per wife. When he got home from the gym he was confused but knew his wife, knew where he was, didn't remember coming home from the gym or driving home. The memories all eventually came back. Everything totally came back. He never had an incident like this,  No alcohol problems. They are eating a balanced diet. Denies headaches, no weakness, no speech difficulties, no other focal neurologic deficits during the incident. Workup in the ED showed severe B12 deficiency at 138 and he is getting shots.   Reviewed notes, labs and imaging from outside physicians, which showed:  Personally reviewed imaging and agree with the following:  IMPRESSION: No acute infarct or intracranial hemorrhage.  Mild  to moderate chronic microvascular changes.  Mild global atrophy without hydrocephalus.  Increased signal within the left sigmoid sinus may be related to slow flow although thrombosis cannot be entirely excluded. MR venography or CT venography can be obtained for further delineation if clinically desired.  US carotid 02/2016:  IMPRESSION: Prominent calcified shadowing plaque RIGHT carotid bulb.  By velocity measurements, this corresponds to a less than 50% diameter stenosis.  Minimal plaque LEFT carotid bulb without evidence of hemodynamically significant stenosis.  Patient arrhythmia during exam.  Labs 02/05/2016: Vitamin B12 118, HgbA1c 6.4, TSh 1.18, CBC and CMp unremarkable  Review of Systems: Patient complains of symptoms per HPI as well as the following symptoms: memory loss. Pertinent negatives per HPI. All others negative.  Social History   Social History  . Marital status: Married    Spouse name: [name]  . Number of children: 3  . Years of education: Some colle   Occupational History  . Armed forces operational officer, Retired Other   Social History Main Topics  . Smoking status: Never Smoker  . Smokeless tobacco: Never Used  . Alcohol use Yes     Comment: rarely   . Drug use: No  . Sexual activity: Not on file   Other Topics Concern  . Not on file   Social History Narrative   1 Daughter lives in [place], lives w/ wife    Caffeine use: 1-2 cups coffee per day   Soda:  rare       Family History  Problem Relation Age of Onset  . Coronary artery disease Neg Hx   . Diabetes Neg Hx   . Colon cancer Neg Hx   . Prostate cancer Neg Hx   . Seizures Neg Hx   . Multiple sclerosis Neg Hx   . Migraines Neg Hx   . Dementia Neg Hx     Past Medical History:  Diagnosis Date  . Arthritis    osteoarthritis -hips, back pain tx with Prednisone-last dose 09-04-15.  Marland Kitchen CAD (coronary artery disease)    MI 2011, seen @ [name], sees Public librarian (once a year)  . Hyperlipidemia     . Hypertension   . Skin cancer    R calf and the ear (?side)  . Transfusion history    2nd Hip surgery    Past Surgical History:  Procedure Laterality Date  . COLONOSCOPY  05/29/11   Normal  . CORONARY ARTERY BYPASS GRAFT     05-2010-x4 vessel -[place]     x3 - L hip (initial hip replacement bleed)  . INGUINAL HERNIA REPAIR Left   . TOTAL HIP ARTHROPLASTY Right 09/12/2015   Procedure: RIGHT TOTAL HIP ARTHROPLASTY ANTERIOR APPROACH;  Surgeon: Paralee Cancel, MD;  Location: [facility];  Service: Orthopedics;  Laterality: Right;  Marland Kitchen VASECTOMY      Current Outpatient Prescriptions  Medication Sig Dispense Refill  . amoxicillin (AMOXIL) 500 MG capsule Reported on 04/19/2016  0  . aspirin EC 325 MG EC tablet Take 1 tablet (325 mg total) by mouth 2 (two) times daily. (Patient taking differently: Take 325 mg by mouth daily. ) 60 tablet 0  . atorvastatin (LIPITOR) 40 MG tablet TAKE 1 TABLET EVERY DAY 30 tablet 0   No current facility-administered medications for this visit.     Allergies as of 07/04/2016  . (No Known Allergies)    Vitals: BP 135/72 (BP Location: Right Arm, Patient Position: Sitting, Cuff Size: Normal)   Pulse 65   Ht 5\' 8"  (1.727 m)   Wt 175 lb (79.4 kg)   BMI 26.61 kg/m  Last Weight:  Wt Readings from Last 1 Encounters:  07/04/16 175 lb (79.4 kg)   Last Height:   Ht Readings from Last 1 Encounters:  07/04/16 5\' 8"  (1.727 m)     Physical exam: Exam: Gen: NAD, conversant, well nourised,  well groomed                     CV: RRR, no MRG. No Carotid Bruits. No peripheral edema, warm, nontender Eyes: Conjunctivae clear without exudates or hemorrhage  Neuro: Detailed Neurologic Exam  Speech:    Speech is normal; fluent and spontaneous with normal comprehension.  Cognition:  MMSE - Mini Mental State Exam 03/05/2016  Orientation to time 4  Orientation to Place 5  Registration 3  Attention/ Calculation 5  Recall 3  Language- name  2 objects 2  Language- repeat 1  Language- follow 3 step command 3  Language- read & follow direction 1  Write a sentence 1  Copy design 1  Total score 29      The patient is oriented to person, place, and time;     recent and remote memory intact;     language fluent;     normal attention, concentration,     fund of knowledge Cranial Nerves:    The pupils are equal, round, and reactive to light.  The fundi are normal and spontaneous venous pulsations are present. Visual fields are full to finger confrontation. Extraocular movements are intact. Trigeminal sensation is intact and the muscles of mastication are normal. The face is symmetric. The palate elevates in the midline. Hearing intact. Voice is normal. Shoulder shrug is normal. The tongue has normal motion without fasciculations.   Coordination:    Normal finger to nose and heel to shin. Normal rapid alternating movements.   Gait:    Heel-toe and tandem gait are normal.   Motor Observation:    No asymmetry, no atrophy, and no involuntary movements noted. Tone:    Normal muscle tone.    Posture:    Posture is normal. normal erect    Strength:    Strength is V/V in the upper and lower limbs.      Sensation: intact to LT     Reflex Exam:  DTR's:    Deep tendon reflexes in the upper and lower extremities are normal bilaterally.   Toes:    The toes are downgoing bilaterally.   Clonus:    Clonus is absent.     Assessment/Plan:  68 75 year old male who is here with his wife who went to the gym one morning and came home confused, couldn't remember being at the gym or even driving home. The memory loss spanned a 2 hours period, and lasted for several hours afterwards. He went to the emergency room and MRI of the brain did not show any strokes but did show  possible increased signal in the sigmoid sinus of uncertain significance, might be artifact. He regained all memory within a few hours of the time period in  question. Neuro exam normal.    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 No management indicated. - B12 was 138, there is literature of acute confusional episodes with B12 deficiency and this is possible as well. Continue B12 shots.    Sarina Ill, MD  [facility] [address] [place], [place] [address]  Phone [phone] Fax [phone]  A total of 30 minutes was spent face-to-face with this patient. Over half this time was spent on counseling patient on the TGA diagnosis and different diagnostic and therapeutic options available.

## 2016-09-16 ENCOUNTER — Encounter: Payer: Self-pay | Admitting: Internal Medicine

## 2016-09-16 ENCOUNTER — Ambulatory Visit (INDEPENDENT_AMBULATORY_CARE_PROVIDER_SITE_OTHER): Payer: Medicare Other | Admitting: Internal Medicine

## 2016-09-16 VITALS — BP 118/72 | HR 74 | Temp 98.1°F | Resp 14 | Ht 68.0 in | Wt 174.0 lb

## 2016-09-16 DIAGNOSIS — E785 Hyperlipidemia, unspecified: Secondary | ICD-10-CM | POA: Diagnosis not present

## 2016-09-16 DIAGNOSIS — Z0000 Encounter for general adult medical examination without abnormal findings: Secondary | ICD-10-CM

## 2016-09-16 DIAGNOSIS — Z23 Encounter for immunization: Secondary | ICD-10-CM

## 2016-09-16 DIAGNOSIS — I25810 Atherosclerosis of coronary artery bypass graft(s) without angina pectoris: Secondary | ICD-10-CM

## 2016-09-16 DIAGNOSIS — Z125 Encounter for screening for malignant neoplasm of prostate: Secondary | ICD-10-CM | POA: Diagnosis not present

## 2016-09-16 DIAGNOSIS — R739 Hyperglycemia, unspecified: Secondary | ICD-10-CM

## 2016-09-16 DIAGNOSIS — E538 Deficiency of other specified B group vitamins: Secondary | ICD-10-CM

## 2016-09-16 LAB — HEMOGLOBIN A1C: Hgb A1c MFr Bld: 6 % (ref 4.6–6.5)

## 2016-09-16 LAB — LIPID PANEL
Cholesterol: 146 mg/dL (ref 0–200)
HDL: 60.3 mg/dL (ref >39.00)
LDL Cholesterol: 71 mg/dL (ref 0–99)
NonHDL: 85.75
Total CHOL/HDL Ratio: 2
Triglycerides: 73 mg/dL (ref 0.0–149.0)
VLDL: 14.6 mg/dL (ref 0.0–40.0)

## 2016-09-16 LAB — PSA: PSA: 1.95 ng/mL (ref 0.10–4.00)

## 2016-09-16 LAB — VITAMIN B12: Vitamin B-12: 399 pg/mL (ref 211–911)

## 2016-09-16 NOTE — Assessment & Plan Note (Signed)
Hyperglycemia: excellent lifestyle. Exercises qd. Check a A1c  HTN: Normal BP, on no meds  Hyperlipidemia : continue statins. Labs CAD: Asx Transit amnesia: Last neurology visit 3 months ago. Normal neuro exam. B12 deficiency: On supplements. Checking lab MSK: hand sx- could be related to DJD or nerve entrapment. Shoulder pain related to a impingement?. Observation, call if needed for a referral Cough: Lung exam normal. Observation RTC 6-8 months

## 2016-09-16 NOTE — Assessment & Plan Note (Addendum)
Td 2012; Pneumonia shot: 2015; prevnar: 2016. Flu shot today zostavax discussed before CCS:  cscope normal 2012, next 10 years  DRE normal today. Check a PSA  Labs: FLP, PSA, A1c, B12 Cont healthy life style

## 2016-09-16 NOTE — Progress Notes (Signed)
Subjective:    Patient ID: Tyler Ortega, male    DOB: 02-05-1941, 75 y.o.   MRN: [id number]  DOS:  09/16/2016 Type of visit - description :  Interval history: Hyperlipidemia: Good compliance with medications. Transient amnesia: Note from neurology reviewed Has several concerns: Several years history of chronic, dry, mild cough. Is like a "tickle" in the throat. Denies GERD, allergies, weight loss or fever. Also has noted his semen  be more watery. Denied blood in the semen or any other urinary symptoms Also complained about his hands feel "tight" laterally at nighttime and early morning, symptoms go away after he started uses his hands. No actual swelling, redness. More noticeable on the ulnar aspect of the hands. No elbow pain. He does have some right shoulder pain, mostly when he goes to the gym and "push it too hard". Pain decreases the days he skips the gym. No neck pain.   Review of Systems  Constitutional: No fever. No chills. No unexplained wt changes. No unusual sweats  HEENT: No dental problems, no ear discharge, no facial swelling, no voice changes. No eye discharge, no eye  redness , no  intolerance to light   Respiratory: No wheezing , no  difficulty breathing. See above   Cardiovascular: No CP, no leg swelling , no  Palpitations  GI: no nausea, no vomiting, no diarrhea , no  abdominal pain.  No blood in the stools. No dysphagia, no odynophagia    Endocrine: No polyphagia, no polyuria , no polydipsia  GU: No dysuria, gross hematuria, difficulty urinating. No urinary urgency, no frequency. See above   Musculoskeletal:  See above   Skin: No change in the color of the skin, palor , no  Rash  Allergic, immunologic: No environmental allergies , no  food allergies  Neurological: No dizziness no  syncope. No headaches. No diplopia, no slurred, no slurred speech, no motor deficits, no facial  Numbness  Hematological: No enlarged lymph nodes, no easy bruising , no  unusual bleedings  Psychiatry: No suicidal ideas, no hallucinations, no beavior problems, no confusion.  No unusual/severe anxiety, no depression    Past Medical History:  Diagnosis Date  . Arthritis    osteoarthritis -hips, back pain tx with Prednisone-last dose 09-04-15.  Marland Kitchen CAD (coronary artery disease)    MI 2011, seen @ [name], sees Public librarian (once a year)  . Hyperlipidemia   . Hypertension   . Skin cancer    R calf and the ear (?side)  . Transfusion history    2nd Hip surgery    Past Surgical History:  Procedure Laterality Date  . COLONOSCOPY  05/29/11   Normal  . CORONARY ARTERY BYPASS GRAFT     05-2010-x4 vessel -[place]     x3 - L hip (initial hip replacement bleed)  . INGUINAL HERNIA REPAIR Left   . TOTAL HIP ARTHROPLASTY Right 09/12/2015   Procedure: RIGHT TOTAL HIP ARTHROPLASTY ANTERIOR APPROACH;  Surgeon: Paralee Cancel, MD;  Location: [facility];  Service: Orthopedics;  Laterality: Right;  Marland Kitchen VASECTOMY      Social History   Social History  . Marital status: Married    Spouse name: [name]  . Number of children: 3  . Years of education: Some colle   Occupational History  . Armed forces operational officer, Retired Other   Social History Main Topics  . Smoking status: Never Smoker  . Smokeless tobacco: Never Used  . Alcohol use Yes  Comment: rarely   . Drug use: No  . Sexual activity: Not on file   Other Topics Concern  . Not on file   Social History Narrative   1 Daughter lives in [place], lives w/ wife    Caffeine use: 1-2 cups coffee per day   Soda: rare        Family History  Problem Relation Age of Onset  . Coronary artery disease Neg Hx   . Diabetes Neg Hx   . Colon cancer Neg Hx   . Prostate cancer Neg Hx   . Seizures Neg Hx   . Multiple sclerosis Neg Hx   . Migraines Neg Hx   . Dementia Neg Hx        Medication List       Accurate as of 09/16/16 12:57 PM. Always use your most recent med list.          amoxicillin  500 MG capsule Commonly known as:  AMOXIL Reported on 04/19/2016   aspirin 325 MG tablet Take 325 mg by mouth daily.   aspirin 325 MG EC tablet Take 1 tablet (325 mg total) by mouth 2 (two) times daily.   atorvastatin 40 MG tablet Commonly known as:  LIPITOR TAKE 1 TABLET EVERY DAY          Objective:   Physical Exam BP 118/72 (BP Location: Left Arm, Patient Position: Sitting, Cuff Size: Normal)   Pulse 74   Temp 98.1 F (36.7 C) (Oral)   Resp 14   Ht 5\' 8"  (1.727 m)   Wt 174 lb (78.9 kg)   SpO2 98%   BMI 26.46 kg/m   General:   Well developed, well nourished . NAD.  Neck: No  thyromegaly  HEENT:  Normocephalic . Face symmetric, atraumatic Lungs:  CTA B Normal respiratory effort, no intercostal retractions, no accessory muscle use. Heart: RRR,  no murmur.  No pretibial edema bilaterally  Abdomen:  Not distended, soft, non-tender. No rebound or rigidity.   Skin: Exposed areas without rash. Not pale. Not jaundice MSK: Hands and wrists with some changes related to DJD but no redness, synovitis. Shoulders symmetric, minimal pain of the right shoulder w/ ROM Rectal:  External abnormalities: none. Normal sphincter tone. No rectal masses or tenderness.  Stool brown  Prostate: Prostate gland firm and smooth, no enlargement, nodularity, tenderness, mass, asymmetry or induration.  Neurologic:  alert & oriented X3.  Speech normal, gait appropriate for age and unassisted Strength symmetric and appropriate for age.  Psych: Cognition and judgment appear intact.  Cooperative with normal attention span and concentration.  Behavior appropriate. No anxious or depressed appearing.    Assessment & Plan:   Assessment Hyperglycemia: A1c 6.45/2017 HTN Hyperlipidemia CAD: MI and CABG 2011, cardiology Dr Prince Rome Transient amnesia: saw neuro. + Ca  right carotid bulb, MRI brain: Atrophy,partial thrombosis of the L transverse and complete thrombosis of the left sigmoid sinus and  jugular vein. (likely chronic, unrelated finding per neurology); EEG (-) B12 def dx 02-2106 ABX pre dental work request by ortho d/t hip surgeries    PLAN: Hyperglycemia: excellent lifestyle. Exercises qd. Check a A1c  HTN: Normal BP, on no meds  Hyperlipidemia : continue statins. Labs CAD: Asx Transit amnesia: Last neurology visit 3 months ago. Normal neuro exam. B12 deficiency: On supplements. Checking lab MSK: hand sx- could be related to DJD or nerve entrapment. Shoulder pain related to a impingement?. Observation, call if needed for a referral Cough: Lung exam normal. Observation  RTC 6-8 months

## 2016-09-16 NOTE — Progress Notes (Signed)
Pre visit review using our clinic review tool, if applicable. No additional management support is needed unless otherwise documented below in the visit note. 

## 2016-09-16 NOTE — Patient Instructions (Signed)
Get your blood work before you leave   Please  schedule a visit with one of our registered nurses for a  Medicare wellness.  Next visit with me 6-8 months    Fall Prevention and Home Safety Falls cause injuries and can affect all age groups. It is possible to use preventive measures to significantly decrease the likelihood of falls. There are many simple measures which can make your home safer and prevent falls. OUTDOORS  Repair cracks and edges of walkways and driveways.  Remove high doorway thresholds.  Trim shrubbery on the main path into your home.  Have good outside lighting.  Clear walkways of tools, rocks, debris, and clutter.  Check that handrails are not broken and are securely fastened. Both sides of steps should have handrails.  Have leaves, snow, and ice cleared regularly.  Use sand or salt on walkways during winter months.  In the garage, clean up grease or oil spills. BATHROOM  Install night lights.  Install grab bars by the toilet and in the tub and shower.  Use non-skid mats or decals in the tub or shower.  Place a plastic non-slip stool in the shower to sit on, if needed.  Keep floors dry and clean up all water on the floor immediately.  Remove soap buildup in the tub or shower on a regular basis.  Secure bath mats with non-slip, double-sided rug tape.  Remove throw rugs and tripping hazards from the floors. BEDROOMS  Install night lights.  Make sure a bedside light is easy to reach.  Do not use oversized bedding.  Keep a telephone by your bedside.  Have a firm chair with side arms to use for getting dressed.  Remove throw rugs and tripping hazards from the floor. KITCHEN  Keep handles on pots and pans turned toward the center of the stove. Use back burners when possible.  Clean up spills quickly and allow time for drying.  Avoid walking on wet floors.  Avoid hot utensils and knives.  Position shelves so they are not too high or  low.  Place commonly used objects within easy reach.  If necessary, use a sturdy step stool with a grab bar when reaching.  Keep electrical cables out of the way.  Do not use floor polish or wax that makes floors slippery. If you must use wax, use non-skid floor wax.  Remove throw rugs and tripping hazards from the floor. STAIRWAYS  Never leave objects on stairs.  Place handrails on both sides of stairways and use them. Fix any loose handrails. Make sure handrails on both sides of the stairways are as long as the stairs.  Check carpeting to make sure it is firmly attached along stairs. Make repairs to worn or loose carpet promptly.  Avoid placing throw rugs at the top or bottom of stairways, or properly secure the rug with carpet tape to prevent slippage. Get rid of throw rugs, if possible.  Have an electrician put in a light switch at the top and bottom of the stairs. OTHER FALL PREVENTION TIPS  Wear low-heel or rubber-soled shoes that are supportive and fit well. Wear closed toe shoes.  When using a stepladder, make sure it is fully opened and both spreaders are firmly locked. Do not climb a closed stepladder.  Add color or contrast paint or tape to grab bars and handrails in your home. Place contrasting color strips on first and last steps.  Learn and use mobility aids as needed. Install an Dealer emergency  response system.  Turn on lights to avoid dark areas. Replace light bulbs that burn out immediately. Get light switches that glow.  Arrange furniture to create clear pathways. Keep furniture in the same place.  Firmly attach carpet with non-skid or double-sided tape.  Eliminate uneven floor surfaces.  Select a carpet pattern that does not visually hide the edge of steps.  Be aware of all pets. OTHER HOME SAFETY TIPS  Set the water temperature for 120 F (48.8 C).  Keep emergency numbers on or near the telephone.  Keep smoke detectors on every level of the  home and near sleeping areas. Document Released: 09/13/2002 Document Revised: 03/24/2012 Document Reviewed: 12/13/2011 [facility] Patient Information 2015 [place], [place]. This information is not intended to replace advice given to you by your health care provider. Make sure you discuss any questions you have with your health care provider.   Preventive Care for Adults Ages 12 and over  Blood pressure check.** / Every 1 to 2 years.  Lipid and cholesterol check.**/ Every 5 years beginning at age 5.  Lung cancer screening. / Every year if you are aged 32-80 years and have a 30-pack-year history of smoking and currently smoke or have quit within the past 15 years. Yearly screening is stopped once you have quit smoking for at least 15 years or develop a health problem that would prevent you from having lung cancer treatment.  Fecal occult blood test (FOBT) of stool. / Every year beginning at age 14 and continuing until age 36. You may not have to do this test if you get a colonoscopy every 10 years.  Flexible sigmoidoscopy** or colonoscopy.** / Every 5 years for a flexible sigmoidoscopy or every 10 years for a colonoscopy beginning at age 2 and continuing until age 27.  Hepatitis C blood test.** / For all people born from [birth year] through 1964 and any individual with known risks for hepatitis C.  Abdominal aortic aneurysm (AAA) screening.** / A one-time screening for ages 65 to 48 years who are current or former smokers.  Skin self-exam. / Monthly.  Influenza vaccine. / Every year.  Tetanus, diphtheria, and acellular pertussis (Tdap/Td) vaccine.** / 1 dose of Td every 10 years.  Varicella vaccine.** / Consult your health care provider.  Zoster vaccine.** / 1 dose for adults aged 21 years or older.  Pneumococcal 13-valent conjugate (PCV13) vaccine.** / Consult your health care provider.  Pneumococcal polysaccharide (PPSV23) vaccine.** / 1 dose for all adults aged 52 years and  older.  Meningococcal vaccine.** / Consult your health care provider.  Hepatitis A vaccine.** / Consult your health care provider.  Hepatitis B vaccine.** / Consult your health care provider.  Haemophilus influenzae type b (Hib) vaccine.** / Consult your health care provider. **Family history and personal history of risk and conditions may change your health care provider's recommendations. Document Released: 11/19/2001 Document Revised: 09/28/2013 Document Reviewed: 02/18/2011 [facility] Patient Information 2015 [place], [place]. This information is not intended to replace advice given to you by your health care provider. Make sure you discuss any questions you have with your health care provider.

## 2016-12-12 DIAGNOSIS — I10 Essential (primary) hypertension: Secondary | ICD-10-CM | POA: Diagnosis not present

## 2016-12-12 DIAGNOSIS — Z951 Presence of aortocoronary bypass graft: Secondary | ICD-10-CM | POA: Diagnosis not present

## 2016-12-12 DIAGNOSIS — E785 Hyperlipidemia, unspecified: Secondary | ICD-10-CM | POA: Diagnosis not present

## 2016-12-12 DIAGNOSIS — I2510 Atherosclerotic heart disease of native coronary artery without angina pectoris: Secondary | ICD-10-CM | POA: Diagnosis not present

## 2017-04-18 DIAGNOSIS — L814 Other melanin hyperpigmentation: Secondary | ICD-10-CM | POA: Diagnosis not present

## 2017-04-18 DIAGNOSIS — D1801 Hemangioma of skin and subcutaneous tissue: Secondary | ICD-10-CM | POA: Diagnosis not present

## 2017-04-18 DIAGNOSIS — L821 Other seborrheic keratosis: Secondary | ICD-10-CM | POA: Diagnosis not present

## 2017-04-18 DIAGNOSIS — D239 Other benign neoplasm of skin, unspecified: Secondary | ICD-10-CM | POA: Diagnosis not present

## 2017-05-19 ENCOUNTER — Ambulatory Visit: Payer: Medicare Other | Admitting: Internal Medicine

## 2017-05-29 ENCOUNTER — Encounter: Payer: Self-pay | Admitting: Internal Medicine

## 2017-05-29 ENCOUNTER — Ambulatory Visit (INDEPENDENT_AMBULATORY_CARE_PROVIDER_SITE_OTHER): Payer: Medicare Other | Admitting: Internal Medicine

## 2017-05-29 VITALS — BP 124/70 | HR 74 | Temp 97.7°F | Resp 14 | Ht 68.0 in | Wt 171.4 lb

## 2017-05-29 DIAGNOSIS — E785 Hyperlipidemia, unspecified: Secondary | ICD-10-CM

## 2017-05-29 DIAGNOSIS — I10 Essential (primary) hypertension: Secondary | ICD-10-CM | POA: Diagnosis not present

## 2017-05-29 DIAGNOSIS — E538 Deficiency of other specified B group vitamins: Secondary | ICD-10-CM

## 2017-05-29 DIAGNOSIS — R739 Hyperglycemia, unspecified: Secondary | ICD-10-CM

## 2017-05-29 LAB — COMPREHENSIVE METABOLIC PANEL
ALT: 18 U/L (ref 0–53)
AST: 23 U/L (ref 0–37)
Albumin: 4.1 g/dL (ref 3.5–5.2)
Alkaline Phosphatase: 63 U/L (ref 39–117)
BUN: 25 mg/dL — ABNORMAL HIGH (ref 6–23)
CO2: 28 mEq/L (ref 19–32)
Calcium: 9.3 mg/dL (ref 8.4–10.5)
Chloride: 106 mEq/L (ref 96–112)
Creatinine, Ser: 1.29 mg/dL (ref 0.40–1.50)
GFR: 57.67 mL/min — ABNORMAL LOW (ref >60.00)
Glucose, Bld: 106 mg/dL — ABNORMAL HIGH (ref 70–99)
Potassium: 4.5 mEq/L (ref 3.5–5.1)
Sodium: 140 mEq/L (ref 135–145)
Total Bilirubin: 0.6 mg/dL (ref 0.2–1.2)
Total Protein: 6.8 g/dL (ref 6.0–8.3)

## 2017-05-29 LAB — CBC WITH DIFFERENTIAL/PLATELET
Basophils Absolute: 0.1 10*3/uL (ref 0.0–0.1)
Basophils Relative: 1 % (ref 0.0–3.0)
Eosinophils Absolute: 0.3 10*3/uL (ref 0.0–0.7)
Eosinophils Relative: 5 % (ref 0.0–5.0)
HCT: 45.2 % (ref 39.0–52.0)
Hemoglobin: 14.9 g/dL (ref 13.0–17.0)
Lymphocytes Relative: 23.3 % (ref 12.0–46.0)
Lymphs Abs: 1.3 10*3/uL (ref 0.7–4.0)
MCHC: 32.9 g/dL (ref 30.0–36.0)
MCV: 100.8 fl — ABNORMAL HIGH (ref 78.0–100.0)
Monocytes Absolute: 0.5 10*3/uL (ref 0.1–1.0)
Monocytes Relative: 8.3 % (ref 3.0–12.0)
Neutro Abs: 3.5 10*3/uL (ref 1.4–7.7)
Neutrophils Relative %: 62.4 % (ref 43.0–77.0)
Platelets: 158 10*3/uL (ref 150.0–400.0)
RBC: 4.48 Mil/uL (ref 4.22–5.81)
RDW: 14.1 % (ref 11.5–15.5)
WBC: 5.7 10*3/uL (ref 4.0–10.5)

## 2017-05-29 LAB — LIPID PANEL
Cholesterol: 133 mg/dL (ref 0–200)
HDL: 55.6 mg/dL (ref >39.00)
LDL Cholesterol: 66 mg/dL (ref 0–99)
NonHDL: 77.37
Total CHOL/HDL Ratio: 2
Triglycerides: 58 mg/dL (ref 0.0–149.0)
VLDL: 11.6 mg/dL (ref 0.0–40.0)

## 2017-05-29 LAB — VITAMIN B12: Vitamin B-12: 997 pg/mL — ABNORMAL HIGH (ref 211–911)

## 2017-05-29 LAB — HEMOGLOBIN A1C: Hgb A1c MFr Bld: 5.9 % (ref 4.6–6.5)

## 2017-05-29 NOTE — Progress Notes (Signed)
Pre visit review using our clinic review tool, if applicable. No additional management support is needed unless otherwise documented below in the visit note. 

## 2017-05-29 NOTE — Assessment & Plan Note (Signed)
Hyperglycemia: Diet control, check A1c HTN: On no meds, checking a CMP, CBC. Hyperlipidemia: Continue with Lipitor, checking an FLP CAD: Asx, saw cardiology, felt to be stable. B12 deficiency: On oral supplements, checking labs Recommend a flu shot this fall RTC 6 months for a checkup

## 2017-05-29 NOTE — Patient Instructions (Signed)
GO TO THE LAB : Get the blood work     GO TO THE FRONT DESK Schedule your next appointment for a  Check up in 6 months  Don't forget a flu shot this fall

## 2017-05-29 NOTE — Progress Notes (Signed)
Subjective:    Patient ID: Tyler Ortega, male    DOB: 11/20/1940, 76 y.o.   MRN: [id number]  DOS:  05/29/2017 Type of visit - description : rov Interval history: In general feels well. Reports no concerns, still very active. Cardiology note reviewed. Good compliance with medication. Ambulatory BPs always within normal   Review of Systems Denies chest pain, difficulty breathing. No lower extremity edema No issues with amnesia since the last visit No aches or pains.  Past Medical History:  Diagnosis Date  . Arthritis    osteoarthritis -hips, back pain tx with Prednisone-last dose 09-04-15.  Marland Kitchen CAD (coronary artery disease)    MI 2011, seen @ [name], sees Public librarian (once a year)  . Hyperlipidemia   . Hypertension   . Skin cancer    R calf and the ear (?side)  . Transfusion history    2nd Hip surgery    Past Surgical History:  Procedure Laterality Date  . COLONOSCOPY  05/29/11   Normal  . CORONARY ARTERY BYPASS GRAFT     05-2010-x4 vessel -[place]     x3 - L hip (initial hip replacement bleed)  . INGUINAL HERNIA REPAIR Left   . TOTAL HIP ARTHROPLASTY Right 09/12/2015   Procedure: RIGHT TOTAL HIP ARTHROPLASTY ANTERIOR APPROACH;  Surgeon: Paralee Cancel, MD;  Location: [facility];  Service: Orthopedics;  Laterality: Right;  Marland Kitchen VASECTOMY      Social History   Social History  . Marital status: Married    Spouse name: [name]  . Number of children: 3  . Years of education: Some colle   Occupational History  . Armed forces operational officer, Retired Other   Social History Main Topics  . Smoking status: Never Smoker  . Smokeless tobacco: Never Used  . Alcohol use Yes     Comment: rarely   . Drug use: No  . Sexual activity: Not on file   Other Topics Concern  . Not on file   Social History Narrative   1 Daughter lives in [place], lives w/ wife    Caffeine use: 1-2 cups coffee per day   Soda: rare         Allergies as of 05/29/2017   No Known  Allergies     Medication List       Accurate as of 05/29/17  6:25 PM. Always use your most recent med list.          aspirin 325 MG EC tablet Take 325 mg by mouth daily.   atorvastatin 40 MG tablet Commonly known as:  LIPITOR TAKE 1 TABLET EVERY DAY            Discharge Care Instructions        Start     Ordered   05/29/17 0000  Comp Met (CMET)     05/29/17 0908   05/29/17 0000  Lipid panel     05/29/17 0908   05/29/17 0000  CBC w/Diff     05/29/17 0908   05/29/17 0000  Hemoglobin A1c     05/29/17 0908   05/29/17 0000  B12     05/29/17 0908         Objective:   Physical Exam BP 124/70 (BP Location: Left Arm, Patient Position: Sitting, Cuff Size: Small)   Pulse 74   Temp 97.7 F (36.5 C) (Oral)   Resp 14   Ht '5\' 8"'  (1.727 m)   Wt 171 lb 6 oz (77.7 kg)  SpO2 97%   BMI 26.06 kg/m  General:   Well developed, well nourished . NAD.  HEENT:  Normocephalic . Face symmetric, atraumatic Lungs:  CTA B Normal respiratory effort, no intercostal retractions, no accessory muscle use. Heart: RRR,  no murmur.  No pretibial edema bilaterally  Skin: Not pale. Not jaundice Neurologic:  alert & oriented X3.  Speech normal, gait appropriate for age and unassisted Psych--  Cognition and judgment appear intact.  Cooperative with normal attention span and concentration.  Behavior appropriate. No anxious or depressed appearing.      Assessment & Plan:   Assessment Hyperglycemia: A1c 6.45/2017 HTN Hyperlipidemia CAD: MI and CABG 2011, cardiology Dr Prince Rome Transient amnesia: saw neuro. 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 (likely chronic, unrelated finding per neurology); EEG (-) B12 def dx 02-2106 ABX pre dental work request by ortho d/t hip surgeries    PLAN: Hyperglycemia: Diet control, check A1c HTN: On no meds, checking a CMP,  CBC. Hyperlipidemia: Continue with Lipitor, checking an FLP CAD: Asx, saw cardiology, felt to be stable. B12 deficiency: On oral supplements, checking labs Recommend a flu shot this fall RTC 6 months for a checkup

## 2017-07-04 DIAGNOSIS — I2584 Coronary atherosclerosis due to calcified coronary lesion: Secondary | ICD-10-CM | POA: Diagnosis not present

## 2017-07-04 DIAGNOSIS — I25110 Atherosclerotic heart disease of native coronary artery with unstable angina pectoris: Secondary | ICD-10-CM | POA: Diagnosis not present

## 2017-07-04 DIAGNOSIS — I10 Essential (primary) hypertension: Secondary | ICD-10-CM | POA: Diagnosis not present

## 2017-07-04 DIAGNOSIS — R0602 Shortness of breath: Secondary | ICD-10-CM | POA: Diagnosis not present

## 2017-07-04 DIAGNOSIS — K922 Gastrointestinal hemorrhage, unspecified: Secondary | ICD-10-CM | POA: Diagnosis not present

## 2017-07-04 DIAGNOSIS — R0600 Dyspnea, unspecified: Secondary | ICD-10-CM | POA: Diagnosis not present

## 2017-07-04 DIAGNOSIS — I517 Cardiomegaly: Secondary | ICD-10-CM | POA: Diagnosis not present

## 2017-07-04 DIAGNOSIS — R7989 Other specified abnormal findings of blood chemistry: Secondary | ICD-10-CM | POA: Diagnosis not present

## 2017-07-04 DIAGNOSIS — K2211 Ulcer of esophagus with bleeding: Secondary | ICD-10-CM | POA: Diagnosis not present

## 2017-07-04 DIAGNOSIS — K920 Hematemesis: Secondary | ICD-10-CM | POA: Diagnosis not present

## 2017-07-04 DIAGNOSIS — I200 Unstable angina: Secondary | ICD-10-CM | POA: Diagnosis not present

## 2017-07-04 DIAGNOSIS — I2582 Chronic total occlusion of coronary artery: Secondary | ICD-10-CM | POA: Diagnosis not present

## 2017-07-04 DIAGNOSIS — I519 Heart disease, unspecified: Secondary | ICD-10-CM | POA: Diagnosis not present

## 2017-07-04 DIAGNOSIS — E785 Hyperlipidemia, unspecified: Secondary | ICD-10-CM | POA: Diagnosis not present

## 2017-07-04 DIAGNOSIS — I25118 Atherosclerotic heart disease of native coronary artery with other forms of angina pectoris: Secondary | ICD-10-CM | POA: Diagnosis not present

## 2017-07-04 DIAGNOSIS — R079 Chest pain, unspecified: Secondary | ICD-10-CM | POA: Diagnosis not present

## 2017-07-04 DIAGNOSIS — K5900 Constipation, unspecified: Secondary | ICD-10-CM | POA: Diagnosis not present

## 2017-07-04 DIAGNOSIS — R578 Other shock: Secondary | ICD-10-CM | POA: Diagnosis not present

## 2017-07-04 DIAGNOSIS — Z96643 Presence of artificial hip joint, bilateral: Secondary | ICD-10-CM | POA: Diagnosis not present

## 2017-07-04 DIAGNOSIS — I2510 Atherosclerotic heart disease of native coronary artery without angina pectoris: Secondary | ICD-10-CM | POA: Diagnosis not present

## 2017-07-04 DIAGNOSIS — I340 Nonrheumatic mitral (valve) insufficiency: Secondary | ICD-10-CM | POA: Diagnosis not present

## 2017-07-04 DIAGNOSIS — K458 Other specified abdominal hernia without obstruction or gangrene: Secondary | ICD-10-CM | POA: Diagnosis not present

## 2017-07-04 DIAGNOSIS — K2970 Gastritis, unspecified, without bleeding: Secondary | ICD-10-CM | POA: Diagnosis not present

## 2017-07-04 DIAGNOSIS — Z951 Presence of aortocoronary bypass graft: Secondary | ICD-10-CM | POA: Diagnosis not present

## 2017-07-04 DIAGNOSIS — D62 Acute posthemorrhagic anemia: Secondary | ICD-10-CM | POA: Diagnosis not present

## 2017-07-04 DIAGNOSIS — I252 Old myocardial infarction: Secondary | ICD-10-CM | POA: Diagnosis not present

## 2017-07-05 DIAGNOSIS — Z951 Presence of aortocoronary bypass graft: Secondary | ICD-10-CM | POA: Diagnosis not present

## 2017-07-05 DIAGNOSIS — K922 Gastrointestinal hemorrhage, unspecified: Secondary | ICD-10-CM | POA: Diagnosis not present

## 2017-07-05 DIAGNOSIS — I519 Heart disease, unspecified: Secondary | ICD-10-CM | POA: Diagnosis not present

## 2017-07-05 DIAGNOSIS — R079 Chest pain, unspecified: Secondary | ICD-10-CM | POA: Diagnosis not present

## 2017-07-05 DIAGNOSIS — I25110 Atherosclerotic heart disease of native coronary artery with unstable angina pectoris: Secondary | ICD-10-CM | POA: Diagnosis not present

## 2017-07-05 DIAGNOSIS — K2211 Ulcer of esophagus with bleeding: Secondary | ICD-10-CM | POA: Diagnosis not present

## 2017-07-05 DIAGNOSIS — I25118 Atherosclerotic heart disease of native coronary artery with other forms of angina pectoris: Secondary | ICD-10-CM | POA: Diagnosis not present

## 2017-07-05 DIAGNOSIS — I10 Essential (primary) hypertension: Secondary | ICD-10-CM | POA: Diagnosis not present

## 2017-07-05 DIAGNOSIS — I200 Unstable angina: Secondary | ICD-10-CM | POA: Diagnosis not present

## 2017-07-05 DIAGNOSIS — E785 Hyperlipidemia, unspecified: Secondary | ICD-10-CM | POA: Diagnosis not present

## 2017-07-05 DIAGNOSIS — R578 Other shock: Secondary | ICD-10-CM | POA: Diagnosis not present

## 2017-07-06 DIAGNOSIS — I200 Unstable angina: Secondary | ICD-10-CM | POA: Diagnosis not present

## 2017-07-06 DIAGNOSIS — Z951 Presence of aortocoronary bypass graft: Secondary | ICD-10-CM | POA: Diagnosis not present

## 2017-07-07 DIAGNOSIS — K5900 Constipation, unspecified: Secondary | ICD-10-CM | POA: Diagnosis not present

## 2017-07-07 DIAGNOSIS — I10 Essential (primary) hypertension: Secondary | ICD-10-CM | POA: Diagnosis not present

## 2017-07-07 DIAGNOSIS — E785 Hyperlipidemia, unspecified: Secondary | ICD-10-CM | POA: Diagnosis not present

## 2017-07-07 DIAGNOSIS — I2584 Coronary atherosclerosis due to calcified coronary lesion: Secondary | ICD-10-CM | POA: Diagnosis not present

## 2017-07-07 DIAGNOSIS — R0600 Dyspnea, unspecified: Secondary | ICD-10-CM | POA: Diagnosis not present

## 2017-07-07 DIAGNOSIS — K2970 Gastritis, unspecified, without bleeding: Secondary | ICD-10-CM | POA: Diagnosis not present

## 2017-07-07 DIAGNOSIS — K922 Gastrointestinal hemorrhage, unspecified: Secondary | ICD-10-CM | POA: Diagnosis not present

## 2017-07-07 DIAGNOSIS — D62 Acute posthemorrhagic anemia: Secondary | ICD-10-CM | POA: Diagnosis not present

## 2017-07-07 DIAGNOSIS — Z96643 Presence of artificial hip joint, bilateral: Secondary | ICD-10-CM | POA: Diagnosis not present

## 2017-07-07 DIAGNOSIS — K458 Other specified abdominal hernia without obstruction or gangrene: Secondary | ICD-10-CM | POA: Diagnosis not present

## 2017-07-07 DIAGNOSIS — I25110 Atherosclerotic heart disease of native coronary artery with unstable angina pectoris: Secondary | ICD-10-CM | POA: Diagnosis not present

## 2017-07-07 DIAGNOSIS — I2582 Chronic total occlusion of coronary artery: Secondary | ICD-10-CM | POA: Diagnosis not present

## 2017-07-07 DIAGNOSIS — I2510 Atherosclerotic heart disease of native coronary artery without angina pectoris: Secondary | ICD-10-CM | POA: Diagnosis not present

## 2017-07-07 DIAGNOSIS — K2211 Ulcer of esophagus with bleeding: Secondary | ICD-10-CM | POA: Diagnosis not present

## 2017-07-08 DIAGNOSIS — I517 Cardiomegaly: Secondary | ICD-10-CM | POA: Diagnosis not present

## 2017-07-08 DIAGNOSIS — K2970 Gastritis, unspecified, without bleeding: Secondary | ICD-10-CM | POA: Diagnosis not present

## 2017-07-08 DIAGNOSIS — E785 Hyperlipidemia, unspecified: Secondary | ICD-10-CM | POA: Diagnosis not present

## 2017-07-08 DIAGNOSIS — D62 Acute posthemorrhagic anemia: Secondary | ICD-10-CM | POA: Diagnosis not present

## 2017-07-08 DIAGNOSIS — I200 Unstable angina: Secondary | ICD-10-CM | POA: Diagnosis not present

## 2017-07-08 DIAGNOSIS — I2584 Coronary atherosclerosis due to calcified coronary lesion: Secondary | ICD-10-CM | POA: Diagnosis not present

## 2017-07-08 DIAGNOSIS — K922 Gastrointestinal hemorrhage, unspecified: Secondary | ICD-10-CM | POA: Diagnosis not present

## 2017-07-08 DIAGNOSIS — I2510 Atherosclerotic heart disease of native coronary artery without angina pectoris: Secondary | ICD-10-CM | POA: Diagnosis not present

## 2017-07-08 DIAGNOSIS — K2211 Ulcer of esophagus with bleeding: Secondary | ICD-10-CM | POA: Diagnosis not present

## 2017-07-08 DIAGNOSIS — I340 Nonrheumatic mitral (valve) insufficiency: Secondary | ICD-10-CM | POA: Diagnosis not present

## 2017-07-08 DIAGNOSIS — I10 Essential (primary) hypertension: Secondary | ICD-10-CM | POA: Diagnosis not present

## 2017-07-09 DIAGNOSIS — K922 Gastrointestinal hemorrhage, unspecified: Secondary | ICD-10-CM | POA: Diagnosis not present

## 2017-07-09 DIAGNOSIS — K2970 Gastritis, unspecified, without bleeding: Secondary | ICD-10-CM | POA: Diagnosis not present

## 2017-07-09 DIAGNOSIS — K2211 Ulcer of esophagus with bleeding: Secondary | ICD-10-CM | POA: Diagnosis not present

## 2017-07-09 DIAGNOSIS — D62 Acute posthemorrhagic anemia: Secondary | ICD-10-CM | POA: Diagnosis not present

## 2017-07-10 DIAGNOSIS — D62 Acute posthemorrhagic anemia: Secondary | ICD-10-CM | POA: Diagnosis not present

## 2017-07-10 DIAGNOSIS — I10 Essential (primary) hypertension: Secondary | ICD-10-CM | POA: Diagnosis not present

## 2017-07-10 DIAGNOSIS — I2510 Atherosclerotic heart disease of native coronary artery without angina pectoris: Secondary | ICD-10-CM | POA: Diagnosis not present

## 2017-07-10 DIAGNOSIS — K2970 Gastritis, unspecified, without bleeding: Secondary | ICD-10-CM | POA: Diagnosis not present

## 2017-07-10 DIAGNOSIS — K922 Gastrointestinal hemorrhage, unspecified: Secondary | ICD-10-CM | POA: Diagnosis not present

## 2017-07-10 DIAGNOSIS — E785 Hyperlipidemia, unspecified: Secondary | ICD-10-CM | POA: Diagnosis not present

## 2017-07-10 DIAGNOSIS — I2584 Coronary atherosclerosis due to calcified coronary lesion: Secondary | ICD-10-CM | POA: Diagnosis not present

## 2017-07-10 DIAGNOSIS — K2211 Ulcer of esophagus with bleeding: Secondary | ICD-10-CM | POA: Diagnosis not present

## 2017-07-11 DIAGNOSIS — I10 Essential (primary) hypertension: Secondary | ICD-10-CM | POA: Diagnosis not present

## 2017-07-11 DIAGNOSIS — D62 Acute posthemorrhagic anemia: Secondary | ICD-10-CM | POA: Diagnosis not present

## 2017-07-11 DIAGNOSIS — I2510 Atherosclerotic heart disease of native coronary artery without angina pectoris: Secondary | ICD-10-CM | POA: Diagnosis not present

## 2017-07-11 DIAGNOSIS — I200 Unstable angina: Secondary | ICD-10-CM | POA: Diagnosis not present

## 2017-07-11 DIAGNOSIS — E785 Hyperlipidemia, unspecified: Secondary | ICD-10-CM | POA: Diagnosis not present

## 2017-07-11 DIAGNOSIS — R578 Other shock: Secondary | ICD-10-CM | POA: Diagnosis not present

## 2017-07-11 DIAGNOSIS — K922 Gastrointestinal hemorrhage, unspecified: Secondary | ICD-10-CM | POA: Diagnosis not present

## 2017-07-11 DIAGNOSIS — I2584 Coronary atherosclerosis due to calcified coronary lesion: Secondary | ICD-10-CM | POA: Diagnosis not present

## 2017-07-12 DIAGNOSIS — K922 Gastrointestinal hemorrhage, unspecified: Secondary | ICD-10-CM | POA: Diagnosis not present

## 2017-07-12 DIAGNOSIS — I200 Unstable angina: Secondary | ICD-10-CM | POA: Diagnosis not present

## 2017-07-12 DIAGNOSIS — R578 Other shock: Secondary | ICD-10-CM | POA: Diagnosis not present

## 2017-07-12 DIAGNOSIS — D62 Acute posthemorrhagic anemia: Secondary | ICD-10-CM | POA: Diagnosis not present

## 2017-07-12 DIAGNOSIS — I2510 Atherosclerotic heart disease of native coronary artery without angina pectoris: Secondary | ICD-10-CM | POA: Diagnosis not present

## 2017-07-13 DIAGNOSIS — I200 Unstable angina: Secondary | ICD-10-CM | POA: Diagnosis not present

## 2017-07-13 DIAGNOSIS — D62 Acute posthemorrhagic anemia: Secondary | ICD-10-CM | POA: Diagnosis not present

## 2017-07-13 DIAGNOSIS — R578 Other shock: Secondary | ICD-10-CM | POA: Diagnosis not present

## 2017-07-13 DIAGNOSIS — K922 Gastrointestinal hemorrhage, unspecified: Secondary | ICD-10-CM | POA: Diagnosis not present

## 2017-07-16 DIAGNOSIS — R0602 Shortness of breath: Secondary | ICD-10-CM | POA: Diagnosis not present

## 2017-07-16 DIAGNOSIS — I200 Unstable angina: Secondary | ICD-10-CM | POA: Diagnosis not present

## 2017-07-16 DIAGNOSIS — I10 Essential (primary) hypertension: Secondary | ICD-10-CM | POA: Diagnosis not present

## 2017-07-16 DIAGNOSIS — I2510 Atherosclerotic heart disease of native coronary artery without angina pectoris: Secondary | ICD-10-CM | POA: Diagnosis not present

## 2017-07-24 DIAGNOSIS — I2510 Atherosclerotic heart disease of native coronary artery without angina pectoris: Secondary | ICD-10-CM | POA: Diagnosis not present

## 2017-07-24 DIAGNOSIS — I200 Unstable angina: Secondary | ICD-10-CM | POA: Diagnosis not present

## 2017-07-24 DIAGNOSIS — I10 Essential (primary) hypertension: Secondary | ICD-10-CM | POA: Diagnosis not present

## 2017-07-24 DIAGNOSIS — E785 Hyperlipidemia, unspecified: Secondary | ICD-10-CM | POA: Diagnosis not present

## 2017-07-25 DIAGNOSIS — K920 Hematemesis: Secondary | ICD-10-CM | POA: Diagnosis not present

## 2017-07-25 DIAGNOSIS — I2510 Atherosclerotic heart disease of native coronary artery without angina pectoris: Secondary | ICD-10-CM | POA: Diagnosis not present

## 2017-08-12 DIAGNOSIS — I200 Unstable angina: Secondary | ICD-10-CM | POA: Diagnosis not present

## 2017-08-12 DIAGNOSIS — K279 Peptic ulcer, site unspecified, unspecified as acute or chronic, without hemorrhage or perforation: Secondary | ICD-10-CM | POA: Diagnosis not present

## 2017-08-12 DIAGNOSIS — I10 Essential (primary) hypertension: Secondary | ICD-10-CM | POA: Diagnosis not present

## 2017-08-12 DIAGNOSIS — K920 Hematemesis: Secondary | ICD-10-CM | POA: Diagnosis not present

## 2017-10-07 HISTORY — PX: EYE SURGERY: SHX253

## 2017-10-13 DIAGNOSIS — I2510 Atherosclerotic heart disease of native coronary artery without angina pectoris: Secondary | ICD-10-CM | POA: Diagnosis not present

## 2017-10-13 DIAGNOSIS — E7800 Pure hypercholesterolemia, unspecified: Secondary | ICD-10-CM | POA: Diagnosis not present

## 2017-10-13 DIAGNOSIS — I10 Essential (primary) hypertension: Secondary | ICD-10-CM | POA: Diagnosis not present

## 2017-10-13 DIAGNOSIS — R0602 Shortness of breath: Secondary | ICD-10-CM | POA: Diagnosis not present

## 2017-10-15 ENCOUNTER — Encounter: Payer: Self-pay | Admitting: Family Medicine

## 2017-10-15 ENCOUNTER — Ambulatory Visit: Payer: Medicare Other | Admitting: Family Medicine

## 2017-10-15 VITALS — BP 113/68 | HR 72 | Temp 97.7°F | Resp 16 | Wt 173.8 lb

## 2017-10-15 DIAGNOSIS — M795 Residual foreign body in soft tissue: Secondary | ICD-10-CM

## 2017-10-15 NOTE — Patient Instructions (Addendum)
Try some donut pads to offload the area. This should resolve over time.  Things to look out for: increasing pain not relieved by ibuprofen/acetaminophen, fevers, spreading redness, drainage of pus, or foul odor.  Let us know if you need anything.

## 2017-10-15 NOTE — Progress Notes (Signed)
CC- R foot pain  Tyler Ortega is a 77 y.o. male here for a skin complaint.  Duration: 3 weeks Location: Bottom of R foot Pruritic? No Painful? Yes Drainage? No New soaps/lotions/topicals/detergents? No Sick contacts? No Other associated symptoms: Had redness that resolved Therapies tried thus far: none  ROS:  Const: No fevers Skin: As noted in HPI  Past Medical History:  Diagnosis Date  . Arthritis    osteoarthritis -hips, back pain tx with Prednisone-last dose 09-04-15.  Marland Kitchen CAD (coronary artery disease)    MI 2011, seen @ [name], sees Public librarian (once a year)  . Hyperlipidemia   . Hypertension   . Skin cancer    R calf and the ear (?side)  . Transfusion history    2nd Hip surgery    BP 113/68 (BP Location: Left Arm, Patient Position: Sitting, Cuff Size: Small)   Pulse 72   Temp 97.7 F (36.5 C) (Oral)   Resp 16   Wt 173 lb 12.8 oz (78.8 kg)   SpO2 99%   BMI 26.43 kg/m  Gen: awake, alert, appearing stated age Lungs: No accessory muscle use Skin: see below. +TTP. No excessive warmth. No drainage, erythema, fluctuance, excoriation Psych: Age appropriate judgment and insight  Media Information     Foreign body (FB) in soft tissue  Given hx, sounds more like fb rather than a plantar wart. I think offloading area with padding and letting his body slowly expel. If no improvement or not pleased w progress, will excise. Could also trial tx for warts vs referring to podiatry. F/u prn. The patient voiced understanding and agreement to the plan.  [place], DO 10/15/17 3:58 PM

## 2017-10-16 DIAGNOSIS — I2510 Atherosclerotic heart disease of native coronary artery without angina pectoris: Secondary | ICD-10-CM | POA: Diagnosis not present

## 2017-11-03 ENCOUNTER — Encounter: Payer: Self-pay | Admitting: Internal Medicine

## 2017-11-03 ENCOUNTER — Ambulatory Visit: Payer: Medicare Other | Admitting: Internal Medicine

## 2017-11-03 VITALS — BP 108/64 | HR 62 | Temp 97.4°F | Resp 14 | Ht 68.0 in | Wt 172.5 lb

## 2017-11-03 DIAGNOSIS — L84 Corns and callosities: Secondary | ICD-10-CM | POA: Diagnosis not present

## 2017-11-03 NOTE — Patient Instructions (Signed)
Will refer you to the podiatrist

## 2017-11-03 NOTE — Progress Notes (Signed)
Pre visit review using our clinic review tool, if applicable. No additional management support is needed unless otherwise documented below in the visit note. 

## 2017-11-03 NOTE — Progress Notes (Signed)
Subjective:    Patient ID: Tyler Ortega, male    DOB: [birth year], 77 y.o.   MRN: [id number]  DOS:  11/03/2017 Type of visit - description : acute Interval history: 5-week history of pain at the right foot; pain is located @ the plantar area, distally;  only when he stands up. The area was checked by one of my partners, he is using a pad to off load pressure  but is no better.  Review of Systems Denies any injury, bleeding.  Past Medical History:  Diagnosis Date  . Arthritis    osteoarthritis -hips, back pain tx with Prednisone-last dose 09-04-15.  Marland Kitchen CAD (coronary artery disease)    MI 2011, seen @ [name], sees Public librarian (once a year)  . Hyperlipidemia   . Hypertension   . Skin cancer    R calf and the ear (?side)  . Transfusion history    2nd Hip surgery    Past Surgical History:  Procedure Laterality Date  . COLONOSCOPY  05/29/11   Normal  . CORONARY ARTERY BYPASS GRAFT     05-2010-x4 vessel -[place]     x3 - L hip (initial hip replacement bleed)  . INGUINAL HERNIA REPAIR Left   . TOTAL HIP ARTHROPLASTY Right 09/12/2015   Procedure: RIGHT TOTAL HIP ARTHROPLASTY ANTERIOR APPROACH;  Surgeon: Paralee Cancel, MD;  Location: [facility];  Service: Orthopedics;  Laterality: Right;  Marland Kitchen VASECTOMY      Social History   Socioeconomic History  . Marital status: Married    Spouse name: [name]  . Number of children: 3  . Years of education: Some colle  . Highest education level: Not on file  Social Needs  . Financial resource strain: Not on file  . Food insecurity - worry: Not on file  . Food insecurity - inability: Not on file  . Transportation needs - medical: Not on file  . Transportation needs - non-medical: Not on file  Occupational History  . Occupation: Armed forces operational officer, Retired    Fish farm manager: OTHER  Tobacco Use  . Smoking status: Never Smoker  . Smokeless tobacco: Never Used  Substance and Sexual Activity  . Alcohol use: Yes    Comment:  rarely   . Drug use: No  . Sexual activity: Not on file  Other Topics Concern  . Not on file  Social History Narrative   1 Daughter lives in [place], lives w/ wife    Caffeine use: 1-2 cups coffee per day   Soda: rare      Allergies as of 11/03/2017   No Known Allergies     Medication List        Accurate as of 11/03/17 11:54 AM. Always use your most recent med list.          aspirin EC 81 MG tablet Take 81 mg by mouth daily.   atorvastatin 80 MG tablet Commonly known as:  LIPITOR Take 80 mg by mouth daily.   ticagrelor 90 MG Tabs tablet Commonly known as:  BRILINTA Take by mouth 2 (two) times daily.          Objective:   Physical Exam BP 108/64 (BP Location: Left Arm, Patient Position: Sitting, Cuff Size: Small)   Pulse 62   Temp (!) 97.4 F (36.3 C) (Oral)   Resp 14   Ht 5\' 8"  (1.727 m)   Wt 172 lb 8 oz (78.2 kg)   SpO2 98%   BMI 26.23 kg/m  General:   Well developed, well nourished . NAD.  HEENT:  Normocephalic . Face symmetric, atraumatic MSK: Right foot: Normal except for callus or wart distally by the third to fourth toe in the plantar area.  That seems to be growing inwards.  No redness, swelling. Skin: Not pale. Not jaundice Neurologic:  alert & oriented X3.  Speech normal, gait appropriate for age and unassisted Psych--  Cognition and judgment appear intact.  Cooperative with normal attention span and concentration.  Behavior appropriate. No anxious or depressed appearing.      Assessment & Plan:    Assessment Hyperglycemia: A1c 6.45/2017 HTN Hyperlipidemia CAD: MI and CABG 2011, cardiology Dr Prince Rome Transient amnesia: saw neuro. 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 (likely chronic, unrelated finding per neurology); EEG (-) B12 def dx 02-2106 ABX pre dental work request by ortho d/t hip surgeries    PLAN: Plantar callus  or wart.  Refer to podiatry, hopefully they will be able to treat or destroyed the area. If  not improving after that consider further eval. Follow-up as already scheduled with me in few weeks.

## 2017-11-04 NOTE — Assessment & Plan Note (Signed)
Plantar callus or wart.  Refer to podiatry, hopefully they will be able to treat or destroyed the area. If  not improving after that consider further eval. Follow-up as already scheduled with me in few weeks.

## 2017-11-12 DIAGNOSIS — M79671 Pain in right foot: Secondary | ICD-10-CM | POA: Diagnosis not present

## 2017-11-12 DIAGNOSIS — D2371 Other benign neoplasm of skin of right lower limb, including hip: Secondary | ICD-10-CM | POA: Diagnosis not present

## 2017-11-26 DIAGNOSIS — M79671 Pain in right foot: Secondary | ICD-10-CM | POA: Diagnosis not present

## 2017-11-26 DIAGNOSIS — D2371 Other benign neoplasm of skin of right lower limb, including hip: Secondary | ICD-10-CM | POA: Diagnosis not present

## 2017-12-01 ENCOUNTER — Encounter: Payer: Self-pay | Admitting: Internal Medicine

## 2017-12-01 ENCOUNTER — Ambulatory Visit: Payer: Medicare Other | Admitting: Internal Medicine

## 2017-12-01 VITALS — BP 118/68 | HR 62 | Temp 98.2°F | Resp 14 | Ht 68.0 in | Wt 174.0 lb

## 2017-12-01 DIAGNOSIS — R05 Cough: Secondary | ICD-10-CM | POA: Diagnosis not present

## 2017-12-01 DIAGNOSIS — E785 Hyperlipidemia, unspecified: Secondary | ICD-10-CM

## 2017-12-01 DIAGNOSIS — I25810 Atherosclerosis of coronary artery bypass graft(s) without angina pectoris: Secondary | ICD-10-CM

## 2017-12-01 DIAGNOSIS — R059 Cough, unspecified: Secondary | ICD-10-CM

## 2017-12-01 MED ORDER — AZELASTINE HCL 0.1 % NA SOLN: 2.0000 | Freq: Every evening | NASAL | 3 refills | Status: DC | PRN

## 2017-12-01 NOTE — Patient Instructions (Signed)
GO TO THE FRONT DESK Schedule your next appointment for a  Physical in 4 to 6 months, fasting  For cough Use OTC  Flonase 2 sprays on each side in the mornings Use ASTELIN a prescribed spray : 2 nasal sprays on each side of the nose at night until you feel better

## 2017-12-01 NOTE — Progress Notes (Signed)
Pre visit review using our clinic review tool, if applicable. No additional management support is needed unless otherwise documented below in the visit note. 

## 2017-12-01 NOTE — Progress Notes (Signed)
Subjective:    Patient ID: Tyler Ortega, male    DOB: September 14, 1941, 77 y.o.   MRN: [id number]  DOS:  12/01/2017 Type of visit - description : ROV Interval history: No major concerns. He feels well. Remains very active, saw cardiology, patient reports a negative stress test last month. Complaint of cough, dry, mild, going on for "a while".  Patient reports that he can barely notice the cough but his wife is concerned.   Review of Systems Denies fever, chills. No weight loss No chest pain, shortness of breath or wheezing. No GERD symptoms He reports he does have occasional runny nose, mostly on the left nostril.  Past Medical History:  Diagnosis Date  . Arthritis    osteoarthritis -hips, back pain tx with Prednisone-last dose 09-04-15.  Marland Kitchen CAD (coronary artery disease)    MI 2011, seen @ [name], sees Public librarian (once a year)  . Hyperlipidemia   . Hypertension   . Skin cancer    R calf and the ear (?side)  . Transfusion history    2nd Hip surgery    Past Surgical History:  Procedure Laterality Date  . COLONOSCOPY  05/29/11   Normal  . CORONARY ARTERY BYPASS GRAFT     05-2010-x4 vessel -[place]     x3 - L hip (initial hip replacement bleed)  . INGUINAL HERNIA REPAIR Left   . TOTAL HIP ARTHROPLASTY Right 09/12/2015   Procedure: RIGHT TOTAL HIP ARTHROPLASTY ANTERIOR APPROACH;  Surgeon: Paralee Cancel, MD;  Location: [facility];  Service: Orthopedics;  Laterality: Right;  Marland Kitchen VASECTOMY      Social History   Socioeconomic History  . Marital status: Married    Spouse name: [name]  . Number of children: 3  . Years of education: Some colle  . Highest education level: Not on file  Social Needs  . Financial resource strain: Not on file  . Food insecurity - worry: Not on file  . Food insecurity - inability: Not on file  . Transportation needs - medical: Not on file  . Transportation needs - non-medical: Not on file  Occupational History  .  Occupation: Armed forces operational officer, Retired    Fish farm manager: OTHER  Tobacco Use  . Smoking status: Never Smoker  . Smokeless tobacco: Never Used  Substance and Sexual Activity  . Alcohol use: Yes    Comment: rarely   . Drug use: No  . Sexual activity: Not on file  Other Topics Concern  . Not on file  Social History Narrative   1 Daughter lives in [place], lives w/ wife    Caffeine use: 1-2 cups coffee per day   Soda: rare      Allergies as of 12/01/2017   No Known Allergies     Medication List        Accurate as of 12/01/17  5:02 PM. Always use your most recent med list.          aspirin EC 81 MG tablet Take 81 mg by mouth daily.   atorvastatin 80 MG tablet Commonly known as:  LIPITOR Take 80 mg by mouth daily.   azelastine 0.1 % nasal spray Commonly known as:  ASTELIN Place 2 sprays into both nostrils at bedtime as needed for rhinitis. Use in each nostril as directed   ticagrelor 90 MG Tabs tablet Commonly known as:  BRILINTA Take by mouth 2 (two) times daily.          Objective:  Physical Exam BP 118/68 (BP Location: Left Arm, Patient Position: Sitting, Cuff Size: Small)   Pulse 62   Temp 98.2 F (36.8 C) (Oral)   Resp 14   Ht 5\' 8"  (1.727 m)   Wt 174 lb (78.9 kg)   SpO2 98%   BMI 26.46 kg/m  General:   Well developed, well nourished . NAD.  HEENT:  Normocephalic . Face symmetric, atraumatic throat symmetric, nose not congested, sinuses no TTP Lungs:  CTA B Normal respiratory effort, no intercostal retractions, no accessory muscle use. Heart: RRR,  no murmur.  No pretibial edema bilaterally  Skin: Not pale. Not jaundice Neurologic:  alert & oriented X3.  Speech normal, gait appropriate for age and unassisted Psych--  Cognition and judgment appear intact.  Cooperative with normal attention span and concentration.  Behavior appropriate. No anxious or depressed appearing.      Assessment & Plan:    Assessment Hyperglycemia: A1c  6.45/2017 HTN Hyperlipidemia CAD: MI and CABG 2011, cardiology Dr Prince Rome Transient amnesia: saw neuro. 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 (likely chronic, unrelated finding per neurology); EEG (-) B12 def dx 02-2106 ABX pre dental work request by ortho d/t hip surgeries    PLAN: CAD: Asx, continue same meds, patient reports Cardiolite done last month was (-). Hyperlipidemia: Controlled on Lipitor Cough: As described above, no red flag symptoms, he does have some nasal congestion.  Rec Flonase and Astelin, call if not improving.  Chest x-ray?  Further workup?.  Trial with PPIs?. H/o Plantar wart: see last OV. Treated elsewhere RTC 4-6 months fasting CPX

## 2017-12-01 NOTE — Assessment & Plan Note (Signed)
CAD: Asx, continue same meds, patient reports Cardiolite done last month was (-). Hyperlipidemia: Controlled on Lipitor Cough: As described above, no red flag symptoms, he does have some nasal congestion.  Rec Flonase and Astelin, call if not improving.  Chest x-ray?  Further workup?.  Trial with PPIs?. H/o Plantar wart: see last OV. Treated elsewhere RTC 4-6 months fasting CPX

## 2017-12-11 DIAGNOSIS — D2371 Other benign neoplasm of skin of right lower limb, including hip: Secondary | ICD-10-CM | POA: Diagnosis not present

## 2017-12-11 DIAGNOSIS — M79671 Pain in right foot: Secondary | ICD-10-CM | POA: Diagnosis not present

## 2017-12-25 DIAGNOSIS — M79671 Pain in right foot: Secondary | ICD-10-CM | POA: Diagnosis not present

## 2017-12-25 DIAGNOSIS — D2371 Other benign neoplasm of skin of right lower limb, including hip: Secondary | ICD-10-CM | POA: Diagnosis not present

## 2017-12-30 DIAGNOSIS — H25012 Cortical age-related cataract, left eye: Secondary | ICD-10-CM | POA: Diagnosis not present

## 2017-12-30 DIAGNOSIS — H2513 Age-related nuclear cataract, bilateral: Secondary | ICD-10-CM | POA: Diagnosis not present

## 2017-12-30 DIAGNOSIS — H25042 Posterior subcapsular polar age-related cataract, left eye: Secondary | ICD-10-CM | POA: Diagnosis not present

## 2017-12-30 DIAGNOSIS — H2512 Age-related nuclear cataract, left eye: Secondary | ICD-10-CM | POA: Diagnosis not present

## 2017-12-30 DIAGNOSIS — H04123 Dry eye syndrome of bilateral lacrimal glands: Secondary | ICD-10-CM | POA: Insufficient documentation

## 2018-01-01 DIAGNOSIS — M79671 Pain in right foot: Secondary | ICD-10-CM | POA: Diagnosis not present

## 2018-01-01 DIAGNOSIS — D2371 Other benign neoplasm of skin of right lower limb, including hip: Secondary | ICD-10-CM | POA: Diagnosis not present

## 2018-01-19 DIAGNOSIS — I25810 Atherosclerosis of coronary artery bypass graft(s) without angina pectoris: Secondary | ICD-10-CM | POA: Diagnosis not present

## 2018-01-19 DIAGNOSIS — Z79899 Other long term (current) drug therapy: Secondary | ICD-10-CM | POA: Diagnosis not present

## 2018-01-19 DIAGNOSIS — H25012 Cortical age-related cataract, left eye: Secondary | ICD-10-CM | POA: Diagnosis not present

## 2018-01-19 DIAGNOSIS — I10 Essential (primary) hypertension: Secondary | ICD-10-CM | POA: Diagnosis not present

## 2018-01-19 DIAGNOSIS — I252 Old myocardial infarction: Secondary | ICD-10-CM | POA: Diagnosis not present

## 2018-01-19 DIAGNOSIS — H40013 Open angle with borderline findings, low risk, bilateral: Secondary | ICD-10-CM | POA: Diagnosis not present

## 2018-01-19 DIAGNOSIS — E785 Hyperlipidemia, unspecified: Secondary | ICD-10-CM | POA: Diagnosis not present

## 2018-01-19 DIAGNOSIS — Z7982 Long term (current) use of aspirin: Secondary | ICD-10-CM | POA: Diagnosis not present

## 2018-01-19 DIAGNOSIS — H25042 Posterior subcapsular polar age-related cataract, left eye: Secondary | ICD-10-CM | POA: Diagnosis not present

## 2018-01-19 DIAGNOSIS — H2512 Age-related nuclear cataract, left eye: Secondary | ICD-10-CM | POA: Diagnosis not present

## 2018-01-19 DIAGNOSIS — H04123 Dry eye syndrome of bilateral lacrimal glands: Secondary | ICD-10-CM | POA: Diagnosis not present

## 2018-02-03 DIAGNOSIS — B070 Plantar wart: Secondary | ICD-10-CM | POA: Diagnosis not present

## 2018-02-03 DIAGNOSIS — R208 Other disturbances of skin sensation: Secondary | ICD-10-CM | POA: Diagnosis not present

## 2018-02-04 DIAGNOSIS — I2510 Atherosclerotic heart disease of native coronary artery without angina pectoris: Secondary | ICD-10-CM | POA: Diagnosis not present

## 2018-02-04 DIAGNOSIS — Z7982 Long term (current) use of aspirin: Secondary | ICD-10-CM | POA: Diagnosis not present

## 2018-02-04 DIAGNOSIS — H2513 Age-related nuclear cataract, bilateral: Secondary | ICD-10-CM | POA: Diagnosis not present

## 2018-02-04 DIAGNOSIS — H04123 Dry eye syndrome of bilateral lacrimal glands: Secondary | ICD-10-CM | POA: Diagnosis not present

## 2018-02-04 DIAGNOSIS — E785 Hyperlipidemia, unspecified: Secondary | ICD-10-CM | POA: Diagnosis not present

## 2018-02-04 DIAGNOSIS — Z951 Presence of aortocoronary bypass graft: Secondary | ICD-10-CM | POA: Diagnosis not present

## 2018-02-04 DIAGNOSIS — I10 Essential (primary) hypertension: Secondary | ICD-10-CM | POA: Diagnosis not present

## 2018-02-04 DIAGNOSIS — I252 Old myocardial infarction: Secondary | ICD-10-CM | POA: Diagnosis not present

## 2018-02-04 DIAGNOSIS — H2511 Age-related nuclear cataract, right eye: Secondary | ICD-10-CM | POA: Diagnosis not present

## 2018-02-04 DIAGNOSIS — Z79899 Other long term (current) drug therapy: Secondary | ICD-10-CM | POA: Diagnosis not present

## 2018-02-04 DIAGNOSIS — H25011 Cortical age-related cataract, right eye: Secondary | ICD-10-CM | POA: Diagnosis not present

## 2018-02-04 DIAGNOSIS — Z961 Presence of intraocular lens: Secondary | ICD-10-CM | POA: Diagnosis not present

## 2018-02-04 DIAGNOSIS — H40013 Open angle with borderline findings, low risk, bilateral: Secondary | ICD-10-CM | POA: Diagnosis not present

## 2018-02-20 DIAGNOSIS — R69 Illness, unspecified: Secondary | ICD-10-CM | POA: Diagnosis not present

## 2018-03-04 ENCOUNTER — Telehealth: Payer: Self-pay | Admitting: Internal Medicine

## 2018-03-04 NOTE — Telephone Encounter (Signed)
Copied from [place] [id number]. Topic: Quick Communication - See Telephone Encounter >> Mar 04, 2018  9:39 AM Neva Seat wrote: [facility] [phone]  Needing pt immunization records.

## 2018-03-04 NOTE — Telephone Encounter (Signed)
Immunization record faxed to [facility].

## 2018-03-05 DIAGNOSIS — R208 Other disturbances of skin sensation: Secondary | ICD-10-CM | POA: Diagnosis not present

## 2018-03-05 DIAGNOSIS — B070 Plantar wart: Secondary | ICD-10-CM | POA: Diagnosis not present

## 2018-03-18 ENCOUNTER — Encounter: Payer: Self-pay | Admitting: Internal Medicine

## 2018-03-18 DIAGNOSIS — I2510 Atherosclerotic heart disease of native coronary artery without angina pectoris: Secondary | ICD-10-CM

## 2018-03-18 NOTE — Telephone Encounter (Signed)
Carotid ultrasound ordered.

## 2018-03-31 ENCOUNTER — Other Ambulatory Visit: Payer: Self-pay | Admitting: Internal Medicine

## 2018-03-31 DIAGNOSIS — I6529 Occlusion and stenosis of unspecified carotid artery: Secondary | ICD-10-CM

## 2018-04-03 ENCOUNTER — Ambulatory Visit (HOSPITAL_COMMUNITY)
Admission: RE | Admit: 2018-04-03 | Discharge: 2018-04-03 | Disposition: A | Discharge status: Home / Self Care | Payer: Medicare Other | Source: Ambulatory Visit | Attending: Cardiovascular Disease | Admitting: Cardiovascular Disease

## 2018-04-03 DIAGNOSIS — E785 Hyperlipidemia, unspecified: Secondary | ICD-10-CM | POA: Insufficient documentation

## 2018-04-03 DIAGNOSIS — I6529 Occlusion and stenosis of unspecified carotid artery: Secondary | ICD-10-CM | POA: Diagnosis not present

## 2018-04-03 DIAGNOSIS — I2510 Atherosclerotic heart disease of native coronary artery without angina pectoris: Secondary | ICD-10-CM | POA: Diagnosis not present

## 2018-04-03 DIAGNOSIS — I10 Essential (primary) hypertension: Secondary | ICD-10-CM | POA: Insufficient documentation

## 2018-04-03 DIAGNOSIS — I6521 Occlusion and stenosis of right carotid artery: Secondary | ICD-10-CM | POA: Insufficient documentation

## 2018-04-06 DIAGNOSIS — R208 Other disturbances of skin sensation: Secondary | ICD-10-CM | POA: Diagnosis not present

## 2018-04-06 DIAGNOSIS — L570 Actinic keratosis: Secondary | ICD-10-CM | POA: Diagnosis not present

## 2018-04-06 DIAGNOSIS — L218 Other seborrheic dermatitis: Secondary | ICD-10-CM | POA: Diagnosis not present

## 2018-04-06 DIAGNOSIS — B078 Other viral warts: Secondary | ICD-10-CM | POA: Diagnosis not present

## 2018-04-07 ENCOUNTER — Encounter: Payer: Self-pay | Admitting: Internal Medicine

## 2018-04-07 ENCOUNTER — Encounter: Payer: Medicare Other | Admitting: Internal Medicine

## 2018-04-07 ENCOUNTER — Ambulatory Visit (INDEPENDENT_AMBULATORY_CARE_PROVIDER_SITE_OTHER): Payer: Medicare Other | Admitting: Internal Medicine

## 2018-04-07 VITALS — BP 128/78 | HR 59 | Temp 97.8°F | Resp 16 | Ht 68.0 in | Wt 171.0 lb

## 2018-04-07 DIAGNOSIS — Z0000 Encounter for general adult medical examination without abnormal findings: Secondary | ICD-10-CM

## 2018-04-07 DIAGNOSIS — E538 Deficiency of other specified B group vitamins: Secondary | ICD-10-CM | POA: Diagnosis not present

## 2018-04-07 DIAGNOSIS — D509 Iron deficiency anemia, unspecified: Secondary | ICD-10-CM

## 2018-04-07 DIAGNOSIS — E785 Hyperlipidemia, unspecified: Secondary | ICD-10-CM | POA: Diagnosis not present

## 2018-04-07 DIAGNOSIS — R739 Hyperglycemia, unspecified: Secondary | ICD-10-CM | POA: Diagnosis not present

## 2018-04-07 LAB — LIPID PANEL
Cholesterol: 118 mg/dL (ref 0–200)
HDL: 56.9 mg/dL (ref >39.00)
LDL Cholesterol: 51 mg/dL (ref 0–99)
NonHDL: 60.62
Total CHOL/HDL Ratio: 2
Triglycerides: 46 mg/dL (ref 0.0–149.0)
VLDL: 9.2 mg/dL (ref 0.0–40.0)

## 2018-04-07 LAB — CBC WITH DIFFERENTIAL/PLATELET
Basophils Absolute: 0.1 10*3/uL (ref 0.0–0.1)
Basophils Relative: 1 % (ref 0.0–3.0)
Eosinophils Absolute: 0.3 10*3/uL (ref 0.0–0.7)
Eosinophils Relative: 5.7 % — ABNORMAL HIGH (ref 0.0–5.0)
HCT: 43.5 % (ref 39.0–52.0)
Hemoglobin: 14.6 g/dL (ref 13.0–17.0)
Lymphocytes Relative: 19.4 % (ref 12.0–46.0)
Lymphs Abs: 1 10*3/uL (ref 0.7–4.0)
MCHC: 33.5 g/dL (ref 30.0–36.0)
MCV: 100.4 fl — ABNORMAL HIGH (ref 78.0–100.0)
Monocytes Absolute: 0.4 10*3/uL (ref 0.1–1.0)
Monocytes Relative: 8.2 % (ref 3.0–12.0)
Neutro Abs: 3.4 10*3/uL (ref 1.4–7.7)
Neutrophils Relative %: 65.7 % (ref 43.0–77.0)
Platelets: 175 10*3/uL (ref 150.0–400.0)
RBC: 4.33 Mil/uL (ref 4.22–5.81)
RDW: 14.1 % (ref 11.5–15.5)
WBC: 5.2 10*3/uL (ref 4.0–10.5)

## 2018-04-07 LAB — BASIC METABOLIC PANEL
BUN: 19 mg/dL (ref 6–23)
CO2: 29 mEq/L (ref 19–32)
Calcium: 8.9 mg/dL (ref 8.4–10.5)
Chloride: 107 mEq/L (ref 96–112)
Creatinine, Ser: 1.14 mg/dL (ref 0.40–1.50)
GFR: 66.36 mL/min (ref >60.00)
Glucose, Bld: 100 mg/dL — ABNORMAL HIGH (ref 70–99)
Potassium: 4.4 mEq/L (ref 3.5–5.1)
Sodium: 139 mEq/L (ref 135–145)

## 2018-04-07 LAB — VITAMIN B12: Vitamin B-12: 1112 pg/mL — ABNORMAL HIGH (ref 211–911)

## 2018-04-07 LAB — HEMOGLOBIN A1C: Hgb A1c MFr Bld: 6.1 % (ref 4.6–6.5)

## 2018-04-07 LAB — ALT: ALT: 16 U/L (ref 0–53)

## 2018-04-07 LAB — AST: AST: 21 U/L (ref 0–37)

## 2018-04-07 LAB — FERRITIN: Ferritin: 51.7 ng/mL (ref 22.0–322.0)

## 2018-04-07 LAB — IRON: Iron: 109 ug/dL (ref 42–165)

## 2018-04-07 NOTE — Assessment & Plan Note (Addendum)
-  Td 2012; Pneumonia shot: 2015; prevnar: 2016.  shingrix discussed, due to cost issues recommend to get it at the pharmacy --CCS:  cscope normal 2012, next 10 years  --Cancer screening: DRE normal today. Check a PSA  --Labs: FLP, BMP, LFTs, CBC, A1c, iron, ferritin --Cont healthy life style

## 2018-04-07 NOTE — Progress Notes (Signed)
Pre visit review using our clinic review tool, if applicable. No additional management support is needed unless otherwise documented below in the visit note. 

## 2018-04-07 NOTE — Progress Notes (Signed)
Subjective:    Patient ID: Tyler Ortega, male    DOB: 1941/05/20, 77 y.o.   MRN: [id number]  DOS:  04/07/2018    type of visit - description : Physical exam Interval history: Feeling well, no concerns. Good compliance with medication.   Review of Systems Had cataract surgery recently, very good results.  Other than above, a 14 point review of systems is negative    Past Medical History:  Diagnosis Date  . Arthritis    osteoarthritis -hips, back pain tx with Prednisone-last dose 09-04-15.  Marland Kitchen CAD (coronary artery disease)    MI 2011, seen @ [name], sees Public librarian (once a year)  . Hyperlipidemia   . Hypertension   . Skin cancer    R calf and the ear (?side)  . Transfusion history    2nd Hip surgery    Past Surgical History:  Procedure Laterality Date  . COLONOSCOPY  05/29/11   Normal  . CORONARY ARTERY BYPASS GRAFT     05-2010-x4 vessel -[place] Bilateral 2019   cataract  . HIP SURGERY     x3 - L hip (initial hip replacement bleed)  . INGUINAL HERNIA REPAIR Left   . TOTAL HIP ARTHROPLASTY Right 09/12/2015   Procedure: RIGHT TOTAL HIP ARTHROPLASTY ANTERIOR APPROACH;  Surgeon: Paralee Cancel, MD;  Location: [facility];  Service: Orthopedics;  Laterality: Right;  Marland Kitchen VASECTOMY      Social History   Socioeconomic History  . Marital status: Married    Spouse name: [name]  . Number of children: 3  . Years of education: Some colle  . Highest education level: Not on file  Occupational History  . Occupation: Armed forces operational officer, Retired    Fish farm manager: OTHER  Social Needs  . Financial resource strain: Not on file  . Food insecurity:    Worry: Not on file    Inability: Not on file  . Transportation needs:    Medical: Not on file    Non-medical: Not on file  Tobacco Use  . Smoking status: Never Smoker  . Smokeless tobacco: Never Used  Substance and Sexual Activity  . Alcohol use: Yes    Comment: rarely   . Drug use: No  . Sexual activity: Not  on file  Lifestyle  . Physical activity:    Days per week: Not on file    Minutes per session: Not on file  . Stress: Not on file  Relationships  . Social connections:    Talks on phone: Not on file    Gets together: Not on file    Attends religious service: Not on file    Active member of club or organization: Not on file    Attends meetings of clubs or organizations: Not on file    Relationship status: Not on file  . Intimate partner violence:    Fear of current or ex partner: Not on file    Emotionally abused: Not on file    Physically abused: Not on file    Forced sexual activity: Not on file  Other Topics Concern  . Not on file  Social History Narrative   1 Daughter lives in [place], lives w/ wife    Caffeine use: 1-2 cups coffee per day   Soda: rare     Family History  Problem Relation Age of Onset  . Coronary artery disease Neg Hx   . Diabetes Neg Hx   . Colon cancer Neg Hx   .  Prostate cancer Neg Hx   . Seizures Neg Hx   . Multiple sclerosis Neg Hx   . Migraines Neg Hx   . Dementia Neg Hx      Allergies as of 04/07/2018   No Known Allergies     Medication List        Accurate as of 04/07/18 11:59 PM. Always use your most recent med list.          aspirin EC 81 MG tablet Take 81 mg by mouth daily.   atorvastatin 80 MG tablet Commonly known as:  LIPITOR Take 80 mg by mouth daily.   azelastine 0.1 % nasal spray Commonly known as:  ASTELIN Place 2 sprays into both nostrils at bedtime as needed for rhinitis. Use in each nostril as directed   ticagrelor 90 MG Tabs tablet Commonly known as:  BRILINTA Take by mouth 2 (two) times daily.          Objective:   Physical Exam BP 128/78 (BP Location: Left Arm, Patient Position: Sitting, Cuff Size: Small)   Pulse (!) 59   Temp 97.8 F (36.6 C) (Oral)   Resp 16   Ht 5\' 8"  (1.727 m)   Wt 171 lb (77.6 kg)   SpO2 98%   BMI 26.00 kg/m  General: Well developed, NAD, see BMI.  HEENT:  Normocephalic .  Face symmetric, atraumatic Lungs:  CTA B Normal respiratory effort, no intercostal retractions, no accessory muscle use. Heart: RRR,  no murmur.  No pretibial edema bilaterally  Abdomen:  Not distended, soft, non-tender. No rebound or rigidity.   Skin: Exposed areas without rash. Not pale. Not jaundice Rectal: External abnormalities: none. Normal sphincter tone. No rectal masses or tenderness.  Brown stools Prostate: Prostate gland firm and smooth, no enlargement, nodularity, tenderness, mass, asymmetry or induration Neurologic:  alert & oriented X3.  Speech normal, gait appropriate for age and unassisted Strength symmetric and appropriate for age.  Psych: Cognition and judgment appear intact.  Cooperative with normal attention span and concentration.  Behavior appropriate. No anxious or depressed appearing.     Assessment & Plan:   Assessment Hyperglycemia: A1c 6.45/2017 HTN Hyperlipidemia CAD: Dr. Prince Rome --MI and CABG 2011 --DOE> admitted>  cath 07-2017, had a stent GI bleeding: Hg dropped to 4.8 after cath 07/2017, + hematemesis  EGD EG junction ulceration, status post endoscopy treatment (clipped) Transient amnesia: saw neuro. 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 (likely chronic, unrelated finding per neurology); EEG (-) B12 def dx 02-2106 ABX pre dental work request by ortho d/t hip surgeries    PLAN: Hyperglycemia: Check A1c HTN: On no medications, BP normal Hyperlipidemia: On Lipitor, checking labs CAD: Asymptomatic, on DAPT, had a stent 07-2017 needs to see Dr. Prince Rome referral. GI bleed: see assessment. Checking labs, asymptomatic B12 deficiency, h/o: labs RTC 6 months

## 2018-04-07 NOTE — Patient Instructions (Signed)
GO TO THE LAB : Get the blood work     GO TO THE FRONT DESK Schedule your next appointment for a checkup in 6 months   Continue the same medications, please see Dr. Prince Rome within the next couple months

## 2018-04-08 NOTE — Assessment & Plan Note (Signed)
Hyperglycemia: Check A1c HTN: On no medications, BP normal Hyperlipidemia: On Lipitor, checking labs CAD: Asymptomatic, on DAPT, had a stent 07-2017 needs to see Dr. Prince Rome referral. GI bleed: see assessment. Checking labs, asymptomatic B12 deficiency, h/o: labs RTC 6 months

## 2018-04-15 DIAGNOSIS — Z951 Presence of aortocoronary bypass graft: Secondary | ICD-10-CM | POA: Diagnosis not present

## 2018-04-15 DIAGNOSIS — I10 Essential (primary) hypertension: Secondary | ICD-10-CM | POA: Diagnosis not present

## 2018-04-15 DIAGNOSIS — I2510 Atherosclerotic heart disease of native coronary artery without angina pectoris: Secondary | ICD-10-CM | POA: Diagnosis not present

## 2018-04-15 DIAGNOSIS — E785 Hyperlipidemia, unspecified: Secondary | ICD-10-CM | POA: Diagnosis not present

## 2018-07-17 DIAGNOSIS — Z23 Encounter for immunization: Secondary | ICD-10-CM | POA: Diagnosis not present

## 2018-10-02 ENCOUNTER — Telehealth: Payer: Self-pay | Admitting: Internal Medicine

## 2018-10-02 NOTE — Telephone Encounter (Signed)
Copied from [place] [id number]. Topic: Quick Communication - See Telephone Encounter >> Oct 02, 2018  2:14 PM Blase Mess A wrote: CRM for notification. See Telephone encounter for: 10/02/18.  Patient is calling because Dr. Sharion Balloon, cardiologist recommended the patient to have lipid panel done. Has he had this done recently or does he need to have this completed? Please advise [phone]

## 2018-10-02 NOTE — Telephone Encounter (Signed)
Lipid panel from July faxed to Dr. Bjorn Pippin in [place] at patient's request.

## 2018-10-08 ENCOUNTER — Ambulatory Visit (INDEPENDENT_AMBULATORY_CARE_PROVIDER_SITE_OTHER): Payer: Medicare Other | Admitting: Internal Medicine

## 2018-10-08 ENCOUNTER — Encounter: Payer: Self-pay | Admitting: Internal Medicine

## 2018-10-08 VITALS — BP 118/74 | HR 65 | Temp 97.8°F | Resp 16 | Ht 68.0 in | Wt 172.5 lb

## 2018-10-08 DIAGNOSIS — I25810 Atherosclerosis of coronary artery bypass graft(s) without angina pectoris: Secondary | ICD-10-CM | POA: Diagnosis not present

## 2018-10-08 DIAGNOSIS — R739 Hyperglycemia, unspecified: Secondary | ICD-10-CM | POA: Diagnosis not present

## 2018-10-08 DIAGNOSIS — E538 Deficiency of other specified B group vitamins: Secondary | ICD-10-CM

## 2018-10-08 DIAGNOSIS — E785 Hyperlipidemia, unspecified: Secondary | ICD-10-CM

## 2018-10-08 LAB — LIPID PANEL
Cholesterol: 133 mg/dL (ref 0–200)
HDL: 59.7 mg/dL (ref >39.00)
LDL Cholesterol: 60 mg/dL (ref 0–99)
NonHDL: 73.44
Total CHOL/HDL Ratio: 2
Triglycerides: 66 mg/dL (ref 0.0–149.0)
VLDL: 13.2 mg/dL (ref 0.0–40.0)

## 2018-10-08 LAB — BASIC METABOLIC PANEL
BUN: 24 mg/dL — ABNORMAL HIGH (ref 6–23)
CO2: 27 mEq/L (ref 19–32)
Calcium: 9.4 mg/dL (ref 8.4–10.5)
Chloride: 106 mEq/L (ref 96–112)
Creatinine, Ser: 1.27 mg/dL (ref 0.40–1.50)
GFR: 58.51 mL/min — ABNORMAL LOW (ref >60.00)
Glucose, Bld: 93 mg/dL (ref 70–99)
Potassium: 4.2 mEq/L (ref 3.5–5.1)
Sodium: 140 mEq/L (ref 135–145)

## 2018-10-08 LAB — VITAMIN B12: Vitamin B-12: 584 pg/mL (ref 211–911)

## 2018-10-08 NOTE — Assessment & Plan Note (Signed)
Hyperglycemia: Stable per last A1c HTN: Currently on no medications.  BP is very good CAD: On aspirin and Lipitor.  Brilinta stopped.  Check BMP and FLP. Will share results with Dr. Prince Rome B12 deficiency: On no supplements, patient likes it recheck.  Will do RTC 04/2019.

## 2018-10-08 NOTE — Progress Notes (Signed)
Pre visit review using our clinic review tool, if applicable. No additional management support is needed unless otherwise documented below in the visit note. 

## 2018-10-08 NOTE — Progress Notes (Addendum)
Subjective:    Patient ID: Tyler Ortega, male    DOB: 1940-12-21, 78 y.o.   MRN: [id number]  DOS:  10/08/2018 Type of visit - description: Routine visit Since the last office visit he is doing well, good compliance with medication.  Saw his cardiologist, medications were adjusted.    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  No chest pain no difficulty breathing  Past Medical History:  Diagnosis Date  . Arthritis    osteoarthritis -hips, back pain tx with Prednisone-last dose 09-04-15.  Marland Kitchen CAD (coronary artery disease)    MI 2011, seen @ [name], sees Public librarian (once a year)  . Hyperlipidemia   . Hypertension   . Skin cancer    R calf and the ear (?side)  . Transfusion history    2nd Hip surgery    Past Surgical History:  Procedure Laterality Date  . COLONOSCOPY  05/29/11   Normal  . CORONARY ARTERY BYPASS GRAFT     05-2010-x4 vessel -[place] Bilateral 2019   cataract  . HIP SURGERY     x3 - L hip (initial hip replacement bleed)  . INGUINAL HERNIA REPAIR Left   . TOTAL HIP ARTHROPLASTY Right 09/12/2015   Procedure: RIGHT TOTAL HIP ARTHROPLASTY ANTERIOR APPROACH;  Surgeon: Paralee Cancel, MD;  Location: [facility];  Service: Orthopedics;  Laterality: Right;  Marland Kitchen VASECTOMY      Social History   Socioeconomic History  . Marital status: Married    Spouse name: [name]  . Number of children: 3  . Years of education: Some colle  . Highest education level: Not on file  Occupational History  . Occupation: Armed forces operational officer, Retired    Fish farm manager: OTHER  Social Needs  . Financial resource strain: Not on file  . Food insecurity:    Worry: Not on file    Inability: Not on file  . Transportation needs:    Medical: Not on file    Non-medical: Not on file  Tobacco Use  . Smoking status: Never Smoker  . Smokeless tobacco: Never Used  Substance and Sexual Activity  . Alcohol use: Yes    Comment:  rarely   . Drug use: No  . Sexual activity: Not on file  Lifestyle  . Physical activity:    Days per week: Not on file    Minutes per session: Not on file  . Stress: Not on file  Relationships  . Social connections:    Talks on phone: Not on file    Gets together: Not on file    Attends religious service: Not on file    Active member of club or organization: Not on file    Attends meetings of clubs or organizations: Not on file    Relationship status: Not on file  . Intimate partner violence:    Fear of current or ex partner: Not on file    Emotionally abused: Not on file    Physically abused: Not on file    Forced sexual activity: Not on file  Other Topics Concern  . Not on file  Social History Narrative   1 Daughter lives in [place], lives w/ wife    Caffeine use: 1-2 cups coffee per day   Soda: rare      Allergies as of 10/08/2018   No Known Allergies     Medication List       Accurate as of October 08, 2018 11:59 PM. Always use your most recent med list.        aspirin EC 81 MG tablet Take 81 mg by mouth daily.   atorvastatin 80 MG tablet Commonly known as:  LIPITOR Take 80 mg by mouth daily.           Objective:   Physical Exam BP 118/74 (BP Location: Left Arm, Patient Position: Sitting, Cuff Size: Small)   Pulse 65   Temp 97.8 F (36.6 C) (Oral)   Resp 16   Ht 5\' 8"  (1.727 m)   Wt 172 lb 8 oz (78.2 kg)   SpO2 98%   BMI 26.23 kg/m  General:   Well developed, NAD, BMI noted. HEENT:  Normocephalic . Face symmetric, atraumatic Lungs:  CTA B Normal respiratory effort, no intercostal retractions, no accessory muscle use. Heart: RRR,  no murmur.  No pretibial edema bilaterally  Skin: Not pale. Not jaundice Neurologic:  alert & oriented X3.  Speech normal, gait appropriate for age and unassisted Psych--  Cognition and judgment appear intact.  Cooperative with normal attention span and concentration.  Behavior appropriate. No anxious or  depressed appearing.      Assessment     Assessment Hyperglycemia: A1c 6.45/2017 HTN Hyperlipidemia CAD: Dr. Prince Rome --MI and CABG 2011 --[facility]> admitted>  cath 07-2017, had a stent GI bleeding: Hg dropped to 4.8 after cath 07/2017, + hematemesis  EGD EG junction ulceration, status post endoscopy treatment (clipped) Transient amnesia: saw neuro. 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 (likely chronic, unrelated finding per neurology); EEG (-) B12 def dx 02-2106 ABX pre dental work request by ortho d/t hip surgeries    PLAN: Hyperglycemia: Stable per last A1c HTN: Currently on no medications.  BP is very good CAD: On aspirin and Lipitor.  Brilinta stopped.  Check BMP and FLP. Will share results with Dr. Prince Rome B12 deficiency: On no supplements, patient likes it recheck.  Will do RTC 04/2019.

## 2018-10-08 NOTE — Patient Instructions (Addendum)
GO TO THE LAB : Get the blood work     GO TO THE FRONT DESK Schedule your next appointment for a  Physical by 04/2019

## 2018-12-07 DIAGNOSIS — H6122 Impacted cerumen, left ear: Secondary | ICD-10-CM | POA: Diagnosis not present

## 2018-12-07 DIAGNOSIS — H9193 Unspecified hearing loss, bilateral: Secondary | ICD-10-CM | POA: Diagnosis not present

## 2018-12-07 DIAGNOSIS — J342 Deviated nasal septum: Secondary | ICD-10-CM | POA: Diagnosis not present

## 2018-12-07 DIAGNOSIS — J343 Hypertrophy of nasal turbinates: Secondary | ICD-10-CM | POA: Insufficient documentation

## 2019-01-12 DIAGNOSIS — H6122 Impacted cerumen, left ear: Secondary | ICD-10-CM | POA: Diagnosis not present

## 2019-01-12 DIAGNOSIS — H9193 Unspecified hearing loss, bilateral: Secondary | ICD-10-CM | POA: Diagnosis not present

## 2019-03-15 DIAGNOSIS — E785 Hyperlipidemia, unspecified: Secondary | ICD-10-CM | POA: Diagnosis not present

## 2019-04-08 DIAGNOSIS — Z96643 Presence of artificial hip joint, bilateral: Secondary | ICD-10-CM | POA: Diagnosis not present

## 2019-04-08 DIAGNOSIS — Z471 Aftercare following joint replacement surgery: Secondary | ICD-10-CM | POA: Diagnosis not present

## 2019-04-08 DIAGNOSIS — T8484XA Pain due to internal orthopedic prosthetic devices, implants and grafts, initial encounter: Secondary | ICD-10-CM | POA: Diagnosis not present

## 2019-04-08 DIAGNOSIS — Z96642 Presence of left artificial hip joint: Secondary | ICD-10-CM | POA: Diagnosis not present

## 2019-04-12 DIAGNOSIS — Z96641 Presence of right artificial hip joint: Secondary | ICD-10-CM | POA: Diagnosis not present

## 2019-04-22 ENCOUNTER — Other Ambulatory Visit: Payer: Self-pay

## 2019-04-22 ENCOUNTER — Ambulatory Visit (INDEPENDENT_AMBULATORY_CARE_PROVIDER_SITE_OTHER): Payer: Medicare Other | Admitting: Internal Medicine

## 2019-04-22 ENCOUNTER — Encounter: Payer: Self-pay | Admitting: Internal Medicine

## 2019-04-22 VITALS — BP 142/83 | HR 58 | Temp 98.2°F | Resp 16 | Ht 68.0 in | Wt 171.5 lb

## 2019-04-22 DIAGNOSIS — R739 Hyperglycemia, unspecified: Secondary | ICD-10-CM

## 2019-04-22 DIAGNOSIS — E559 Vitamin D deficiency, unspecified: Secondary | ICD-10-CM | POA: Diagnosis not present

## 2019-04-22 DIAGNOSIS — Z0000 Encounter for general adult medical examination without abnormal findings: Secondary | ICD-10-CM

## 2019-04-22 DIAGNOSIS — E785 Hyperlipidemia, unspecified: Secondary | ICD-10-CM | POA: Diagnosis not present

## 2019-04-22 LAB — LIPID PANEL
Cholesterol: 126 mg/dL (ref 0–200)
HDL: 56.4 mg/dL (ref >39.00)
LDL Cholesterol: 60 mg/dL (ref 0–99)
NonHDL: 69.43
Total CHOL/HDL Ratio: 2
Triglycerides: 48 mg/dL (ref 0.0–149.0)
VLDL: 9.6 mg/dL (ref 0.0–40.0)

## 2019-04-22 LAB — CBC WITH DIFFERENTIAL/PLATELET
Basophils Absolute: 0 10*3/uL (ref 0.0–0.1)
Basophils Relative: 0.7 % (ref 0.0–3.0)
Eosinophils Absolute: 0.3 10*3/uL (ref 0.0–0.7)
Eosinophils Relative: 5.1 % — ABNORMAL HIGH (ref 0.0–5.0)
HCT: 43.2 % (ref 39.0–52.0)
Hemoglobin: 14.6 g/dL (ref 13.0–17.0)
Lymphocytes Relative: 23.4 % (ref 12.0–46.0)
Lymphs Abs: 1.6 10*3/uL (ref 0.7–4.0)
MCHC: 33.8 g/dL (ref 30.0–36.0)
MCV: 99.2 fl (ref 78.0–100.0)
Monocytes Absolute: 0.5 10*3/uL (ref 0.1–1.0)
Monocytes Relative: 8 % (ref 3.0–12.0)
Neutro Abs: 4.2 10*3/uL (ref 1.4–7.7)
Neutrophils Relative %: 62.8 % (ref 43.0–77.0)
Platelets: 166 10*3/uL (ref 150.0–400.0)
RBC: 4.35 Mil/uL (ref 4.22–5.81)
RDW: 14.1 % (ref 11.5–15.5)
WBC: 6.6 10*3/uL (ref 4.0–10.5)

## 2019-04-22 LAB — VITAMIN D 25 HYDROXY (VIT D DEFICIENCY, FRACTURES): VITD: 34.84 ng/mL (ref 30.00–100.00)

## 2019-04-22 LAB — COMPREHENSIVE METABOLIC PANEL
ALT: 15 U/L (ref 0–53)
AST: 20 U/L (ref 0–37)
Albumin: 4.2 g/dL (ref 3.5–5.2)
Alkaline Phosphatase: 70 U/L (ref 39–117)
BUN: 23 mg/dL (ref 6–23)
CO2: 28 mEq/L (ref 19–32)
Calcium: 9.3 mg/dL (ref 8.4–10.5)
Chloride: 105 mEq/L (ref 96–112)
Creatinine, Ser: 1.17 mg/dL (ref 0.40–1.50)
GFR: 60.43 mL/min (ref >60.00)
Glucose, Bld: 91 mg/dL (ref 70–99)
Potassium: 4.7 mEq/L (ref 3.5–5.1)
Sodium: 141 mEq/L (ref 135–145)
Total Bilirubin: 0.8 mg/dL (ref 0.2–1.2)
Total Protein: 6.3 g/dL (ref 6.0–8.3)

## 2019-04-22 LAB — HEMOGLOBIN A1C: Hgb A1c MFr Bld: 6.1 % (ref 4.6–6.5)

## 2019-04-22 LAB — PSA: PSA: 1.16 ng/mL (ref 0.10–4.00)

## 2019-04-22 MED ORDER — SHINGRIX 50 MCG/0.5ML IM SUSR: 0.5000 mL | Freq: Once | INTRAMUSCULAR | 1 refills | Status: AC

## 2019-04-22 NOTE — Assessment & Plan Note (Signed)
-  Td 2012 - Pneumonia shot: 2015 - prevnar: 2016 -shingrix discussed,  Rx printed -Strongly recommend flu shot early this year --CCS:  cscope normal 2012, next 10 years  --Cancer screening: DRE normal 04/2018, check a  PSA  --Labs: CMP, FLP, CBC, A1c, PSA, vitamin D --Cont healthy life style

## 2019-04-22 NOTE — Patient Instructions (Addendum)
GO TO THE LAB : Get the blood work     GO TO THE FRONT DESK Schedule your next appointment  For a physical in 1 year     Check the  blood pressure 2 or 3 times a month   Be sure your blood pressure is between 110/65 and  135/85. If it is consistently higher or lower, let me know    See you next year,  call sooner if you need me.  It was good to see you today

## 2019-04-22 NOTE — Progress Notes (Signed)
Subjective:    Patient ID: Tyler Ortega, male    DOB: 10-02-1941, 78 y.o.   MRN: [id number]  DOS:  04/22/2019 Type of visit - description: CPX In general doing well. He  stays active physically and mentally, plays golf regularly. Having some problems with the left hip, under the care of the orthopedic doctor   Review of Systems  Other than above, a 14 point review of systems is negative    Past Medical History:  Diagnosis Date  . Arthritis    osteoarthritis -hips, back pain tx with Prednisone-last dose 09-04-15.  Marland Kitchen CAD (coronary artery disease)    MI 2011, seen @ [name], sees Public librarian (once a year)  . Hyperlipidemia   . Hypertension   . Skin cancer    R calf and the ear (?side)  . Transfusion history    2nd Hip surgery    Past Surgical History:  Procedure Laterality Date  . COLONOSCOPY  05/29/11   Normal  . CORONARY ARTERY BYPASS GRAFT     05-2010-x4 vessel -[place] Bilateral 2019   cataract  . HIP SURGERY Left    x3 - L hip (initial hip replacement bleed)  . INGUINAL HERNIA REPAIR Left   . TOTAL HIP ARTHROPLASTY Right 09/12/2015   Procedure: RIGHT TOTAL HIP ARTHROPLASTY ANTERIOR APPROACH;  Surgeon: Paralee Cancel, MD;  Location: [facility];  Service: Orthopedics;  Laterality: Right;  Marland Kitchen VASECTOMY     Family History  Problem Relation Age of Onset  . Coronary artery disease Neg Hx   . Diabetes Neg Hx   . Colon cancer Neg Hx   . Prostate cancer Neg Hx   . Seizures Neg Hx   . Multiple sclerosis Neg Hx   . Migraines Neg Hx   . Dementia Neg Hx      Social History   Socioeconomic History  . Marital status: Married    Spouse name: [name]  . Number of children: 3  . Years of education: Some colle  . Highest education level: Not on file  Occupational History  . Occupation: Armed forces operational officer, Retired    Fish farm manager: OTHER  Social Needs  . Financial resource strain: Not on file  . Food insecurity    Worry: Not on file    Inability: Not  on file  . Transportation needs    Medical: Not on file    Non-medical: Not on file  Tobacco Use  . Smoking status: Never Smoker  . Smokeless tobacco: Never Used  Substance and Sexual Activity  . Alcohol use: Yes    Comment: rarely   . Drug use: No  . Sexual activity: Not on file  Lifestyle  . Physical activity    Days per week: Not on file    Minutes per session: Not on file  . Stress: Not on file  Relationships  . Social Herbalist on phone: Not on file    Gets together: Not on file    Attends religious service: Not on file    Active member of club or organization: Not on file    Attends meetings of clubs or organizations: Not on file    Relationship status: Not on file  . Intimate partner violence    Fear of current or ex partner: Not on file    Emotionally abused: Not on file    Physically abused: Not on file    Forced sexual activity: Not on file  Other  Topics Concern  . Not on file  Social History Narrative   1 Daughter lives in [place], lives w/ wife    Caffeine use: 1-2 cups coffee per day   Soda: rare      Allergies as of 04/22/2019   No Known Allergies     Medication List       Accurate as of April 22, 2019 11:59 PM. If you have any questions, ask your nurse or doctor.        aspirin EC 81 MG tablet Take 81 mg by mouth daily.   atorvastatin 80 MG tablet Commonly known as: LIPITOR Take 80 mg by mouth daily.   Shingrix injection Generic drug: Zoster Vaccine Adjuvanted Inject 0.5 mLs into the muscle once for 1 dose. Started by: [name], MD           Objective:   Physical Exam BP (!) 142/83 (BP Location: Left Arm, Patient Position: Sitting, Cuff Size: Small)   Pulse (!) 58   Temp 98.2 F (36.8 C) (Oral)   Resp 16   Ht 5\' 8"  (1.727 m)   Wt 171 lb 8 oz (77.8 kg)   SpO2 100%   BMI 26.08 kg/m  General: Well developed, NAD, BMI noted Neck: No  thyromegaly  HEENT:  Normocephalic . Face symmetric, atraumatic Lungs:  CTA B  Normal respiratory effort, no intercostal retractions, no accessory muscle use. Heart: RRR,  no murmur.  No pretibial edema bilaterally  Abdomen:  Not distended, soft, non-tender. No rebound or rigidity.   Skin: Exposed areas without rash. Not pale. Not jaundice Neurologic:  alert & oriented X3.  Speech normal, gait appropriate for age and unassisted Strength symmetric and appropriate for age.  Psych: Cognition and judgment appear intact.  Cooperative with normal attention span and concentration.  Behavior appropriate. No anxious or depressed appearing.     Assessment     Assessment Hyperglycemia: A1c 6.45/2017 HTN Hyperlipidemia CAD: Dr. Prince Rome --MI and CABG 2011 --[facility]> admitted>  cath 07-2017, had a stent GI bleeding: Hg dropped to 4.8 after cath 07/2017, + hematemesis  EGD EG junction ulceration, status post endoscopy treatment (clipped) Transient amnesia: saw neuro. 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 (likely chronic, unrelated finding per neurology); EEG (-) B12 def dx 02-2106 ABX pre dental work request by ortho d/t hip surgeries    PLAN: Hyperglycemia: Good lifestyle, check A1c. HTN: BP today 142/83, at home is typically in the 120s.  Treatment is lifestyle. Hyperlipidemia: On Lipitor, checking labs CAD: Asymptomatic, to see cardiology this month Vitamin D deficiency: On no supplements, checking levels B12 was slightly low at some point, taking oral supplements. RTC 1 year.

## 2019-04-22 NOTE — Progress Notes (Signed)
Pre visit review using our clinic review tool, if applicable. No additional management support is needed unless otherwise documented below in the visit note. 

## 2019-04-23 NOTE — Assessment & Plan Note (Signed)
Hyperglycemia: Good lifestyle, check A1c. HTN: BP today 142/83, at home is typically in the 120s.  Treatment is lifestyle. Hyperlipidemia: On Lipitor, checking labs CAD: Asymptomatic, to see cardiology this month Vitamin D deficiency: On no supplements, checking levels B12 was slightly low at some point, taking oral supplements. RTC 1 year.

## 2019-04-29 DIAGNOSIS — D229 Melanocytic nevi, unspecified: Secondary | ICD-10-CM | POA: Diagnosis not present

## 2019-04-29 DIAGNOSIS — M1612 Unilateral primary osteoarthritis, left hip: Secondary | ICD-10-CM | POA: Diagnosis not present

## 2019-04-29 DIAGNOSIS — L821 Other seborrheic keratosis: Secondary | ICD-10-CM | POA: Diagnosis not present

## 2019-04-29 DIAGNOSIS — Z96642 Presence of left artificial hip joint: Secondary | ICD-10-CM | POA: Diagnosis not present

## 2019-04-29 DIAGNOSIS — L814 Other melanin hyperpigmentation: Secondary | ICD-10-CM | POA: Diagnosis not present

## 2019-04-29 DIAGNOSIS — L570 Actinic keratosis: Secondary | ICD-10-CM | POA: Diagnosis not present

## 2019-05-03 DIAGNOSIS — L821 Other seborrheic keratosis: Secondary | ICD-10-CM | POA: Diagnosis not present

## 2019-05-05 DIAGNOSIS — Z96642 Presence of left artificial hip joint: Secondary | ICD-10-CM | POA: Diagnosis not present

## 2019-05-05 DIAGNOSIS — T8484XA Pain due to internal orthopedic prosthetic devices, implants and grafts, initial encounter: Secondary | ICD-10-CM | POA: Diagnosis not present

## 2019-05-26 DIAGNOSIS — Z96641 Presence of right artificial hip joint: Secondary | ICD-10-CM | POA: Diagnosis not present

## 2019-05-26 DIAGNOSIS — M25562 Pain in left knee: Secondary | ICD-10-CM | POA: Diagnosis not present

## 2019-06-02 DIAGNOSIS — M25562 Pain in left knee: Secondary | ICD-10-CM | POA: Diagnosis not present

## 2019-06-02 DIAGNOSIS — M238X2 Other internal derangements of left knee: Secondary | ICD-10-CM | POA: Diagnosis not present

## 2019-06-07 DIAGNOSIS — Z951 Presence of aortocoronary bypass graft: Secondary | ICD-10-CM | POA: Diagnosis not present

## 2019-06-07 DIAGNOSIS — I10 Essential (primary) hypertension: Secondary | ICD-10-CM | POA: Diagnosis not present

## 2019-06-07 DIAGNOSIS — I440 Atrioventricular block, first degree: Secondary | ICD-10-CM | POA: Diagnosis not present

## 2019-06-07 DIAGNOSIS — E785 Hyperlipidemia, unspecified: Secondary | ICD-10-CM | POA: Diagnosis not present

## 2019-06-10 DIAGNOSIS — M25562 Pain in left knee: Secondary | ICD-10-CM | POA: Diagnosis not present

## 2019-06-16 DIAGNOSIS — M25562 Pain in left knee: Secondary | ICD-10-CM | POA: Diagnosis not present

## 2019-06-16 DIAGNOSIS — S83242A Other tear of medial meniscus, current injury, left knee, initial encounter: Secondary | ICD-10-CM | POA: Diagnosis not present

## 2019-08-16 ENCOUNTER — Other Ambulatory Visit: Payer: Self-pay

## 2019-08-17 ENCOUNTER — Ambulatory Visit: Payer: Medicare Other | Admitting: Internal Medicine

## 2019-08-18 ENCOUNTER — Other Ambulatory Visit: Payer: Self-pay

## 2019-08-18 ENCOUNTER — Ambulatory Visit (INDEPENDENT_AMBULATORY_CARE_PROVIDER_SITE_OTHER): Payer: Medicare Other | Admitting: Internal Medicine

## 2019-08-18 ENCOUNTER — Encounter: Payer: Self-pay | Admitting: Internal Medicine

## 2019-08-18 VITALS — BP 149/76 | HR 61 | Temp 97.3°F | Resp 16 | Ht 68.0 in | Wt 172.2 lb

## 2019-08-18 DIAGNOSIS — R410 Disorientation, unspecified: Secondary | ICD-10-CM

## 2019-08-18 DIAGNOSIS — G459 Transient cerebral ischemic attack, unspecified: Secondary | ICD-10-CM

## 2019-08-18 NOTE — Patient Instructions (Addendum)
Please schedule Medicare Wellness with [facility].  

## 2019-08-18 NOTE — Progress Notes (Signed)
Pre visit review using our clinic review tool, if applicable. No additional management support is needed unless otherwise documented below in the visit note. 

## 2019-08-18 NOTE — Progress Notes (Signed)
Subjective:    Patient ID: Tyler Ortega, male    DOB: [birth year], 78 y.o.   MRN: [id number]  DOS:  08/18/2019 Type of visit - description: Acute visit, here with his wife On 08/16/2019, he woke up, started to get ready to go to a function at church, when he went back to the second floor wife noted him to be confused, he was wearing jeans which he never does when he goes to church. The whole episode lasted approximately 1.5 hours. After that he felt back to normal, did recall that something was wrong. He continued on with his day, drove to church and had no further problems.  They deny a history of chronic memory problems  Review of Systems  Denies fever chills No chest pain no difficulty breathing No palpitations No nausea or vomiting No headache, dizziness, diplopia, slurred speech. No motor deficits.  Past Medical History:  Diagnosis Date  . Arthritis    osteoarthritis -hips, back pain tx with Prednisone-last dose 09-04-15.  Marland Kitchen CAD (coronary artery disease)    MI 2011, seen @ [name], sees Public librarian (once a year)  . Hyperlipidemia   . Hypertension   . Skin cancer    R calf and the ear (?side)  . Transfusion history    2nd Hip surgery    Past Surgical History:  Procedure Laterality Date  . COLONOSCOPY  05/29/11   Normal  . CORONARY ARTERY BYPASS GRAFT     05-2010-x4 vessel -[place] Bilateral 2019   cataract  . HIP SURGERY Left    x3 - L hip (initial hip replacement bleed)  . INGUINAL HERNIA REPAIR Left   . TOTAL HIP ARTHROPLASTY Right 09/12/2015   Procedure: RIGHT TOTAL HIP ARTHROPLASTY ANTERIOR APPROACH;  Surgeon: Paralee Cancel, MD;  Location: [facility];  Service: Orthopedics;  Laterality: Right;  Marland Kitchen VASECTOMY      Social History   Socioeconomic History  . Marital status: Married    Spouse name: [name]  . Number of children: 3  . Years of education: Some colle  . Highest education level: Not on file  Occupational History  .  Occupation: Armed forces operational officer, Retired    Fish farm manager: OTHER  Social Needs  . Financial resource strain: Not on file  . Food insecurity    Worry: Not on file    Inability: Not on file  . Transportation needs    Medical: Not on file    Non-medical: Not on file  Tobacco Use  . Smoking status: Never Smoker  . Smokeless tobacco: Never Used  Substance and Sexual Activity  . Alcohol use: Yes    Comment: rarely   . Drug use: No  . Sexual activity: Not on file  Lifestyle  . Physical activity    Days per week: Not on file    Minutes per session: Not on file  . Stress: Not on file  Relationships  . Social Herbalist on phone: Not on file    Gets together: Not on file    Attends religious service: Not on file    Active member of club or organization: Not on file    Attends meetings of clubs or organizations: Not on file    Relationship status: Not on file  . Intimate partner violence    Fear of current or ex partner: Not on file    Emotionally abused: Not on file    Physically abused: Not on file  Forced sexual activity: Not on file  Other Topics Concern  . Not on file  Social History Narrative   1 Daughter lives in [place], lives w/ wife    Caffeine use: 1-2 cups coffee per day   Soda: rare      Allergies as of 08/18/2019   No Known Allergies     Medication List       Accurate as of August 18, 2019 11:59 PM. If you have any questions, ask your nurse or doctor.        aspirin EC 81 MG tablet Take 81 mg by mouth daily.   atorvastatin 80 MG tablet Commonly known as: LIPITOR Take 80 mg by mouth daily.   cholecalciferol 25 MCG (1000 UT) tablet Commonly known as: VITAMIN D3 Take 1,000 Units by mouth daily.   vitamin B-12 1000 MCG tablet Commonly known as: CYANOCOBALAMIN Take 5,000 mcg by mouth daily.           Objective:   Physical Exam BP (!) 149/76 (BP Location: Left Arm, Patient Position: Sitting, Cuff Size: Small)   Pulse 61   Temp (!) 97.3 F  (36.3 C) (Temporal)   Resp 16   Ht 5\' 8"  (1.727 m)   Wt 172 lb 4 oz (78.1 kg)   SpO2 99%   BMI 26.19 kg/m   General:   Well developed, NAD, BMI noted. HEENT:  Normocephalic . Face symmetric, atraumatic Neck: Good carotid pulses Lungs:  CTA B Normal respiratory effort, no intercostal retractions, no accessory muscle use. Heart: RRR,  no murmur.  No pretibial edema bilaterally  Skin: Not pale. Not jaundice Neurologic:  alert & oriented X3.  Speech normal, gait appropriate for age and unassisted Motor DTR symmetric Psych--  Cognition and judgment appear intact.  Cooperative with normal attention span and concentration.  Behavior appropriate. No anxious or depressed appearing.      Assessment     Assessment Hyperglycemia: A1c 6.45/2017 HTN Hyperlipidemia CAD: Dr. Prince Rome --MI and CABG 2011 --[facility]> admitted>  cath 07-2017, had a stent GI bleeding: Hg dropped to 4.8 after cath 07/2017, + hematemesis  EGD EG junction ulceration, status post endoscopy treatment (clipped) Transient amnesia: saw neuro. 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 (likely chronic, unrelated finding per neurology); EEG (-) B12 def dx [id number] ABX pre dental work request by ortho d/t hip surgeries    PLAN: Confusion: Episode of confusion as described above, h/o transient amnesia. Neurological exam is normal today. DDx TIA,  episode of transient amnesia vs others. For now we will continue with the same medications, get a brain MRI, referral to neurology.  In the meantime if he has any strokelike symptoms or other concerning problems he needs to reach for 911.  The patient and wife verbalized understanding. B12 deficiency: Good compliance with oral supplements, last B12 satisfactory CAD: No symptoms, last visit with Dr. Harlow Mares 05-2019, EKG was done at that time.

## 2019-08-20 NOTE — Assessment & Plan Note (Signed)
Confusion: Episode of confusion as described above, h/o transient amnesia. Neurological exam is normal today. DDx TIA,  episode of transient amnesia vs others. For now we will continue with the same medications, get a brain MRI, referral to neurology.  In the meantime if he has any strokelike symptoms or other concerning problems he needs to reach for 911.  The patient and wife verbalized understanding. B12 deficiency: Good compliance with oral supplements, last B12 satisfactory CAD: No symptoms, last visit with Dr. Harlow Mares 05-2019, EKG was done at that time.

## 2019-08-23 ENCOUNTER — Other Ambulatory Visit: Payer: Self-pay

## 2019-08-23 ENCOUNTER — Ambulatory Visit (INDEPENDENT_AMBULATORY_CARE_PROVIDER_SITE_OTHER): Payer: Medicare Other

## 2019-08-23 DIAGNOSIS — R410 Disorientation, unspecified: Secondary | ICD-10-CM | POA: Diagnosis not present

## 2019-08-23 DIAGNOSIS — G459 Transient cerebral ischemic attack, unspecified: Secondary | ICD-10-CM

## 2019-08-30 ENCOUNTER — Other Ambulatory Visit: Payer: Self-pay

## 2019-08-30 DIAGNOSIS — Z20822 Contact with and (suspected) exposure to covid-19: Secondary | ICD-10-CM

## 2019-08-31 LAB — NOVEL CORONAVIRUS, NAA: SARS-CoV-2, NAA: NOT DETECTED

## 2019-09-22 ENCOUNTER — Telehealth (INDEPENDENT_AMBULATORY_CARE_PROVIDER_SITE_OTHER): Payer: Medicare Other | Admitting: Neurology

## 2019-09-22 ENCOUNTER — Encounter: Payer: Self-pay | Admitting: *Deleted

## 2019-09-22 ENCOUNTER — Telehealth: Payer: Self-pay | Admitting: Radiology

## 2019-09-22 ENCOUNTER — Telehealth: Payer: Self-pay | Admitting: *Deleted

## 2019-09-22 DIAGNOSIS — R002 Palpitations: Secondary | ICD-10-CM

## 2019-09-22 DIAGNOSIS — I63443 Cerebral infarction due to embolism of bilateral cerebellar arteries: Secondary | ICD-10-CM | POA: Diagnosis not present

## 2019-09-22 MED ORDER — CLOPIDOGREL BISULFATE 75 MG PO TABS: 75.0000 mg | ORAL_TABLET | Freq: Every day | ORAL | 11 refills | Status: DC

## 2019-09-22 NOTE — Telephone Encounter (Signed)
Enrolled patient for a [employer] Event monitor to be mailed to patients home. Brief instructions were gone over with the patient and he knows to expect the monitor to arrive in 5-7 days.

## 2019-09-22 NOTE — Progress Notes (Signed)
[facility]    Provider:  [name] Referring Provider: Colon Branch, MD Primary Care Physician:  Colon Branch, MD  CC:  Temporary memory loss  Virtual Visit via Video Note  I connected with Tyler Ortega on 09/23/19 at 11:00 AM EST by a video enabled telemedicine application and verified that I am speaking with the correct person using two identifiers.  Location: Patient: home Provider: office   I discussed the limitations of evaluation and management by telemedicine and the availability of in person appointments. The patient expressed understanding and agreed to proceed.  History of Present Illness:    Observations/Objective:   Assessment and Plan:   Follow Up Instructions:    I discussed the assessment and treatment plan with the patient. The patient was provided an opportunity to ask questions and all were answered. The patient agreed with the plan and demonstrated an understanding of the instructions.   The patient was advised to call back or seek an in-person evaluation if the symptoms worsen or if the condition fails to improve as anticipated.   Melvenia Beam, MD    09/23/2019: Patient had an episode of alteration of awareness.  He was helping people get to the hospital, he was exhibiting alteration in his behavior, wife noticed he just was not there.  He was dressed inappropriately.  He was not there, per wife, per 7 minutes.  No loss of consciousness, no urination or defecation.  He eventually resolved.  No chest pain no shortness of breath.  No fall.  Patient just kind of seemed "out of it".  He does not really remember what happened.  MRI of the brain showed cerebellar chronic infarcts which is new from last MRI which is concerning for embolic phenomenon.  Really unclear what this episode was, he had an episode of transient global amnesia in the past and this was not similar.  It was concerning.  No focal deficits as far as strength or facial  droop or dysarthria or weakness it was more staring off into space and not really comprehending what was going on.  He had come down that morning and blue jeans which was unusual for the days activities.  We need to complete work-up, changed to Plavix, will need an entire stroke work-up and will likely need loop recorder due to embolic strokes and TIA.  He has an irregular heart rate she says he "skipped beats ", he can feel the skipping a beat sometimes, I wonder if he is having atrial fibrillation or some other arrhythmia that could be concerning for stroke risk or causing his alteration of awareness.   HPI:  Tyler Ortega is a 78 y.o. male here as a referral from [name] for transient memory loss for 2 hours. PMHx of CAD, HTN, HLD. He was exercising at the time. At the gym. He got home and couldn't remember being at the gym or driving home from the gym. Here with his wife who also provides information. Denies head trauma or inciting events, just exercising. Wife saw him and he was fine when he left. He went to the ED and he was fine there as well and a workup was complted.  He eventually did remember the time he spent in the gym. When he got home he was very confused, he walked back and forth in the home, what he kept saying was that he didn't feel right and he couldn't remember. Even afterwards for days he was not  the same per wife. When he got home from the gym he was confused but knew his wife, knew where he was, didn't remember coming home from the gym or driving home. The memories all eventually came back. Everything totally came back. He never had an incident like this,  No alcohol problems. They are eating a balanced diet. Denies headaches, no weakness, no speech difficulties, no other focal neurologic deficits during the incident. Workup in the ED showed severe B12 deficiency at 138 and he is getting shots.   Reviewed notes, labs and imaging from outside physicians, which showed:  Personally  reviewed imaging and agree with the following:  IMPRESSION: No acute infarct or intracranial hemorrhage.  Mild to moderate chronic microvascular changes.  Mild global atrophy without hydrocephalus.  Increased signal within the left sigmoid sinus may be related to slow flow although thrombosis cannot be entirely excluded. MR venography or CT venography can be obtained for further delineation if clinically desired.  US carotid 02/2016:  IMPRESSION: Prominent calcified shadowing plaque RIGHT carotid bulb.  By velocity measurements, this corresponds to a less than 50% diameter stenosis.  Minimal plaque LEFT carotid bulb without evidence of hemodynamically significant stenosis.  Patient arrhythmia during exam.  Labs 02/05/2016: Vitamin B12 118, HgbA1c 6.4, TSh 1.18, CBC and CMp unremarkable  Review of Systems: Patient complains of symptoms per HPI as well as the following symptoms: memory loss. Pertinent negatives per HPI. All others negative.   Social History   Socioeconomic History  . Marital status: Married    Spouse name: [name]  . Number of children: 3  . Years of education: Some college  . Highest education level: Not on file  Occupational History  . Occupation: Armed forces operational officer, Retired    Fish farm manager: OTHER  Tobacco Use  . Smoking status: Never Smoker  . Smokeless tobacco: Never Used  Substance and Sexual Activity  . Alcohol use: Yes    Comment: rarely, last 6 months ago  . Drug use: No  . Sexual activity: Not on file  Other Topics Concern  . Not on file  Social History Narrative   1 Daughter lives in [place], lives w/ wife    Caffeine use: 1-2 cups coffee per day   Soda: diet pepsi a few times a week   Right handed   Social Determinants of Health   Financial Resource Strain:   . Difficulty of Paying Living Expenses: Not on file  Food Insecurity:   . Worried About Charity fundraiser in the Last Year: Not on file  . Ran Out of Food in the Last Year:  Not on file  Transportation Needs:   . Lack of Transportation (Medical): Not on file  . Lack of Transportation (Non-Medical): Not on file  Physical Activity:   . Days of Exercise per Week: Not on file  . Minutes of Exercise per Session: Not on file  Stress:   . Feeling of Stress : Not on file  Social Connections:   . Frequency of Communication with Friends and Family: Not on file  . Frequency of Social Gatherings with Friends and Family: Not on file  . Attends Religious Services: Not on file  . Active Member of Clubs or Organizations: Not on file  . Attends Archivist Meetings: Not on file  . Marital Status: Not on file  Intimate Partner Violence:   . Fear of Current or Ex-Partner: Not on file  . Emotionally Abused: Not on file  . Physically Abused: Not  on file  . Sexually Abused: Not on file    Family History  Problem Relation Age of Onset  . Coronary artery disease Neg Hx   . Diabetes Neg Hx   . Colon cancer Neg Hx   . Prostate cancer Neg Hx   . Seizures Neg Hx   . Multiple sclerosis Neg Hx   . Migraines Neg Hx   . Dementia Neg Hx     Past Medical History:  Diagnosis Date  . Arthritis    osteoarthritis -hips, back pain tx with Prednisone-last dose 09-04-15.  Marland Kitchen CAD (coronary artery disease)    MI 2011, seen @ [name], sees Public librarian (once a year)  . Hyperlipidemia   . Hypertension   . Skin cancer    R calf and the ear (?side)  . Transfusion history    2nd Hip surgery    Past Surgical History:  Procedure Laterality Date  . COLONOSCOPY  05/29/11   Normal  . CORONARY ARTERY BYPASS GRAFT     05-2010-x4 vessel -[place] Bilateral 2019   cataract  . HIP SURGERY Left    x3 - L hip (initial hip replacement bleed)  . INGUINAL HERNIA REPAIR Left   . TOTAL HIP ARTHROPLASTY Right 09/12/2015   Procedure: RIGHT TOTAL HIP ARTHROPLASTY ANTERIOR APPROACH;  Surgeon: Paralee Cancel, MD;  Location: [facility];  Service: Orthopedics;  Laterality:  Right;  Marland Kitchen VASECTOMY      Current Outpatient Medications  Medication Sig Dispense Refill  . aspirin EC 81 MG tablet Take 81 mg by mouth daily.    Marland Kitchen atorvastatin (LIPITOR) 80 MG tablet Take 80 mg by mouth daily.    . cholecalciferol (VITAMIN D3) 25 MCG (1000 UT) tablet Take 1,000 Units by mouth daily.    . clopidogrel (PLAVIX) 75 MG tablet Take 1 tablet (75 mg total) by mouth daily. 30 tablet 11  . Multiple Vitamin (MULTIVITAMIN PO) Take 1 tablet by mouth daily.    . vitamin B-12 (CYANOCOBALAMIN) 1000 MCG tablet Take 5,000 mcg by mouth daily.     No current facility-administered medications for this visit.    Allergies as of 09/22/2019  . (No Known Allergies)    Vitals: There were no vitals taken for this visit. Last Weight:  Wt Readings from Last 1 Encounters:  09/22/19 168 lb 6.4 oz (76.4 kg)   Last Height:   Ht Readings from Last 1 Encounters:  09/22/19 5\' 8"  (1.727 m)   Physical exam: Exam: Gen: NAD, conversant, well nourised,  well groomed                     CV: RRR, no MRG. No Carotid Bruits. No peripheral edema, warm, nontender Eyes: Conjunctivae clear without exudates or hemorrhage  Neuro: Detailed Neurologic Exam  Speech:    Speech is normal; fluent and spontaneous with normal comprehension.  Cognition:  MMSE - Mini Mental State Exam 03/05/2016  Orientation to time 4  Orientation to Place 5  Registration 3  Attention/ Calculation 5  Recall 3  Language- name 2 objects 2  Language- repeat 1  Language- follow 3 step command 3  Language- read & follow direction 1  Write a sentence 1  Copy design 1  Total score 29      The patient is oriented to person, place, and time;     recent and remote memory intact;     language fluent;     normal attention, concentration,  fund of knowledge Cranial Nerves:    The pupils are equal, round, and reactive to light. The fundi are normal and spontaneous venous pulsations are present. Visual fields are full to  finger confrontation. Extraocular movements are intact. Trigeminal sensation is intact and the muscles of mastication are normal. The face is symmetric. The palate elevates in the midline. Hearing intact. Voice is normal. Shoulder shrug is normal. The tongue has normal motion without fasciculations.   Coordination:    Normal finger to nose and heel to shin. Normal rapid alternating movements.   Gait:    Heel-toe and tandem gait are normal.   Motor Observation:    No asymmetry, no atrophy, and no involuntary movements noted. Tone:    Normal muscle tone.    Posture:    Posture is normal. normal erect    Strength:    Strength is V/V in the upper and lower limbs.      Sensation: intact to LT     Reflex Exam:  DTR's:    Deep tendon reflexes in the upper and lower extremities are normal bilaterally.   Toes:    The toes are downgoing bilaterally.   Clonus:    Clonus is absent.     Assessment/Plan:  6 78 year old male with past episode of Transient Global amnesia with another recent episode of alteration of awareness, MRI showed cerebellar strokes which appear embolic Needs a thorough workup for embolic causes of stroke.  - MRI brain with cerebellar strokes - CTA head and neck - 30 day holter monitor - echocardiogram - if negative will refer for loop recorder. - EEG in the past normal, may consider 72-hour  - B12 was 138, there is literature of acute confusional episodes with B12 deficiency and this is possible as well for his prior TGA however he has been getting injections of B12 regularly.  - Discussed driving restrictions today. Per [facility] law he may be restricted for 6 months until event free. - Stop ASA start Plavix  Orders Placed This Encounter  Procedures  . CT ANGIO HEAD W OR WO CONTRAST  . CT ANGIO NECK W OR WO CONTRAST  . Cardiac event monitor  . ECHOCARDIOGRAM COMPLETE BUBBLE STUDY   Meds ordered this encounter  Medications  . clopidogrel (PLAVIX)  75 MG tablet    Sig: Take 1 tablet (75 mg total) by mouth daily.    Dispense:  30 tablet    Refill:  [place], MD  [facility] [address] [place], [place] [address]  Phone [phone] Fax [phone]

## 2019-09-22 NOTE — Telephone Encounter (Signed)
Called pt this morning and LVM asking for call back to update chart. Pt returned my call. Confirmed pt using 2 identifiers. Pt provided updates to chart. VS updated. He understands Dr. Jaynee Eagles will call him when ready for VV. He verbalized appreciation.

## 2019-09-23 ENCOUNTER — Other Ambulatory Visit: Payer: Self-pay

## 2019-09-23 ENCOUNTER — Encounter: Payer: Self-pay | Admitting: Neurology

## 2019-09-23 ENCOUNTER — Telehealth: Payer: Self-pay | Admitting: Neurology

## 2019-09-23 NOTE — Patient Instructions (Signed)

## 2019-09-23 NOTE — Telephone Encounter (Signed)
Do you mind calling this patient and scheduling f/u with me in 3 months please? thanks

## 2019-09-27 NOTE — Telephone Encounter (Signed)
I called patient and LVM asking patient to call us back to schedule 3 month follow-up.

## 2019-10-02 ENCOUNTER — Encounter (INDEPENDENT_AMBULATORY_CARE_PROVIDER_SITE_OTHER): Payer: Medicare Other

## 2019-10-02 ENCOUNTER — Encounter: Payer: Self-pay | Admitting: Interventional Cardiology

## 2019-10-02 DIAGNOSIS — I63443 Cerebral infarction due to embolism of bilateral cerebellar arteries: Secondary | ICD-10-CM

## 2019-10-02 DIAGNOSIS — R002 Palpitations: Secondary | ICD-10-CM

## 2019-10-04 ENCOUNTER — Other Ambulatory Visit: Payer: Self-pay | Admitting: Neurology

## 2019-10-06 ENCOUNTER — Other Ambulatory Visit: Payer: Self-pay

## 2019-10-06 ENCOUNTER — Ambulatory Visit
Admission: RE | Admit: 2019-10-06 | Discharge: 2019-10-06 | Disposition: A | Discharge status: Home / Self Care | Payer: Medicare Other | Source: Ambulatory Visit | Attending: Neurology | Admitting: Neurology

## 2019-10-06 DIAGNOSIS — I63443 Cerebral infarction due to embolism of bilateral cerebellar arteries: Secondary | ICD-10-CM

## 2019-10-06 DIAGNOSIS — R002 Palpitations: Secondary | ICD-10-CM

## 2019-10-06 DIAGNOSIS — I6521 Occlusion and stenosis of right carotid artery: Secondary | ICD-10-CM | POA: Diagnosis not present

## 2019-10-06 MED ORDER — IOPAMIDOL (ISOVUE-370) INJECTION 76%
75.0000 mL | Freq: Once | INTRAVENOUS | Status: AC | PRN
  Administered 2019-10-06: 75 mL via INTRAVENOUS

## 2019-10-13 ENCOUNTER — Telehealth: Payer: Self-pay | Admitting: *Deleted

## 2019-10-13 NOTE — Telephone Encounter (Signed)
I called the pt & LVM (ok per DPR) advising pt of his CT-A neck results in detail per Dr. Jaynee Eagles. I asked for a call back or a mychart message letting us know if he's agreeable and we can place the referral to vascular surgery.

## 2019-10-13 NOTE — Telephone Encounter (Signed)
-----   Message from Melvenia Beam, MD sent at 10/11/2019  4:46 PM EST ----- There is nothing in the blood vessels that could explain the strokes in the back of his  head. However there is an unrelated problem of 65% blockage in one of the carotid arteries and I think he  should see vascular surgery for further evaluation. Let patient know and I can place the referral thanks!

## 2019-10-14 ENCOUNTER — Encounter: Payer: Self-pay | Admitting: *Deleted

## 2019-10-14 ENCOUNTER — Other Ambulatory Visit: Payer: Self-pay | Admitting: Neurology

## 2019-10-14 DIAGNOSIS — I6529 Occlusion and stenosis of unspecified carotid artery: Secondary | ICD-10-CM

## 2019-10-14 NOTE — Telephone Encounter (Signed)
Pt has called back in response to message from [employer] on 01-06.  Pt is very much agreeable in moving forward.

## 2019-10-14 NOTE — Telephone Encounter (Signed)
Updated pt in mychart.

## 2019-10-14 NOTE — Telephone Encounter (Signed)
Referral to [place] vascular surgery placed for carotid stenosis thanks

## 2019-10-17 DIAGNOSIS — Z20822 Contact with and (suspected) exposure to covid-19: Secondary | ICD-10-CM | POA: Diagnosis not present

## 2019-10-22 ENCOUNTER — Other Ambulatory Visit: Payer: Self-pay

## 2019-10-22 ENCOUNTER — Encounter: Payer: Self-pay | Admitting: Vascular Surgery

## 2019-10-22 ENCOUNTER — Ambulatory Visit (INDEPENDENT_AMBULATORY_CARE_PROVIDER_SITE_OTHER): Payer: Medicare Other | Admitting: Vascular Surgery

## 2019-10-22 VITALS — BP 139/83 | HR 60 | Temp 98.2°F | Resp 20 | Ht 68.0 in | Wt 174.0 lb

## 2019-10-22 DIAGNOSIS — I6521 Occlusion and stenosis of right carotid artery: Secondary | ICD-10-CM

## 2019-10-22 NOTE — Progress Notes (Signed)
Patient ID: Tyler Ortega, male   DOB: [birth year], 79 y.o.   MRN: [id number]  Reason for Consult: New Patient (Initial Visit)   Referred by Colon Branch, MD  Subjective:     HPI:  Tyler Ortega is a 79 y.o. male has 2 episodes of transient amnesia where he "seemed out of it" most recently in December.  He has been evaluated by neurology was found to have cerebellar infarct on MRI which were noted to be chronic and of unknown timeframe.  He is taking Plavix also takes statin has a history of quadruple bypass several years ago at [facility].  Currently is very active and exercises frequently.  Risk factors include hypertension, hyperlipidemia for vascular disease.  Does have a previous duplex of his carotid arteries which did not show any significant stenosis a few years back and more recently CT scan which demonstrates greater than 60% stenosis in the right carotid bulb.  He denies any timing of knowing of stroke TIA or amaurosis.  Past Medical History:  Diagnosis Date  . Arthritis    osteoarthritis -hips, back pain tx with Prednisone-last dose 09-04-15.  Marland Kitchen CAD (coronary artery disease)    MI 2011, seen @ [name], sees Public librarian (once a year)  . Hyperlipidemia   . Hypertension   . Skin cancer    R calf and the ear (?side)  . Transfusion history    2nd Hip surgery   Family History  Problem Relation Age of Onset  . Coronary artery disease Neg Hx   . Diabetes Neg Hx   . Colon cancer Neg Hx   . Prostate cancer Neg Hx   . Seizures Neg Hx   . Multiple sclerosis Neg Hx   . Migraines Neg Hx   . Dementia Neg Hx    Past Surgical History:  Procedure Laterality Date  . COLONOSCOPY  05/29/11   Normal  . CORONARY ARTERY BYPASS GRAFT     05-2010-x4 vessel -[place] Bilateral 2019   cataract  . HIP SURGERY Left    x3 - L hip (initial hip replacement bleed)  . INGUINAL HERNIA REPAIR Left   . TOTAL HIP ARTHROPLASTY Right 09/12/2015   Procedure: RIGHT TOTAL HIP  ARTHROPLASTY ANTERIOR APPROACH;  Surgeon: Paralee Cancel, MD;  Location: [facility];  Service: Orthopedics;  Laterality: Right;  Marland Kitchen VASECTOMY      Short Social History:  Social History   Tobacco Use  . Smoking status: Never Smoker  . Smokeless tobacco: Never Used  Substance Use Topics  . Alcohol use: Yes    Comment: rarely, last 6 months ago    No Known Allergies  Current Outpatient Medications  Medication Sig Dispense Refill  . atorvastatin (LIPITOR) 80 MG tablet Take 80 mg by mouth daily.    . cholecalciferol (VITAMIN D3) 25 MCG (1000 UT) tablet Take 1,000 Units by mouth daily.    . clopidogrel (PLAVIX) 75 MG tablet Take 1 tablet (75 mg total) by mouth daily. 30 tablet 11  . Multiple Vitamin (MULTIVITAMIN PO) Take 1 tablet by mouth daily.    . nitroGLYCERIN (NITROSTAT) 0.4 MG SL tablet SMARTSIG:1 Tablet(s) Sublingual As Needed    . vitamin B-12 (CYANOCOBALAMIN) 1000 MCG tablet Take 5,000 mcg by mouth daily.    Marland Kitchen aspirin EC 81 MG tablet Take 81 mg by mouth daily.     No current facility-administered medications for this visit.    Review of Systems  Constitutional:  Constitutional  negative. HENT: HENT negative.  Eyes: Eyes negative.  Respiratory: Respiratory negative.  Cardiovascular: Cardiovascular negative.  GI: Gastrointestinal negative.  Musculoskeletal: Musculoskeletal negative.  Skin: Skin negative.  Neurological:       Transient amnesia as above Hematologic: Hematologic/lymphatic negative.  Psychiatric: Psychiatric negative.        Objective:  Objective   Vitals:   10/22/19 1327 10/22/19 1330  BP: 140/76 139/83  Pulse: 60   Resp: 20   Temp: 98.2 F (36.8 C)   SpO2: 98%   Weight: 174 lb (78.9 kg)   Height: 5\' 8"  (1.727 m)    Body mass index is 26.46 kg/m.  Physical Exam HENT:     Head: Normocephalic.     Nose: Nose normal.     Mouth/Throat:     Mouth: Mucous membranes are moist.  Eyes:     Pupils: Pupils are equal, round, and reactive to light.    Neck:     Vascular: No carotid bruit.  Cardiovascular:     Rate and Rhythm: Normal rate.     Heart sounds: Normal heart sounds.  Pulmonary:     Effort: Pulmonary effort is normal.     Breath sounds: Normal breath sounds.  Abdominal:     General: Abdomen is flat.     Palpations: Abdomen is soft.  Musculoskeletal:        General: No swelling. Normal range of motion.     Cervical back: Normal range of motion.  Skin:    General: Skin is warm and dry.     Capillary Refill: Capillary refill takes less than 2 seconds.  Neurological:     General: No focal deficit present.     Mental Status: He is alert.  Psychiatric:        Mood and Affect: Mood normal.        Behavior: Behavior normal.        Thought Content: Thought content normal.        Judgment: Judgment normal.     Data: I reviewed the patient's CT scan with him which demonstrates 65% atheromatous stenosis at the right ICA a bulb with calcification surrounding what appears to be soft plaque.     Assessment/Plan:    79 year old male presents for evaluation of 65% right ICA stenosis with minimal left stenosis.  He has 2 recent episodes of what sound to be transient amnesia has been evaluated by neurology was found to have cerebellar strokes which appeared to be embolic in nature.  Does not appear these were secondary to his blockage in his right carotid.  He is on Plavix and a statin drug.  I will follow up in 6 months with repeat carotid duplex.  Certainly if he has further symptoms may require carotid endarterectomy versus stenting in the future.      Waynetta Sandy MD [facility]

## 2019-10-26 ENCOUNTER — Other Ambulatory Visit: Payer: Self-pay | Admitting: *Deleted

## 2019-10-26 DIAGNOSIS — I6521 Occlusion and stenosis of right carotid artery: Secondary | ICD-10-CM

## 2019-10-28 ENCOUNTER — Telehealth: Payer: Self-pay | Admitting: Nurse Practitioner

## 2019-10-28 NOTE — Telephone Encounter (Signed)
Monitor report received with documentation of heart rate of 30 bpm that occurred at 0353. Reviewed by Dr. Irish Lack, no changes.

## 2019-11-01 ENCOUNTER — Other Ambulatory Visit: Payer: Self-pay

## 2019-11-01 ENCOUNTER — Ambulatory Visit (HOSPITAL_COMMUNITY): Payer: Medicare Other | Attending: Cardiology

## 2019-11-01 DIAGNOSIS — R002 Palpitations: Secondary | ICD-10-CM | POA: Diagnosis not present

## 2019-11-01 DIAGNOSIS — I63443 Cerebral infarction due to embolism of bilateral cerebellar arteries: Secondary | ICD-10-CM | POA: Diagnosis not present

## 2019-11-01 MED ORDER — SODIUM CHLORIDE 3 % IV BOLUS: 20.0000 mL | Freq: Once | INTRAVENOUS | Status: DC

## 2019-11-02 ENCOUNTER — Telehealth: Payer: Self-pay | Admitting: *Deleted

## 2019-11-02 NOTE — Telephone Encounter (Signed)
Pt aware of echo showing PFO. Pt stated he has an appt with Dr. Burt Knack. He verbalized appreciation for the call.

## 2019-11-02 NOTE — Telephone Encounter (Signed)
-----   Message from Melvenia Beam, MD sent at 11/02/2019  9:15 AM EST ----- Would you call patient and let him know that the echo showed a PFO. This can be a reason for strokes and I am referring him to Dr. Sherren Mocha in Cardiology thanks

## 2019-11-02 NOTE — Progress Notes (Signed)
Would you call patient and let him know that the echo showed a PFO. This can be a reason for strokes and I am referring him to Dr. Sherren Mocha in Cardiology thanks

## 2019-11-11 ENCOUNTER — Institutional Professional Consult (permissible substitution): Payer: Medicare Other | Admitting: Cardiovascular Disease

## 2019-11-15 ENCOUNTER — Other Ambulatory Visit (HOSPITAL_COMMUNITY)
Admission: RE | Admit: 2019-11-15 | Discharge: 2019-11-15 | Disposition: A | Discharge status: Home / Self Care | Payer: Medicare Other | Source: Ambulatory Visit | Attending: Cardiovascular Disease | Admitting: Cardiovascular Disease

## 2019-11-15 ENCOUNTER — Other Ambulatory Visit: Payer: Self-pay

## 2019-11-15 ENCOUNTER — Ambulatory Visit: Payer: Medicare Other | Admitting: Cardiovascular Disease

## 2019-11-15 ENCOUNTER — Encounter: Payer: Self-pay | Admitting: Cardiovascular Disease

## 2019-11-15 VITALS — BP 124/70 | HR 65 | Ht 68.0 in | Wt 176.8 lb

## 2019-11-15 DIAGNOSIS — Z01812 Encounter for preprocedural laboratory examination: Secondary | ICD-10-CM | POA: Diagnosis not present

## 2019-11-15 DIAGNOSIS — Q211 Atrial septal defect: Secondary | ICD-10-CM

## 2019-11-15 DIAGNOSIS — Z20822 Contact with and (suspected) exposure to covid-19: Secondary | ICD-10-CM | POA: Diagnosis not present

## 2019-11-15 DIAGNOSIS — Q2112 Patent foramen ovale: Secondary | ICD-10-CM

## 2019-11-15 LAB — BASIC METABOLIC PANEL
BUN/Creatinine Ratio: 16 (ref 10–24)
BUN: 20 mg/dL (ref 8–27)
CO2: 22 mmol/L (ref 20–29)
Calcium: 9.4 mg/dL (ref 8.6–10.2)
Chloride: 104 mmol/L (ref 96–106)
Creatinine, Ser: 1.25 mg/dL (ref 0.76–1.27)
GFR calc Af Amer: 64 mL/min/{1.73_m2} (ref >59)
GFR calc non Af Amer: 55 mL/min/{1.73_m2} — ABNORMAL LOW (ref >59)
Glucose: 99 mg/dL (ref 65–99)
Potassium: 4.8 mmol/L (ref 3.5–5.2)
Sodium: 141 mmol/L (ref 134–144)

## 2019-11-15 LAB — CBC WITH DIFFERENTIAL/PLATELET
Basophils Absolute: 0.1 10*3/uL (ref 0.0–0.2)
Basos: 1 %
EOS (ABSOLUTE): 0.1 10*3/uL (ref 0.0–0.4)
Eos: 2 %
Hematocrit: 47.6 % (ref 37.5–51.0)
Hemoglobin: 15.3 g/dL (ref 13.0–17.7)
Immature Grans (Abs): 0 10*3/uL (ref 0.0–0.1)
Immature Granulocytes: 0 %
Lymphocytes Absolute: 1.4 10*3/uL (ref 0.7–3.1)
Lymphs: 22 %
MCH: 31.2 pg (ref 26.6–33.0)
MCHC: 32.1 g/dL (ref 31.5–35.7)
MCV: 97 fL (ref 79–97)
Monocytes Absolute: 0.5 10*3/uL (ref 0.1–0.9)
Monocytes: 8 %
Neutrophils Absolute: 4.2 10*3/uL (ref 1.4–7.0)
Neutrophils: 67 %
Platelets: 186 10*3/uL (ref 150–450)
RBC: 4.91 x10E6/uL (ref 4.14–5.80)
RDW: 12.3 % (ref 11.6–15.4)
WBC: 6.3 10*3/uL (ref 3.4–10.8)

## 2019-11-15 LAB — SARS CORONAVIRUS 2 (TAT 6-24 HRS): SARS Coronavirus 2: NEGATIVE

## 2019-11-15 NOTE — Progress Notes (Signed)
[facility]   STRUCTURAL HEART TEAM  Date:  11/15/2019   ID:  Tyler Ortega, DOB 09-22-1941, MRN [id number]  PCP:  Colon Branch, MD   Chief Complaint  Patient presents with  . pfo     HISTORY OF PRESENT ILLNESS: Tyler Ortega is a 79 y.o. male who presents for evaluation of PFO, referred by [name] for evaluation of PFO management.  The patient is here alone today. His wife is teleconferenced in on the telephone. He has extensive vascular disease with hx of CABG, PCI, and moderate carotid stenosis.   He first had an episode of transient amnesia about 3 years ago when he went to the gym. He returned home and didn't remember being there. About 2 months ago, he woke up and didn't remember what he was supposed to do that day. He reported significant confusion. All of this resolved over about 20-30 minutes. He later sought neurology evaluation with [name] and underwent testing that included an echocardiogram.  The study demonstrated cardiac function with LVEF 55%.  There was no significant valvular disease identified. There is a bubble study completed, demonstrating bubbles in the left heart within 3 beats, suggestive of a PFO. An event monitor showed no evidence of atrial fibrillation or flutter. A CTA of the neck showed a 65% atheromatous stenosis of the right ICA bulb. An MRI of the brain demonstrated new small cerebellar infarcts.  The patient is felt to have evidence of embolic appearing infarcts in the cerebellum and he is now referred for consideration of PFO closure.  He has undergone formal vascular surgery evaluation regarding his carotid stenosis and it was felt that medical therapy is most appropriate.  The patient is physically fit.  He exercises almost every day and does a lot of weightlifting.  He denies any exertional symptoms.  He specifically denies chest pain, chest pressure, or shortness of breath.  He has had no further neurologic symptoms.  He is compliant with  his medications.  He notes very good control of his blood pressure.   The patient denies any history of nickel allergy or reaction.  Past Medical History:  Diagnosis Date  . Arthritis    osteoarthritis -hips, back pain tx with Prednisone-last dose 09-04-15.  Marland Kitchen CAD (coronary artery disease)    MI 2011, seen @ [name], sees Public librarian (once a year)  . Hyperlipidemia   . Hypertension   . Skin cancer    R calf and the ear (?side)  . Transfusion history    2nd Hip surgery    Current Outpatient Medications  Medication Sig Dispense Refill  . atorvastatin (LIPITOR) 80 MG tablet Take 80 mg by mouth daily.    . cholecalciferol (VITAMIN D3) 25 MCG (1000 UT) tablet Take 1,000 Units by mouth daily.    . clopidogrel (PLAVIX) 75 MG tablet Take 1 tablet (75 mg total) by mouth daily. 30 tablet 11  . Multiple Vitamin (MULTIVITAMIN PO) Take 1 tablet by mouth daily.    . nitroGLYCERIN (NITROSTAT) 0.4 MG SL tablet SMARTSIG:1 Tablet(s) Sublingual As Needed    . vitamin B-12 (CYANOCOBALAMIN) 1000 MCG tablet Take 5,000 mcg by mouth daily.     No current facility-administered medications for this visit.   Facility-Administered Medications Ordered in Other Visits  Medication Dose Route Frequency Provider Last Rate Last Admin  . sodium chloride 3% (hypertonic) IV bolus 20 mL  20 mL Intravenous Once Melvenia Beam, MD  ALLERGIES:   Patient has no known allergies.   SOCIAL HISTORY:  The patient  reports that he has never smoked. He has never used smokeless tobacco. He reports current alcohol use. He reports that he does not use drugs.   FAMILY HISTORY:  The patient's family history is not on file.   REVIEW OF SYSTEMS:  .   All other systems are reviewed and negative.   PHYSICAL EXAM: VS:  BP 124/70   Pulse 65   Ht 5\' 8"  (1.727 m)   Wt 176 lb 12.8 oz (80.2 kg)   BMI 26.88 kg/m  , BMI Body mass index is 26.88 kg/m. GEN: Well nourished, well developed, in no acute distress HEENT:  normal Neck: No JVD. carotids 2+ without bruits or masses Cardiac: The heart is RRR without murmurs, rubs, or gallops. No edema. Pedal pulses 2+ = bilaterally  Respiratory:  clear to auscultation bilaterally GI: soft, nontender, nondistended, + BS MS: no deformity or atrophy Skin: warm and dry, no rash Neuro:  Strength and sensation are intact Psych: euthymic mood, full affect  RECENT LABS: 04/22/2019: ALT 15; BUN 23; Creatinine, Ser 1.17; Hemoglobin 14.6; Platelets 166.0; Potassium 4.7; Sodium 141  04/22/2019: Cholesterol 126; HDL 56.40; LDL Cholesterol 60; Total CHOL/HDL Ratio 2; Triglycerides 48.0; VLDL 9.6   CrCl cannot be calculated (Patient's most recent lab result is older than the maximum 21 days allowed.).   Wt Readings from Last 3 Encounters:  11/15/19 176 lb 12.8 oz (80.2 kg)  10/22/19 174 lb (78.9 kg)  09/22/19 168 lb 6.4 oz (76.4 kg)     PERTINENT STUDIES: Echo:  IMPRESSIONS    1. Left ventricular ejection fraction, by visual estimation, is 55%. The  left ventricle has normal function. There is no left ventricular  hypertrophy.  2. Left ventricular diastolic parameters are consistent with Grade I  diastolic dysfunction (impaired relaxation).  3. The left ventricle demonstrates regional wall motion abnormalities.  There was septal-lateral dyssynchrony noted, suggesting LBBB.  4. Global right ventricle has mildly reduced systolic function.The right  ventricular size is normal. No increase in right ventricular wall  thickness.  5. Bubble study was positive, suggesting the presence of a PFO.  6. Left atrial size was mildly dilated.  7. Right atrial size was mildly dilated.  8. The mitral valve is normal in structure. Trivial mitral valve  regurgitation. No evidence of mitral stenosis.  9. The tricuspid valve is normal in structure. Tricuspid valve  regurgitation is trivial.  10. The aortic valve is tricuspid. Aortic valve regurgitation is trivial.  Mild  aortic valve sclerosis without stenosis.  11. There is mild dilatation of the ascending aorta measuring 39 mm.  12. Trivial pericardial effusion is present.  13. The inferior vena cava is normal in size with greater than 50%  respiratory variability, suggesting right atrial pressure of 3 mmHg.  14. The tricuspid regurgitant velocity is 2.13 m/s, and with an assumed  right atrial pressure of 3 mmHg, the estimated right ventricular systolic  pressure is normal at 21.1 mmHg.   FINDINGS  Left Ventricle: Left ventricular ejection fraction, by visual estimation,  is 55%. The left ventricle has normal function. The left ventricle  demonstrates regional wall motion abnormalities. The left ventricular  internal cavity size was the left  ventricle is normal in size. There is no left ventricular hypertrophy.  Left ventricular diastolic parameters are consistent with Grade I  diastolic dysfunction (impaired relaxation).   Right Ventricle: The right ventricular size is  normal. No increase in  right ventricular wall thickness. Global RV systolic function is has  mildly reduced systolic function. The tricuspid regurgitant velocity is  2.13 m/s, and with an assumed right atrial  pressure of 3 mmHg, the estimated right ventricular systolic pressure is  normal at 21.1 mmHg.   Left Atrium: Left atrial size was mildly dilated.   Right Atrium: Right atrial size was mildly dilated   Pericardium: Trivial pericardial effusion is present.   Mitral Valve: The mitral valve is normal in structure. Trivial mitral  valve regurgitation. No evidence of mitral valve stenosis by observation.   Tricuspid Valve: The tricuspid valve is normal in structure. Tricuspid  valve regurgitation is trivial.   Aortic Valve: The aortic valve is tricuspid. Aortic valve regurgitation is  trivial. Mild aortic valve sclerosis is present, with no evidence of  aortic valve stenosis.   Pulmonic Valve: The pulmonic valve was  normal in structure. Pulmonic valve  regurgitation is trivial. Pulmonic regurgitation is trivial.   Aorta: Aortic dilatation noted. There is mild dilatation of the ascending  aorta measuring 39 mm.   Venous: The inferior vena cava is normal in size with greater than 50%  respiratory variability, suggesting right atrial pressure of 3 mmHg.   IAS/Shunts: Positive bubble study.     LEFT VENTRICLE  PLAX 2D  LVIDd:     4.40 cm Diastology  LVIDs:     3.10 cm LV e' lateral:  9.14 cm/s  LV PW:     1.00 cm LV E/e' lateral: 7.8  LV IVS:    1.10 cm LV e' medial:  5.98 cm/s  LVOT diam:   2.00 cm LV E/e' medial: 11.9  LV SV:     50 ml  LV SV Index:  25.40  LVOT Area:   3.14 cm     RIGHT VENTRICLE      IVC  RV S prime:   8.16 cm/s IVC diam: 0.70 cm  TAPSE (M-mode): 1.3 cm  RVSP:      21.1 mmHg   LEFT ATRIUM       Index    RIGHT ATRIUM      Index  LA diam:    4.00 cm 2.08 cm/m RA Pressure: 3.00 mmHg  LA Vol (A2C):  36.4 ml 18.90 ml/m RA Area:   19.00 cm  LA Vol (A4C):  60.4 ml 31.36 ml/m RA Volume:  53.20 ml 27.62 ml/m  LA Biplane Vol: 48.1 ml 24.97 ml/m  AORTIC VALVE  LVOT Vmax:  88.80 cm/s  LVOT Vmean: 60.400 cm/s  LVOT VTI:  0.220 m    AORTA  Ao Root diam: 3.60 cm  Ao Asc diam: 3.90 cm   MITRAL VALVE            TRICUSPID VALVE  MV Area (PHT):           TR Peak grad:  18.1 mmHg                   TR Vmax:    213.00 cm/s  MV Decel Time: 246 msec       Estimated RAP: 3.00 mmHg  MV E velocity: 71.10 cm/s 103 cm/s RVSP:      21.1 mmHg  MV A velocity: 79.30 cm/s 70.3 cm/s  MV E/A ratio: 0.90    1.5    SHUNTS                   Systemic VTI: 0.22 m  Systemic Diam: 2.00 cm   MRI Brain: FINDINGS: Brain: There is no evidence of acute infarct, intracranial hemorrhage, mass,  midline shift, or extra-axial fluid collection. T2 hyperintensities in the cerebral white matter bilaterally are nonspecific but compatible with mild-to-moderate chronic small vessel ischemic disease, similar to the prior study. Subcentimeter chronic bilateral cerebellar infarcts are new. Mild cerebral atrophy is without lobar predominance and not considered abnormal for age.  Vascular: Major intracranial vascular flow voids are preserved.  Skull and upper cervical spine: Unremarkable bone marrow signal.  Sinuses/Orbits: Bilateral cataract extraction. Clear paranasal sinuses. Trace bilateral mastoid fluid.  Other: None.  IMPRESSION: 1. No acute intracranial abnormality. 2. Mild-to-moderate chronic small vessel ischemic disease. 3. New chronic small cerebellar infarcts.  Carotid US: Final Interpretation:  Right Carotid: Velocities in the right ICA are consistent with a 1-39%  stenosis.   Left Carotid: There is no evidence of stenosis in the left ICA. The  extracranial        vessels were near-normal with only minimal wall thickening  or        plaque.   Vertebrals: Bilateral vertebral arteries demonstrate antegrade flow.  Subclavians: Normal flow hemodynamics were seen in bilateral subclavian        arteries.   CTA neck: CTA NECK FINDINGS  Aortic arch: Partial coverage of the postoperative aorta is unremarkable. Three vessel branching.  Right carotid system: Prominent mixed density plaque on the posterior wall of the right ICA bulb with 65% stenosis based on axial slices.  Left carotid system: Left ICA is smaller than the right in the setting of hypoplastic left A1 segment. No significant atheromatous changes, stenosis, or ulceration.  Vertebral arteries: Proximal subclavian atherosclerosis without flow limiting stenosis. Mild dominance of the left vertebral artery. There are areas of mild vertebral narrowing due to vertebral  spurs. No ulceration or flow limiting atheromatous stenosis.  Skeleton: Severe disc and facet degeneration multilevel listhesis and foraminal impingement.  Other neck: Negative  Upper chest: Negative  Review of the MIP images confirms the above findings  CTA HEAD FINDINGS  Anterior circulation: Atherosclerotic plaque on the carotid siphons without flow limiting stenosis or ulceration. Markedly hypoplastic left A1 segment. No proximal flow limiting stenosis, branch occlusion, beading, or aneurysm.  Posterior circulation: Left vertebral artery dominance. Minimal atheromatous plaque on the left V4 segment. The vertebral and basilar arteries are widely patent. Robust flow in both posterior cerebral arteries.  Venous sinuses: Patent  Anatomic variants: As above  Review of the MIP images confirms the above findings  IMPRESSION: 1. 65% atheromatous stenosis at the right ICA bulb. 2. Mild intracranial atherosclerosis. 3. No posterior circulation finding to explain interval cerebellar Infarction.  ASSESSMENT AND PLAN: 1.  Probable PFO with suggestion of interatrial septal communication by positive echo bubble study and patient with evidence of embolic stroke.  Data is personally reviewed.  I showed the patient his echo images while I was reviewing his study.  He does have bubbles entering the left heart within 3 heartbeats, suggestive of an atrial septal communication.  There is no clear evidence of interatrial septal aneurysm on his surface echo study.  The patient is counseled about the association of PFO and cryptogenic stroke. Available clinical trial data is reviewed, specifically those trials comparing transcatheter PFO closure and medical therapy with antiplatelet drugs. The patient understands the potential benefit of PFO closure with respect to secondary stroke reduction compared with medical therapy alone. Specific risks of transcatheter PFO closure are reviewed  with the  patient.  The discussion of PFO closure occurs in the context of the patient's age and concomitant cardiovascular problems.  While he is very active and physically fit, he does have significant vascular disease.  It is unclear whether his neurologic events are PFO-related or related to other issues such as atheroembolism.  The patient is compliant with an excellent medical regimen and certainly is secondary risk reduction is optimal at this time.  I think it is reasonable to proceed with a transesophageal echocardiogram to help with decision-making regarding transcatheter PFO closure.  If he has a very small PFO, I would be inclined to treat him medically.  However, if he has evidence of a moderate to large PFO or certainly an associated atrial septal aneurysm, this would be suggestive of higher risk PFO anatomy and transcatheter closure could be considered.  The PFO closure devices are demonstrated to the patient today and I reviewed the procedural steps in detail.  He seems inclined to proceed with PFO closure pending the results of his transesophageal echocardiogram.  I reviewed the risks, indications, and rationale for pursuing a transesophageal echocardiogram.  He agrees to proceed.  We will touch base with him once his transesophageal echocardiogram is completed and reviewed.  In the meantime, he will continue on his regimen which includes aspirin, clopidogrel, and a high intensity statin drug.  We discussed the fact that he should avoid heavy lifting, but he can continue with aerobic exercise and high repetitions/lighter weight lifting.  Sherren Mocha 11/15/2019 11:32 AM

## 2019-11-15 NOTE — Patient Instructions (Addendum)
Labs: Today! BMET, CBC  COVID SCREENING INFORMATION (TODAY, 11/15/2019): [facility] (old [facility]) [address] Stay in the RIGHT lane and proceed under the brick awning and tell them you are there for pre-procedure testing Do NOT bring any pets with you to the testing site   PROCEDURE INSTRUCTIONS (11/18/2019): You are scheduled for a TEE on Thursday, 11/18/2019 at 0930.  1. Please arrive at the [facility] (Main Entrance A) at [facility]: [address] [place], [place] [address] at: 8:30AM. Free valet parking service is available. You are allowed ONE visitor in the waiting room during your procedure. Both you and your visitor must wear masks.  Special note: Every effort is made to have your procedure done on time. Please understand that emergencies sometimes delay scheduled procedures.  2. Diet: Do not eat or drink anything after midnight except medications with sips of water.  3. Medications: Take as directed with sips of water.  4. Bring a current list of your medications and current insurance cards.  5. You MUST have a responsible person to drive you home.  6. Please wear clothes that are easy to get on and off and wear slip-on shoes.  Thank you for letting us care for you!

## 2019-11-15 NOTE — H&P (View-Only) (Signed)
[facility]   STRUCTURAL HEART TEAM  Date:  11/15/2019   ID:  Tyler Ortega, DOB 06/18/1941, MRN [id number]  PCP:  Colon Branch, MD   Chief Complaint  Patient presents with  . pfo     HISTORY OF PRESENT ILLNESS: Tyler Ortega is a 79 y.o. male who presents for evaluation of PFO, referred by [name] for evaluation of PFO management.  The patient is here alone today. His wife is teleconferenced in on the telephone. He has extensive vascular disease with hx of CABG, PCI, and moderate carotid stenosis.   He first had an episode of transient amnesia about 3 years ago when he went to the gym. He returned home and didn't remember being there. About 2 months ago, he woke up and didn't remember what he was supposed to do that day. He reported significant confusion. All of this resolved over about 20-30 minutes. He later sought neurology evaluation with [name] and underwent testing that included an echocardiogram.  The study demonstrated cardiac function with LVEF 55%.  There was no significant valvular disease identified. There is a bubble study completed, demonstrating bubbles in the left heart within 3 beats, suggestive of a PFO. An event monitor showed no evidence of atrial fibrillation or flutter. A CTA of the neck showed a 65% atheromatous stenosis of the right ICA bulb. An MRI of the brain demonstrated new small cerebellar infarcts.  The patient is felt to have evidence of embolic appearing infarcts in the cerebellum and he is now referred for consideration of PFO closure.  He has undergone formal vascular surgery evaluation regarding his carotid stenosis and it was felt that medical therapy is most appropriate.  The patient is physically fit.  He exercises almost every day and does a lot of weightlifting.  He denies any exertional symptoms.  He specifically denies chest pain, chest pressure, or shortness of breath.  He has had no further neurologic symptoms.  He is compliant with  his medications.  He notes very good control of his blood pressure.   The patient denies any history of nickel allergy or reaction.  Past Medical History:  Diagnosis Date  . Arthritis    osteoarthritis -hips, back pain tx with Prednisone-last dose 09-04-15.  Marland Kitchen CAD (coronary artery disease)    MI 2011, seen @ [name], sees Public librarian (once a year)  . Hyperlipidemia   . Hypertension   . Skin cancer    R calf and the ear (?side)  . Transfusion history    2nd Hip surgery    Current Outpatient Medications  Medication Sig Dispense Refill  . atorvastatin (LIPITOR) 80 MG tablet Take 80 mg by mouth daily.    . cholecalciferol (VITAMIN D3) 25 MCG (1000 UT) tablet Take 1,000 Units by mouth daily.    . clopidogrel (PLAVIX) 75 MG tablet Take 1 tablet (75 mg total) by mouth daily. 30 tablet 11  . Multiple Vitamin (MULTIVITAMIN PO) Take 1 tablet by mouth daily.    . nitroGLYCERIN (NITROSTAT) 0.4 MG SL tablet SMARTSIG:1 Tablet(s) Sublingual As Needed    . vitamin B-12 (CYANOCOBALAMIN) 1000 MCG tablet Take 5,000 mcg by mouth daily.     No current facility-administered medications for this visit.   Facility-Administered Medications Ordered in Other Visits  Medication Dose Route Frequency Provider Last Rate Last Admin  . sodium chloride 3% (hypertonic) IV bolus 20 mL  20 mL Intravenous Once Melvenia Beam, MD  ALLERGIES:   Patient has no known allergies.   SOCIAL HISTORY:  The patient  reports that he has never smoked. He has never used smokeless tobacco. He reports current alcohol use. He reports that he does not use drugs.   FAMILY HISTORY:  The patient's family history is not on file.   REVIEW OF SYSTEMS:  .   All other systems are reviewed and negative.   PHYSICAL EXAM: VS:  BP 124/70   Pulse 65   Ht 5\' 8"  (1.727 m)   Wt 176 lb 12.8 oz (80.2 kg)   BMI 26.88 kg/m  , BMI Body mass index is 26.88 kg/m. GEN: Well nourished, well developed, in no acute distress HEENT:  normal Neck: No JVD. carotids 2+ without bruits or masses Cardiac: The heart is RRR without murmurs, rubs, or gallops. No edema. Pedal pulses 2+ = bilaterally  Respiratory:  clear to auscultation bilaterally GI: soft, nontender, nondistended, + BS MS: no deformity or atrophy Skin: warm and dry, no rash Neuro:  Strength and sensation are intact Psych: euthymic mood, full affect  RECENT LABS: 04/22/2019: ALT 15; BUN 23; Creatinine, Ser 1.17; Hemoglobin 14.6; Platelets 166.0; Potassium 4.7; Sodium 141  04/22/2019: Cholesterol 126; HDL 56.40; LDL Cholesterol 60; Total CHOL/HDL Ratio 2; Triglycerides 48.0; VLDL 9.6   CrCl cannot be calculated (Patient's most recent lab result is older than the maximum 21 days allowed.).   Wt Readings from Last 3 Encounters:  11/15/19 176 lb 12.8 oz (80.2 kg)  10/22/19 174 lb (78.9 kg)  09/22/19 168 lb 6.4 oz (76.4 kg)     PERTINENT STUDIES: Echo:  IMPRESSIONS    1. Left ventricular ejection fraction, by visual estimation, is 55%. The  left ventricle has normal function. There is no left ventricular  hypertrophy.  2. Left ventricular diastolic parameters are consistent with Grade I  diastolic dysfunction (impaired relaxation).  3. The left ventricle demonstrates regional wall motion abnormalities.  There was septal-lateral dyssynchrony noted, suggesting LBBB.  4. Global right ventricle has mildly reduced systolic function.The right  ventricular size is normal. No increase in right ventricular wall  thickness.  5. Bubble study was positive, suggesting the presence of a PFO.  6. Left atrial size was mildly dilated.  7. Right atrial size was mildly dilated.  8. The mitral valve is normal in structure. Trivial mitral valve  regurgitation. No evidence of mitral stenosis.  9. The tricuspid valve is normal in structure. Tricuspid valve  regurgitation is trivial.  10. The aortic valve is tricuspid. Aortic valve regurgitation is trivial.  Mild  aortic valve sclerosis without stenosis.  11. There is mild dilatation of the ascending aorta measuring 39 mm.  12. Trivial pericardial effusion is present.  13. The inferior vena cava is normal in size with greater than 50%  respiratory variability, suggesting right atrial pressure of 3 mmHg.  14. The tricuspid regurgitant velocity is 2.13 m/s, and with an assumed  right atrial pressure of 3 mmHg, the estimated right ventricular systolic  pressure is normal at 21.1 mmHg.   FINDINGS  Left Ventricle: Left ventricular ejection fraction, by visual estimation,  is 55%. The left ventricle has normal function. The left ventricle  demonstrates regional wall motion abnormalities. The left ventricular  internal cavity size was the left  ventricle is normal in size. There is no left ventricular hypertrophy.  Left ventricular diastolic parameters are consistent with Grade I  diastolic dysfunction (impaired relaxation).   Right Ventricle: The right ventricular size is  normal. No increase in  right ventricular wall thickness. Global RV systolic function is has  mildly reduced systolic function. The tricuspid regurgitant velocity is  2.13 m/s, and with an assumed right atrial  pressure of 3 mmHg, the estimated right ventricular systolic pressure is  normal at 21.1 mmHg.   Left Atrium: Left atrial size was mildly dilated.   Right Atrium: Right atrial size was mildly dilated   Pericardium: Trivial pericardial effusion is present.   Mitral Valve: The mitral valve is normal in structure. Trivial mitral  valve regurgitation. No evidence of mitral valve stenosis by observation.   Tricuspid Valve: The tricuspid valve is normal in structure. Tricuspid  valve regurgitation is trivial.   Aortic Valve: The aortic valve is tricuspid. Aortic valve regurgitation is  trivial. Mild aortic valve sclerosis is present, with no evidence of  aortic valve stenosis.   Pulmonic Valve: The pulmonic valve was  normal in structure. Pulmonic valve  regurgitation is trivial. Pulmonic regurgitation is trivial.   Aorta: Aortic dilatation noted. There is mild dilatation of the ascending  aorta measuring 39 mm.   Venous: The inferior vena cava is normal in size with greater than 50%  respiratory variability, suggesting right atrial pressure of 3 mmHg.   IAS/Shunts: Positive bubble study.     LEFT VENTRICLE  PLAX 2D  LVIDd:     4.40 cm Diastology  LVIDs:     3.10 cm LV e' lateral:  9.14 cm/s  LV PW:     1.00 cm LV E/e' lateral: 7.8  LV IVS:    1.10 cm LV e' medial:  5.98 cm/s  LVOT diam:   2.00 cm LV E/e' medial: 11.9  LV SV:     50 ml  LV SV Index:  25.40  LVOT Area:   3.14 cm     RIGHT VENTRICLE      IVC  RV S prime:   8.16 cm/s IVC diam: 0.70 cm  TAPSE (M-mode): 1.3 cm  RVSP:      21.1 mmHg   LEFT ATRIUM       Index    RIGHT ATRIUM      Index  LA diam:    4.00 cm 2.08 cm/m RA Pressure: 3.00 mmHg  LA Vol (A2C):  36.4 ml 18.90 ml/m RA Area:   19.00 cm  LA Vol (A4C):  60.4 ml 31.36 ml/m RA Volume:  53.20 ml 27.62 ml/m  LA Biplane Vol: 48.1 ml 24.97 ml/m  AORTIC VALVE  LVOT Vmax:  88.80 cm/s  LVOT Vmean: 60.400 cm/s  LVOT VTI:  0.220 m    AORTA  Ao Root diam: 3.60 cm  Ao Asc diam: 3.90 cm   MITRAL VALVE            TRICUSPID VALVE  MV Area (PHT):           TR Peak grad:  18.1 mmHg                   TR Vmax:    213.00 cm/s  MV Decel Time: 246 msec       Estimated RAP: 3.00 mmHg  MV E velocity: 71.10 cm/s 103 cm/s RVSP:      21.1 mmHg  MV A velocity: 79.30 cm/s 70.3 cm/s  MV E/A ratio: 0.90    1.5    SHUNTS                   Systemic VTI: 0.22 m  Systemic Diam: 2.00 cm   MRI Brain: FINDINGS: Brain: There is no evidence of acute infarct, intracranial hemorrhage, mass,  midline shift, or extra-axial fluid collection. T2 hyperintensities in the cerebral white matter bilaterally are nonspecific but compatible with mild-to-moderate chronic small vessel ischemic disease, similar to the prior study. Subcentimeter chronic bilateral cerebellar infarcts are new. Mild cerebral atrophy is without lobar predominance and not considered abnormal for age.  Vascular: Major intracranial vascular flow voids are preserved.  Skull and upper cervical spine: Unremarkable bone marrow signal.  Sinuses/Orbits: Bilateral cataract extraction. Clear paranasal sinuses. Trace bilateral mastoid fluid.  Other: None.  IMPRESSION: 1. No acute intracranial abnormality. 2. Mild-to-moderate chronic small vessel ischemic disease. 3. New chronic small cerebellar infarcts.  Carotid US: Final Interpretation:  Right Carotid: Velocities in the right ICA are consistent with a 1-39%  stenosis.   Left Carotid: There is no evidence of stenosis in the left ICA. The  extracranial        vessels were near-normal with only minimal wall thickening  or        plaque.   Vertebrals: Bilateral vertebral arteries demonstrate antegrade flow.  Subclavians: Normal flow hemodynamics were seen in bilateral subclavian        arteries.   CTA neck: CTA NECK FINDINGS  Aortic arch: Partial coverage of the postoperative aorta is unremarkable. Three vessel branching.  Right carotid system: Prominent mixed density plaque on the posterior wall of the right ICA bulb with 65% stenosis based on axial slices.  Left carotid system: Left ICA is smaller than the right in the setting of hypoplastic left A1 segment. No significant atheromatous changes, stenosis, or ulceration.  Vertebral arteries: Proximal subclavian atherosclerosis without flow limiting stenosis. Mild dominance of the left vertebral artery. There are areas of mild vertebral narrowing due to vertebral  spurs. No ulceration or flow limiting atheromatous stenosis.  Skeleton: Severe disc and facet degeneration multilevel listhesis and foraminal impingement.  Other neck: Negative  Upper chest: Negative  Review of the MIP images confirms the above findings  CTA HEAD FINDINGS  Anterior circulation: Atherosclerotic plaque on the carotid siphons without flow limiting stenosis or ulceration. Markedly hypoplastic left A1 segment. No proximal flow limiting stenosis, branch occlusion, beading, or aneurysm.  Posterior circulation: Left vertebral artery dominance. Minimal atheromatous plaque on the left V4 segment. The vertebral and basilar arteries are widely patent. Robust flow in both posterior cerebral arteries.  Venous sinuses: Patent  Anatomic variants: As above  Review of the MIP images confirms the above findings  IMPRESSION: 1. 65% atheromatous stenosis at the right ICA bulb. 2. Mild intracranial atherosclerosis. 3. No posterior circulation finding to explain interval cerebellar Infarction.  ASSESSMENT AND PLAN: 1.  Probable PFO with suggestion of interatrial septal communication by positive echo bubble study and patient with evidence of embolic stroke.  Data is personally reviewed.  I showed the patient his echo images while I was reviewing his study.  He does have bubbles entering the left heart within 3 heartbeats, suggestive of an atrial septal communication.  There is no clear evidence of interatrial septal aneurysm on his surface echo study.  The patient is counseled about the association of PFO and cryptogenic stroke. Available clinical trial data is reviewed, specifically those trials comparing transcatheter PFO closure and medical therapy with antiplatelet drugs. The patient understands the potential benefit of PFO closure with respect to secondary stroke reduction compared with medical therapy alone. Specific risks of transcatheter PFO closure are reviewed  with the  patient.  The discussion of PFO closure occurs in the context of the patient's age and concomitant cardiovascular problems.  While he is very active and physically fit, he does have significant vascular disease.  It is unclear whether his neurologic events are PFO-related or related to other issues such as atheroembolism.  The patient is compliant with an excellent medical regimen and certainly is secondary risk reduction is optimal at this time.  I think it is reasonable to proceed with a transesophageal echocardiogram to help with decision-making regarding transcatheter PFO closure.  If he has a very small PFO, I would be inclined to treat him medically.  However, if he has evidence of a moderate to large PFO or certainly an associated atrial septal aneurysm, this would be suggestive of higher risk PFO anatomy and transcatheter closure could be considered.  The PFO closure devices are demonstrated to the patient today and I reviewed the procedural steps in detail.  He seems inclined to proceed with PFO closure pending the results of his transesophageal echocardiogram.  I reviewed the risks, indications, and rationale for pursuing a transesophageal echocardiogram.  He agrees to proceed.  We will touch base with him once his transesophageal echocardiogram is completed and reviewed.  In the meantime, he will continue on his regimen which includes aspirin, clopidogrel, and a high intensity statin drug.  We discussed the fact that he should avoid heavy lifting, but he can continue with aerobic exercise and high repetitions/lighter weight lifting.  Sherren Mocha 11/15/2019 11:32 AM

## 2019-11-15 NOTE — Addendum Note (Signed)
Addended by: Patterson Hammersmith A on: 11/15/2019 04:33 PM   Modules accepted: Orders

## 2019-11-18 ENCOUNTER — Ambulatory Visit (HOSPITAL_COMMUNITY)
Admission: RE | Admit: 2019-11-18 | Discharge: 2019-11-18 | Disposition: A | Discharge status: Home / Self Care | Payer: Medicare Other | Attending: Cardiology | Admitting: Cardiology

## 2019-11-18 ENCOUNTER — Ambulatory Visit (HOSPITAL_BASED_OUTPATIENT_CLINIC_OR_DEPARTMENT_OTHER): Payer: Medicare Other

## 2019-11-18 ENCOUNTER — Encounter (HOSPITAL_COMMUNITY): Payer: Self-pay | Admitting: Cardiology

## 2019-11-18 ENCOUNTER — Encounter (HOSPITAL_COMMUNITY)
Admission: RE | Disposition: A | Discharge status: Home / Self Care | Payer: Self-pay | Source: Home / Self Care | Attending: Cardiology

## 2019-11-18 ENCOUNTER — Other Ambulatory Visit: Payer: Self-pay

## 2019-11-18 DIAGNOSIS — I2510 Atherosclerotic heart disease of native coronary artery without angina pectoris: Secondary | ICD-10-CM | POA: Insufficient documentation

## 2019-11-18 DIAGNOSIS — R410 Disorientation, unspecified: Secondary | ICD-10-CM | POA: Insufficient documentation

## 2019-11-18 DIAGNOSIS — Z7902 Long term (current) use of antithrombotics/antiplatelets: Secondary | ICD-10-CM | POA: Insufficient documentation

## 2019-11-18 DIAGNOSIS — I252 Old myocardial infarction: Secondary | ICD-10-CM | POA: Insufficient documentation

## 2019-11-18 DIAGNOSIS — Z85828 Personal history of other malignant neoplasm of skin: Secondary | ICD-10-CM | POA: Insufficient documentation

## 2019-11-18 DIAGNOSIS — I10 Essential (primary) hypertension: Secondary | ICD-10-CM | POA: Diagnosis not present

## 2019-11-18 DIAGNOSIS — I6521 Occlusion and stenosis of right carotid artery: Secondary | ICD-10-CM | POA: Diagnosis not present

## 2019-11-18 DIAGNOSIS — Q211 Atrial septal defect: Secondary | ICD-10-CM

## 2019-11-18 DIAGNOSIS — Z951 Presence of aortocoronary bypass graft: Secondary | ICD-10-CM | POA: Insufficient documentation

## 2019-11-18 DIAGNOSIS — Z79899 Other long term (current) drug therapy: Secondary | ICD-10-CM | POA: Diagnosis not present

## 2019-11-18 DIAGNOSIS — E785 Hyperlipidemia, unspecified: Secondary | ICD-10-CM | POA: Diagnosis not present

## 2019-11-18 DIAGNOSIS — G459 Transient cerebral ischemic attack, unspecified: Secondary | ICD-10-CM

## 2019-11-18 DIAGNOSIS — Q2112 Patent foramen ovale: Secondary | ICD-10-CM

## 2019-11-18 HISTORY — PX: TEE WITHOUT CARDIOVERSION: SHX5443

## 2019-11-18 HISTORY — PX: BUBBLE STUDY: SHX6837

## 2019-11-18 SURGERY — ECHOCARDIOGRAM, TRANSESOPHAGEAL
Anesthesia: Moderate Sedation

## 2019-11-18 MED ORDER — FENTANYL CITRATE (PF) 100 MCG/2ML IJ SOLN
INTRAMUSCULAR | Status: DC | PRN
  Administered 2019-11-18 (×2): 25 ug via INTRAVENOUS

## 2019-11-18 MED ORDER — MIDAZOLAM HCL (PF) 5 MG/ML IJ SOLN
INTRAMUSCULAR | Status: AC
  Filled 2019-11-18: qty 2

## 2019-11-18 MED ORDER — FENTANYL CITRATE (PF) 100 MCG/2ML IJ SOLN
INTRAMUSCULAR | Status: AC
  Filled 2019-11-18: qty 2

## 2019-11-18 MED ORDER — SODIUM CHLORIDE 0.9 % IV SOLN: INTRAVENOUS | Status: DC

## 2019-11-18 MED ORDER — MIDAZOLAM HCL (PF) 10 MG/2ML IJ SOLN
INTRAMUSCULAR | Status: DC | PRN
  Administered 2019-11-18 (×2): 2 mg via INTRAVENOUS

## 2019-11-18 MED ORDER — BUTAMBEN-TETRACAINE-BENZOCAINE 2-2-14 % EX AERO
INHALATION_SPRAY | CUTANEOUS | Status: DC | PRN
  Administered 2019-11-18: 2 via TOPICAL

## 2019-11-18 NOTE — CV Procedure (Addendum)
   Transesophageal Echocardiogram  Indications: PFO CVA  Time out performed  During this procedure the patient is administered a total of Versed 4 mg and Fentanyl 50 mcg to achieve and maintain moderate conscious sedation.  The patient's heart rate, blood pressure, and oxygen saturation are monitored continuously during the procedure. The period of conscious sedation is 17 minutes, of which I was present face-to-face 100% of this time.  Findings:  Left Ventricle: Normal EF  Mitral Valve: P1-2 prolapse mild, mild MR  Aortic Valve: trace AI, mild sclerosis  Tricuspid Valve: Mild TR  Left Atrium: No LAA thrombus  Bubble Contrast Study: Positive, early vigorous (>10 bubbles) right to left shunt. +PFO. No obvious ASD. 1.5cm lip from aortic annulus to septum.   IMPRESSION: PFO present  Tele demonstrated second degree HB type 1. No history of syncope. Very active.   Candee Furbish, MD

## 2019-11-18 NOTE — Interval H&P Note (Signed)
History and Physical Interval Note:  11/18/2019 9:25 AM  Tyler Ortega  has presented today for surgery, with the diagnosis of PFO.  The various methods of treatment have been discussed with the patient and family. After consideration of risks, benefits and other options for treatment, the patient has consented to  Procedure(s): TRANSESOPHAGEAL ECHOCARDIOGRAM (TEE) (N/A) as a surgical intervention.  The patient's history has been reviewed, patient examined, no change in status, stable for surgery.  I have reviewed the patient's chart and labs.  Questions were answered to the patient's satisfaction.     [employer]

## 2019-11-18 NOTE — Progress Notes (Signed)
EKG CRITICAL VALUE     12 lead EKG performed.  Critical value noted. Mora Appl., RN notified.   Ayesha Rumpf Nickey Canedo, CCT 11/18/2019 10:32 AM

## 2019-11-18 NOTE — Progress Notes (Signed)
  Echocardiogram 2D Echocardiogram has been performed.  Tyler Ortega 11/18/2019, 10:10 AM

## 2019-11-18 NOTE — Discharge Instructions (Signed)
TEE  YOU HAD AN CARDIAC PROCEDURE TODAY: Refer to the procedure report and other information in the discharge instructions given to you for any specific questions about what was found during the examination. If this information does not answer your questions, please call [facility] office at [phone] to clarify.   DIET: Your first meal following the procedure should be a light meal and then it is ok to progress to your normal diet. A half-sandwich or bowl of soup is an example of a good first meal. Heavy or fried foods are harder to digest and may make you feel nauseous or bloated. Drink plenty of fluids but you should avoid alcoholic beverages for 24 hours. If you had a esophageal dilation, please see attached instructions for diet.   ACTIVITY: Your care partner should take you home directly after the procedure. You should plan to take it easy, moving slowly for the rest of the day. You can resume normal activity the day after the procedure however YOU SHOULD NOT DRIVE, use power tools, machinery or perform tasks that involve climbing or major physical exertion for 24 hours (because of the sedation medicines used during the test).   SYMPTOMS TO REPORT IMMEDIATELY: A cardiologist can be reached at any hour. Please call [phone] for any of the following symptoms:  Vomiting of blood or coffee ground material  New, significant abdominal pain  New, significant chest pain or pain under the shoulder blades  Painful or persistently difficult swallowing  New shortness of breath  Black, tarry-looking or red, bloody stools  FOLLOW UP:  Please also call with any specific questions about appointments or follow up tests.   Moderate Conscious Sedation, Adult, Care After These instructions provide you with information about caring for yourself after your procedure. Your health care provider may also give you more specific instructions. Your treatment has been planned according to current medical  practices, but problems sometimes occur. Call your health care provider if you have any problems or questions after your procedure. What can I expect after the procedure? After your procedure, it is common:  To feel sleepy for several hours.  To feel clumsy and have poor balance for several hours.  To have poor judgment for several hours.  To vomit if you eat too soon. Follow these instructions at home: For at least 24 hours after the procedure:   Do not: ? Participate in activities where you could fall or become injured. ? Drive. ? Use heavy machinery. ? Drink alcohol. ? Take sleeping pills or medicines that cause drowsiness. ? Make important decisions or sign legal documents. ? Take care of children on your own.  Rest. Eating and drinking  Follow the diet recommended by your health care provider.  If you vomit: ? Drink water, juice, or soup when you can drink without vomiting. ? Make sure you have little or no nausea before eating solid foods. General instructions  Have a responsible adult stay with you until you are awake and alert.  Take over-the-counter and prescription medicines only as told by your health care provider.  If you smoke, do not smoke without supervision.  Keep all follow-up visits as told by your health care provider. This is important. Contact a health care provider if:  You keep feeling nauseous or you keep vomiting.  You feel light-headed.  You develop a rash.  You have a fever. Get help right away if:  You have trouble breathing. This information is not intended to replace advice given  to you by your health care provider. Make sure you discuss any questions you have with your health care provider. Document Revised: 09/05/2017 Document Reviewed: 01/13/2016 Elsevier Patient Education  2020 [employer].

## 2019-11-18 NOTE — Progress Notes (Signed)
Post-procedure EKG showed 2nd degree heart block type 1. Pt asymptomatic. Dr. Marlou Porch aware, ok to discharge home.    Vista Lawman, RN

## 2019-11-22 ENCOUNTER — Telehealth: Payer: Self-pay

## 2019-11-22 MED ORDER — ASPIRIN EC 81 MG PO TBEC: 81.0000 mg | DELAYED_RELEASE_TABLET | Freq: Every day | ORAL | 3 refills | Status: DC

## 2019-11-22 NOTE — Telephone Encounter (Signed)
Per Dr. Burt Knack, " Author: Sherren Mocha, MD Service: Cardiology Author Type: Physician  Filed: 11/22/2019 9:03 AM Date of Service: 11/18/2019 10:52 AM Status: Signed  Editor: Sherren Mocha, MD (Physician)     Spoke with the patient on the telephone over the weekend.  I reviewed his TEE images which demonstrate early right to left shunt with a large amount of bubbles in the left heart within 3 heartbeats.  I think he has at least a moderate and probably a large PFO.  See recent office note.  PFO closure is a reasonable treatment option in this patient because of anatomic characteristics of his PFO and recurrent events.  The patient would like to proceed.  We will contact him to schedule the procedure.  I have reviewed risks, indications, and alternatives of PFO closure at length with him.  His wife is also present for the phone call and she was conferenced in for his recent office visit.  They understand all pertinent issues and would like to proceed.  Sherren Mocha 11/22/2019 9:03 AM       Called patient to discuss scheduling, instructions and COVID screen. Left message to call back.

## 2019-11-22 NOTE — H&P (View-Only) (Signed)
Spoke with the patient on the telephone over the weekend.  I reviewed his TEE images which demonstrate early right to left shunt with a large amount of bubbles in the left heart within 3 heartbeats.  I think he has at least a moderate and probably a large PFO.  See recent office note.  PFO closure is a reasonable treatment option in this patient because of anatomic characteristics of his PFO and recurrent events.  The patient would like to proceed.  We will contact him to schedule the procedure.  I have reviewed risks, indications, and alternatives of PFO closure at length with him.  His wife is also present for the phone call and she was conferenced in for his recent office visit.  They understand all pertinent issues and would like to proceed.  Sherren Mocha 11/22/2019 9:03 AM

## 2019-11-22 NOTE — Telephone Encounter (Signed)
See instruction letter released to [place]. ASA 81 mg daily added to med list.

## 2019-11-22 NOTE — Telephone Encounter (Signed)
-----   Message from Sherren Mocha, MD sent at 11/22/2019  9:01 AM EST -----   ----- Message ----- From: Jerline Pain, MD Sent: 11/18/2019   9:50 AM EST To: Sherren Mocha, MD  PFO present El[place]

## 2019-11-22 NOTE — Telephone Encounter (Signed)
Should be on ASA and plavix for PFO closure. thx

## 2019-11-22 NOTE — Progress Notes (Signed)
Spoke with the patient on the telephone over the weekend.  I reviewed his TEE images which demonstrate early right to left shunt with a large amount of bubbles in the left heart within 3 heartbeats.  I think he has at least a moderate and probably a large PFO.  See recent office note.  PFO closure is a reasonable treatment option in this patient because of anatomic characteristics of his PFO and recurrent events.  The patient would like to proceed.  We will contact him to schedule the procedure.  I have reviewed risks, indications, and alternatives of PFO closure at length with him.  His wife is also present for the phone call and she was conferenced in for his recent office visit.  They understand all pertinent issues and would like to proceed.  Sherren Mocha 11/22/2019 9:03 AM

## 2019-11-22 NOTE — Telephone Encounter (Signed)
The patient agrees to have PFO closure 2/26. COVID test scheduled 2/24. Reviewed instructions with patient. He states he was instructed to STOP ASPIRIN. Informed patient I will check with Dr. Burt Knack and will wait for Aspirin instructions prior to releasing instruction letter to [place].  He was grateful for assistance.

## 2019-12-01 ENCOUNTER — Other Ambulatory Visit (HOSPITAL_COMMUNITY)
Admission: RE | Admit: 2019-12-01 | Discharge: 2019-12-01 | Disposition: A | Discharge status: Home / Self Care | Payer: Medicare Other | Source: Ambulatory Visit | Attending: Cardiovascular Disease | Admitting: Cardiovascular Disease

## 2019-12-01 DIAGNOSIS — Z20822 Contact with and (suspected) exposure to covid-19: Secondary | ICD-10-CM | POA: Diagnosis not present

## 2019-12-01 DIAGNOSIS — Z01812 Encounter for preprocedural laboratory examination: Secondary | ICD-10-CM | POA: Diagnosis not present

## 2019-12-01 LAB — SARS CORONAVIRUS 2 (TAT 6-24 HRS): SARS Coronavirus 2: NEGATIVE

## 2019-12-02 ENCOUNTER — Telehealth: Payer: Self-pay | Admitting: *Deleted

## 2019-12-02 NOTE — Telephone Encounter (Signed)
Pt contacted pre-PFO closure scheduled at [facility] for: Friday December 03, 2019 9:30 AM Verified arrival time and place: [place] [facility]) at: 7:30 AM   No solid food after midnight prior to cath, clear liquids until 5 AM day of procedure. Contrast allergy: no   AM meds can be  taken pre-cath with sip of water including: ASA 81 mg Plavix 75 mg   Confirmed patient has responsible adult to drive home post procedure and observe 24 hours after arriving home: yes  Currently, due to Covid-19 pandemic, only one person will be allowed with patient. Must be the same person for patient's entire stay and will be required to wear a mask. They will be asked to wait in the waiting room for the duration of the patient's stay.  Patients are required to wear a mask when they enter the hospital.      COVID-19 Pre-Screening Questions:  . In the past 7 to 10 days have you had a cough,  shortness of breath, headache, congestion, fever (100 or greater) body aches, chills, sore throat, or sudden loss of taste or sense of smell? no . Have you been around anyone with known Covid 19 in the past 7-10 days? no . Have you been around anyone who is awaiting Covid 19 test results in the past 7 to 10 days? no . Have you been around anyone who has been exposed to Covid 19, or has mentioned symptoms of Covid 19 within the past 7 to 10 days? no

## 2019-12-03 ENCOUNTER — Other Ambulatory Visit: Payer: Self-pay

## 2019-12-03 ENCOUNTER — Ambulatory Visit (HOSPITAL_BASED_OUTPATIENT_CLINIC_OR_DEPARTMENT_OTHER): Payer: Medicare Other

## 2019-12-03 ENCOUNTER — Ambulatory Visit (HOSPITAL_COMMUNITY)
Admission: RE | Admit: 2019-12-03 | Discharge: 2019-12-03 | Disposition: A | Discharge status: Home / Self Care | Payer: Medicare Other | Attending: Cardiovascular Disease | Admitting: Cardiovascular Disease

## 2019-12-03 ENCOUNTER — Encounter (HOSPITAL_COMMUNITY)
Admission: RE | Disposition: A | Discharge status: Home / Self Care | Payer: Self-pay | Source: Home / Self Care | Attending: Cardiovascular Disease

## 2019-12-03 DIAGNOSIS — I252 Old myocardial infarction: Secondary | ICD-10-CM | POA: Insufficient documentation

## 2019-12-03 DIAGNOSIS — Q2112 Patent foramen ovale: Secondary | ICD-10-CM

## 2019-12-03 DIAGNOSIS — I10 Essential (primary) hypertension: Secondary | ICD-10-CM | POA: Diagnosis not present

## 2019-12-03 DIAGNOSIS — I6523 Occlusion and stenosis of bilateral carotid arteries: Secondary | ICD-10-CM | POA: Diagnosis not present

## 2019-12-03 DIAGNOSIS — Z7902 Long term (current) use of antithrombotics/antiplatelets: Secondary | ICD-10-CM | POA: Diagnosis not present

## 2019-12-03 DIAGNOSIS — I2510 Atherosclerotic heart disease of native coronary artery without angina pectoris: Secondary | ICD-10-CM | POA: Insufficient documentation

## 2019-12-03 DIAGNOSIS — Q211 Atrial septal defect: Secondary | ICD-10-CM

## 2019-12-03 DIAGNOSIS — E785 Hyperlipidemia, unspecified: Secondary | ICD-10-CM | POA: Insufficient documentation

## 2019-12-03 DIAGNOSIS — Z951 Presence of aortocoronary bypass graft: Secondary | ICD-10-CM | POA: Insufficient documentation

## 2019-12-03 DIAGNOSIS — Z79899 Other long term (current) drug therapy: Secondary | ICD-10-CM | POA: Diagnosis not present

## 2019-12-03 HISTORY — PX: PATENT FORAMEN OVALE(PFO) CLOSURE: CATH118300

## 2019-12-03 LAB — ECHOCARDIOGRAM LIMITED
Height: 68 in
Weight: 2688 oz

## 2019-12-03 LAB — POCT ACTIVATED CLOTTING TIME
Activated Clotting Time: 246 seconds
Activated Clotting Time: 186 seconds

## 2019-12-03 SURGERY — PATENT FORAMEN OVALE (PFO) CLOSURE
Anesthesia: LOCAL

## 2019-12-03 MED ORDER — SODIUM CHLORIDE 0.9 % WEIGHT BASED INFUSION
3.0000 mL/kg/h | INTRAVENOUS | Status: AC
  Administered 2019-12-03: 3 mL/kg/h via INTRAVENOUS

## 2019-12-03 MED ORDER — HEPARIN SODIUM (PORCINE) 1000 UNIT/ML IJ SOLN
INTRAMUSCULAR | Status: AC
  Filled 2019-12-03: qty 1

## 2019-12-03 MED ORDER — LIDOCAINE HCL (PF) 1 % IJ SOLN
INTRAMUSCULAR | Status: DC | PRN
  Administered 2019-12-03: 14 mL

## 2019-12-03 MED ORDER — FENTANYL CITRATE (PF) 100 MCG/2ML IJ SOLN
INTRAMUSCULAR | Status: DC | PRN
  Administered 2019-12-03 (×2): 25 ug via INTRAVENOUS

## 2019-12-03 MED ORDER — CEFAZOLIN SODIUM-DEXTROSE 2-3 GM-%(50ML) IV SOLR
INTRAVENOUS | Status: AC | PRN
  Administered 2019-12-03: 2 g via INTRAVENOUS

## 2019-12-03 MED ORDER — SODIUM CHLORIDE 0.9 % IV SOLN
INTRAVENOUS | Status: AC | PRN
  Administered 2019-12-03: 10 mL/h via INTRAVENOUS

## 2019-12-03 MED ORDER — CEFAZOLIN SODIUM-DEXTROSE 2-4 GM/100ML-% IV SOLN: 2.0000 g | INTRAVENOUS | Status: DC

## 2019-12-03 MED ORDER — SODIUM CHLORIDE 0.9% FLUSH: 3.0000 mL | Freq: Two times a day (BID) | INTRAVENOUS | Status: DC

## 2019-12-03 MED ORDER — MIDAZOLAM HCL 2 MG/2ML IJ SOLN
INTRAMUSCULAR | Status: DC | PRN
  Administered 2019-12-03: 1 mg via INTRAVENOUS
  Administered 2019-12-03: 2 mg via INTRAVENOUS

## 2019-12-03 MED ORDER — FENTANYL CITRATE (PF) 100 MCG/2ML IJ SOLN
INTRAMUSCULAR | Status: AC
  Filled 2019-12-03: qty 2

## 2019-12-03 MED ORDER — SODIUM CHLORIDE 0.9% FLUSH: 3.0000 mL | INTRAVENOUS | Status: DC | PRN

## 2019-12-03 MED ORDER — LABETALOL HCL 5 MG/ML IV SOLN: 10.0000 mg | INTRAVENOUS | Status: DC | PRN

## 2019-12-03 MED ORDER — ASPIRIN 81 MG PO CHEW: 81.0000 mg | CHEWABLE_TABLET | ORAL | Status: DC

## 2019-12-03 MED ORDER — SODIUM CHLORIDE 0.9 % WEIGHT BASED INFUSION: 1.0000 mL/kg/h | INTRAVENOUS | Status: DC

## 2019-12-03 MED ORDER — SODIUM CHLORIDE 0.9 % IV SOLN: 250.0000 mL | INTRAVENOUS | Status: DC | PRN

## 2019-12-03 MED ORDER — CLOPIDOGREL BISULFATE 75 MG PO TABS: 75.0000 mg | ORAL_TABLET | ORAL | Status: DC

## 2019-12-03 MED ORDER — LIDOCAINE HCL (PF) 1 % IJ SOLN
INTRAMUSCULAR | Status: AC
  Filled 2019-12-03: qty 30

## 2019-12-03 MED ORDER — MIDAZOLAM HCL 2 MG/2ML IJ SOLN
INTRAMUSCULAR | Status: AC
  Filled 2019-12-03: qty 2

## 2019-12-03 MED ORDER — HYDRALAZINE HCL 20 MG/ML IJ SOLN: 10.0000 mg | INTRAMUSCULAR | Status: DC | PRN

## 2019-12-03 MED ORDER — HEPARIN (PORCINE) IN NACL 1000-0.9 UT/500ML-% IV SOLN
INTRAVENOUS | Status: AC
  Filled 2019-12-03: qty 1500

## 2019-12-03 MED ORDER — HEPARIN SODIUM (PORCINE) 1000 UNIT/ML IJ SOLN
INTRAMUSCULAR | Status: DC | PRN
  Administered 2019-12-03: 6000 [IU] via INTRAVENOUS

## 2019-12-03 MED ORDER — ACETAMINOPHEN 325 MG PO TABS: 650.0000 mg | ORAL_TABLET | ORAL | Status: DC | PRN

## 2019-12-03 MED ORDER — ONDANSETRON HCL 4 MG/2ML IJ SOLN: 4.0000 mg | Freq: Four times a day (QID) | INTRAMUSCULAR | Status: DC | PRN

## 2019-12-03 MED ORDER — CEFAZOLIN SODIUM-DEXTROSE 2-4 GM/100ML-% IV SOLN
INTRAVENOUS | Status: AC
  Filled 2019-12-03: qty 100

## 2019-12-03 MED ORDER — HEPARIN (PORCINE) IN NACL 1000-0.9 UT/500ML-% IV SOLN
INTRAVENOUS | Status: DC | PRN
  Administered 2019-12-03 (×2): 500 mL

## 2019-12-03 SURGICAL SUPPLY — 15 items
CATH ACUNAV 8FR 90CM (CATHETERS) ×1 IMPLANT
CATH SUPER TORQUE PLUS 6F MPA1 (CATHETERS) ×1 IMPLANT
COVER SWIFTLINK CONNECTOR (BAG) ×1 IMPLANT
GUIDEWIRE AMPLATZER 1.5JX260 (WIRE) ×2 IMPLANT
GUIDEWIRE ANGLED .035X150CM (WIRE) ×1 IMPLANT
OCCLUDER AMPLATZER PFO 25MM (Prosthesis & Implant Heart) ×1 IMPLANT
PACK CARDIAC CATHETERIZATION (CUSTOM PROCEDURE TRAY) ×2 IMPLANT
PROTECTION STATION PRESSURIZED (MISCELLANEOUS) ×2
SHEATH INTROD W/O MIN 9FR 25CM (SHEATH) ×1 IMPLANT
SHEATH PINNACLE 8F 10CM (SHEATH) ×1 IMPLANT
SHEATH PROBE COVER 6X72 (BAG) ×1 IMPLANT
STATION PROTECTION PRESSURIZED (MISCELLANEOUS) IMPLANT
SYS DELIVER AMP TREVISIO 8FR (SHEATH) ×2
SYSTEM DELIVER AMP TREVIS 8FR (SHEATH) IMPLANT
WIRE EMERALD 3MM-J .035X150CM (WIRE) ×1 IMPLANT

## 2019-12-03 NOTE — Progress Notes (Signed)
  Echocardiogram 2D Echocardiogram has been performed.  Tyler Ortega 12/03/2019, 2:41 PM

## 2019-12-03 NOTE — Progress Notes (Signed)
Site area: 8Fr and 9Fr rt fem venous sheaths Site Prior to Removal: 0 Site Post Removal:Level 0 Pressure Applied For: 17min Manual:   yes Patient Status During Pull:  A/O Post Pull Site:  Level 0 Post Pull Instructions Given:  Post instructions given and pt understands Post Pull Pulses Present: Rt dp2+ Dressing Applied:  Tegaderm and 4x4 Bedrest begins @ 12:35:00 Comments: Pt leaves cath lab holding area in stable condition. Rt groin is unremarkable. Dressing to site is CDI.

## 2019-12-03 NOTE — Progress Notes (Signed)
Pt ambulated to the bathroom without difficulty or bleeding.  Discharge instructions given to pt and pt's wife who verbalize understanding and deny further questions

## 2019-12-03 NOTE — Interval H&P Note (Signed)
History and Physical Interval Note:  12/03/2019 1:06 PM  Tyler Ortega  has presented today for surgery, with the diagnosis of pfo.  The various methods of treatment have been discussed with the patient and family. After consideration of risks, benefits and other options for treatment, the patient has consented to  Procedure(s): PATENT FORAMEN OVALE (PFO) CLOSURE (N/A) as a surgical intervention.  The patient's history has been reviewed, patient examined, no change in status, stable for surgery.  I have reviewed the patient's chart and labs.  Questions were answered to the patient's satisfaction.     Sherren Mocha

## 2019-12-03 NOTE — Discharge Instructions (Signed)
Patent Foramen Ovale Closure, Adult A patent foramen ovale (PFO) is a hole between the upper chambers (right atrium and left atrium) of the heart. A patent foramen ovale closure is a surgical procedure to close this hole by placing a closure device inside the hole. The most common type of surgery for patent foramen ovale closure is cardiac catheterization. This involves inserting a long, thin tube (catheter) into a vein and threading it to the heart. Tell a health care provider about:  Any allergies you have.  All medicines you are taking, including vitamins, herbs, eye drops, creams, and over-the-counter medicines.  Any problems you or family members have had with anesthetic medicines.  Any blood disorders you have.  Any surgeries you have had.  Any medical conditions you have.  Whether you are pregnant or may be pregnant. What are the risks? Generally, this is a safe procedure. However, problems may occur, including:  Infection.  Bleeding.  Stroke.  Allergic reactions to medicines, dyes, or the closure device.  Damage to other structures or organs.  The closure device moving out of position in the heart.  Irregular heartbeat.  A heartbeat that is too slow (heart block, or bradycardia). What happens before the procedure? Medicines  Ask your health care provider about: ? Changing or stopping your regular medicines before and after the procedure. This is especially important if you are taking diabetes medicines or blood thinners. ? Taking medicines such as aspirin and ibuprofen. These medicines can thin your blood. Do not take these medicines before your procedure if your health care provider instructs you not to. Staying hydrated Follow instructions from your health care provider about hydration, which may include:  Up to 2 hours before the procedure - you may continue to drink clear liquids, such as water, clear fruit juice, black coffee, and plain tea. Eating and  drinking restrictions Follow instructions from your health care provider about eating and drinking, which may include:  8 hours before the procedure - stop eating heavy meals or foods such as meat, fried foods, or fatty foods.  6 hours before the procedure - stop eating light meals or foods, such as toast or cereal.  6 hours before the procedure - stop drinking milk or drinks that contain milk.  2 hours before the procedure - stop drinking clear liquids. General instructions  Do not use any products that contain nicotine or tobacco, such as cigarettes and e-cigarettes. If you need help quitting, ask your health care provider.  You may need blood tests.  You may be asked to shower with a germ-killing soap.  Plan to have someone take you home from the hospital or clinic. What happens during the procedure?   To lower your risk of infection: ? Your health care team will wash or sanitize their hands. ? Your skin will be washed with soap. ? Hair may be removed from the surgical area.  An IV will be inserted into one of your veins.  You may be given one or more of the following: ? A medicine to help you relax (sedative). ? A medicine to numb the area for the procedure (local anesthetic).  Sticky patches (electrodes) will be placed on your chest. The electrodes will be connected through wires to an electrocardiogram (ECG) monitor. The ECG will record the electrical activity of your heart during the procedure.  A small incision or puncture will be made in your inner thigh or groin area.  A catheter will be inserted into a vein  in your inner thigh or groin area. The catheter will be moved up to your heart.  Contrast dye may be injected into your heart, and then X-rays of your heart may be taken to help position the closure device. Dye helps to make your heart structures show up on X-rays.  A closure device will be passed through the catheter and inserted to close the hole in your  heart.  The catheter will be removed.  Pressure will be placed on the vein in your inner thigh or groin area to prevent bleeding.  The vein where the catheter was inserted may be closed with stitches (sutures).  A bandage (dressing) will be placed over the inner thigh or groin site. The procedure may vary among health care providers and hospitals. What happens after the procedure?  Your blood pressure, heart rate, breathing rate, and blood oxygen level will be monitored until the medicines you were given have worn off.  You will need to stay in the hospital after the procedure. Ask your health care provider how long you will stay.  You may have to wear compression stockings. These stockings help to prevent blood clots and reduce swelling in your legs.  You will have an imaging test to check the closure device, such as a chest X-ray, ECG, or echocardiogram.  You may have some pain. You will be given pain medicine as needed.  Do not drive until your health care provider approves. Summary  A patent foramen ovale closure is a procedure to close a hole between the upper chambers (right atrium and left atrium) of the heart.  The most common type of surgery for patent foramen ovale closure is cardiac catheterization. This involves inserting a long, thin tube (catheter) into a vein and threading it to the heart so a closure device can be placed in the opening.  You will need to stay in the hospital after a patent foramen ovale closure. Ask your health care provider how long you will stay. This information is not intended to replace advice given to you by your health care provider. Make sure you discuss any questions you have with your health care provider. Document Revised: 01/19/2019 Document Reviewed: 10/24/2016 Elsevier Patient Education  [place].

## 2019-12-03 NOTE — Progress Notes (Signed)
Echo present Dr Burt Knack  Notified of pt's rhythm which appears to be 2nd degree AV block.  Pt also noted to have  this rhythm preprocedure

## 2020-01-02 NOTE — Progress Notes (Signed)
[place]                                       Cardiology Office Note    Date:  01/05/2020   ID:  Tyler Ortega, DOB [birth year], MRN [id number]  PCP:  Colon Branch, MD  Cardiologist: Dr. Prince Rome ([facility])---> wants to establish with Dr. Marlou Porch   CC: 1 month s/p PFO closure.   History of Present Illness:  Tyler Ortega is a 79 y.o. male with a history of CAD s/p CABG and PCI, carotid artery stenosis, HTN, HLD and PFO s/p PFO closure who presents to clinic for follow up.   He first had an episode of transient amnesia about 3 years ago when he went to the gym. He returned home and didn't remember being there. More recently, he woke up and didn't remember what he was supposed to do that day. He reported significant cnfusion. He later sought neurology evaluation with [name] and underwent testing that included an echocardiogram. The study demonstrated cardiac function with LVEF 55%. There was no significant valvular disease identified. There is a bubble study completed, demonstrating bubbles in the left heart within 3 beats, suggestive of a PFO. An event monitor showed no evidence of atrial fibrillation or flutter. A CTA of the neck showed a 65% atheromatous stenosis of the right ICA bulb. An MRI of the brain demonstrated new small cerebellar infarcts.  The patient was felt to have evidence of embolic appearing infarcts in the cerebellum and was referred for consideration of PFO closure. TEE confirmed PFO.   He underwent successful transcatheter PFO closure with a 25 mm Amplatzer PFO Occluder on 12/03/19. Post op echo showed noraml EF with no evidence of atrial level shunt (although not well visualized). He was discharged on aspirin and plavix.  Today he presents to clinic for follow up. No CP or SOB. No LE edema, orthopnea or PND. No dizziness or syncope. No blood in stool or urine. No palpitations. Doing excellent. Exercising regularly with  no issues. No neurologic issues.   Past Medical History:  Diagnosis Date  . Arthritis    osteoarthritis -hips, back pain tx with Prednisone-last dose 09-04-15.  Marland Kitchen CAD (coronary artery disease)    MI 2011, seen @ [name], sees Public librarian (once a year)  . Hyperlipidemia   . Hypertension   . Skin cancer    R calf and the ear (?side)  . Transfusion history    2nd Hip surgery    Past Surgical History:  Procedure Laterality Date  . BUBBLE STUDY  11/18/2019   Procedure: BUBBLE STUDY;  Surgeon: Jerline Pain, MD;  Location: [facility] ENDOSCOPY;  Service: Cardiovascular;;  . COLONOSCOPY  05/29/11   Normal  . CORONARY ARTERY BYPASS GRAFT     05-2010-x4 vessel -[place] Bilateral 2019   cataract  . HIP SURGERY Left    x3 - L hip (initial hip replacement bleed)  . INGUINAL HERNIA REPAIR Left   . PATENT FORAMEN OVALE(PFO) CLOSURE N/A 12/03/2019   Procedure: PATENT FORAMEN OVALE (PFO) CLOSURE;  Surgeon: Sherren Mocha, MD;  Location: [facility] CV LAB;  Service: Cardiovascular;  Laterality: N/A;  . TEE WITHOUT CARDIOVERSION N/A 11/18/2019   Procedure: TRANSESOPHAGEAL ECHOCARDIOGRAM (TEE);  Surgeon: Jerline Pain, MD;  Location[facility];  Service: Cardiovascular;  Laterality:  N/A;  . TOTAL HIP ARTHROPLASTY Right 09/12/2015   Procedure: RIGHT TOTAL HIP ARTHROPLASTY ANTERIOR APPROACH;  Surgeon: Paralee Cancel, MD;  Location: [facility] ORS;  Service: Orthopedics;  Laterality: Right;  Marland Kitchen VASECTOMY      Current Medications: Outpatient Medications Prior to Visit  Medication Sig Dispense Refill  . atorvastatin (LIPITOR) 80 MG tablet Take 80 mg by mouth daily.    . cholecalciferol (VITAMIN D3) 25 MCG (1000 UT) tablet Take 1,000 Units by mouth daily.    . clopidogrel (PLAVIX) 75 MG tablet Take 1 tablet (75 mg total) by mouth daily. 30 tablet 11  . Multiple Vitamin (MULTIVITAMIN WITH MINERALS) TABS tablet Take 1 tablet by mouth daily.    . nitroGLYCERIN (NITROSTAT) 0.4 MG SL tablet  Place 0.4 mg under the tongue every 5 (five) minutes x 3 doses as needed for chest pain.     . vitamin B-12 (CYANOCOBALAMIN) 1000 MCG tablet Take 1,000 mcg by mouth daily.     Marland Kitchen aspirin EC 81 MG tablet Take 1 tablet (81 mg total) by mouth daily. (Patient not taking: Reported on 01/05/2020) 90 tablet 3   Facility-Administered Medications Prior to Visit  Medication Dose Route Frequency Provider Last Rate Last Admin  . sodium chloride 3% (hypertonic) IV bolus 20 mL  20 mL Intravenous Once Melvenia Beam, MD         Allergies:   Patient has no known allergies.   Social History   Socioeconomic History  . Marital status: Married    Spouse name: Tyler Ortega  . Number of children: 3  . Years of education: Some college  . Highest education level: Not on file  Occupational History  . Occupation: Armed forces operational officer, Retired    Fish farm manager: OTHER  Tobacco Use  . Smoking status: Never Smoker  . Smokeless tobacco: Never Used  Substance and Sexual Activity  . Alcohol use: Yes    Comment: rarely, last 6 months ago  . Drug use: No  . Sexual activity: Not on file  Other Topics Concern  . Not on file  Social History Narrative   1 Daughter lives in [place], lives w/ wife    Caffeine use: 1-2 cups coffee per day   Soda: diet pepsi a few times a week   Right handed   Social Determinants of Health   Financial Resource Strain:   . Difficulty of Paying Living Expenses:   Food Insecurity:   . Worried About Charity fundraiser in the Last Year:   . Arboriculturist in the Last Year:   Transportation Needs:   . Film/video editor (Medical):   Marland Kitchen Lack of Transportation (Non-Medical):   Physical Activity:   . Days of Exercise per Week:   . Minutes of Exercise per Session:   Stress:   . Feeling of Stress :   Social Connections:   . Frequency of Communication with Friends and Family:   . Frequency of Social Gatherings with Friends and Family:   . Attends Religious Services:   . Active Member of  Clubs or Organizations:   . Attends Archivist Meetings:   Marland Kitchen Marital Status:      Family History:  The patient's family history is not on file.     ROS:   Please see the history of present illness.    ROS All other systems reviewed and are negative.   PHYSICAL EXAM:   VS:  BP 124/70   Pulse 72   Ht 5'  8" (1.727 m)   Wt 174 lb 9.6 oz (79.2 kg)   SpO2 96%   BMI 26.55 kg/m    GEN: Well nourished, well developed, in no acute distress, very healthy appearing HEENT: normal Neck: no JVD or masses Cardiac: RRR; no murmurs, rubs, or gallops,no edema  Respiratory:  clear to auscultation bilaterally, normal work of breathing GI: soft, nontender, nondistended, + BS MS: no deformity or atrophy Skin: warm and dry, no rash Neuro:  Alert and Oriented x 3, Strength and sensation are intact Psych: euthymic mood, full affect   Wt Readings from Last 3 Encounters:  01/05/20 174 lb 9.6 oz (79.2 kg)  12/03/19 168 lb (76.2 kg)  11/18/19 167 lb 6.4 oz (75.9 kg)      Studies/Labs Reviewed:   EKG:  EKG is NOT ordered today.  Recent Labs: 04/22/2019: ALT 15 11/15/2019: BUN 20; Creatinine, Ser 1.25; Hemoglobin 15.3; Platelets 186; Potassium 4.8; Sodium 141   Lipid Panel    Component Value Date/Time   CHOL 126 04/22/2019 0826   TRIG 48.0 04/22/2019 0826   HDL 56.40 04/22/2019 0826   CHOLHDL 2 04/22/2019 0826   VLDL 9.6 04/22/2019 0826   LDLCALC 60 04/22/2019 0826    Additional studies/ records that were reviewed today include:   12/03/19 PATENT FORAMEN OVALE (PFO) CLOSURE  Conclusion  Successful transcatheter PFO closure with a 25 mm Amplatzer PFO Occluder under fluoroscopic and intracardiac echo guidance  Recommendations  Antiplatelet/Anticoag Recommend uninterrupted dual antiplatelet therapy with Aspirin 81mg  daily and Clopidogrel 75mg  daily for 3 months.   ___________________  Echo 12/03/19 IMPRESSIONS  1. Left ventricular ejection fraction, by estimation, is 60 to  65%. The left ventricle has normal function. The left ventricle has no regional wall motion abnormalities.  2. Right ventricular systolic function is normal. The right ventricular  size is normal.  3. Left atrial size was mildly dilated.  4. Right atrial size was mildly dilated.  5. The mitral valve is normal in structure and function. Trivial mitral  valve regurgitation. No evidence of mitral stenosis.  6. The aortic valve is tricuspid. Aortic valve regurgitation is not  visualized. Mild aortic valve sclerosis is present, with no evidence of aortic valve stenosis.  7. The inferior vena cava is normal in size with greater than 50%  respiratory variability, suggesting right atrial pressure of 3 mmHg.    ASSESSMENT & PLAN:   PFO s/p PFO closure: doing excellent. Groin site healing well. Continue aspirin 81 mg daily and Plavix 75 mg daily. He can stop Plavix after 3 months (02/2020). I will see him back in 1 year for follow up and echo with bubble study. He understands the need for SBE prophylaxis x 6 months. He has been using SBE prophylaxis long term for previous hip replacements which he plans to continue ( I told him this is not a part of the current SBE prophylaxis guidelines).  CAD s/p CABG: continue on aspirin, plavix, statin. No chest pain. Of note, he previously followed at [facility] but would like to switch to Dr. Marlou Porch with our practice. He will call into our office when it's time for his annual apt and get established.   Carotid artery disease: continue medical therapy.    Medication Adjustments/Labs and Tests Ordered: Current medicines are reviewed at length with the patient today.  Concerns regarding medicines are outlined above.  Medication changes, Labs and Tests ordered today are listed in the Patient Instructions below. Patient Instructions  Please Stop your Plavix  on 03/01/20 (okay if a little before if you run out of pills). Please stay on all your other medications  including aspirin 81 indefinitely. Call us when you are due for your annual cardiology visit and we will get you in to see Dr. Marlou Porch. Joellen Jersey will see you back in 1 year with an echo with bubble on 11/30/20.    Signed, Angelena Form, PA-C  01/05/2020 2:01 PM    [facility] [place], [place], [place]  [address] Phone: [phone]; Fax: [phone]

## 2020-01-05 ENCOUNTER — Encounter: Payer: Self-pay | Admitting: Physician Assistant

## 2020-01-05 ENCOUNTER — Other Ambulatory Visit: Payer: Self-pay | Admitting: Physician Assistant

## 2020-01-05 ENCOUNTER — Ambulatory Visit: Payer: Medicare Other | Admitting: Physician Assistant

## 2020-01-05 ENCOUNTER — Other Ambulatory Visit: Payer: Self-pay

## 2020-01-05 VITALS — BP 124/70 | HR 72 | Ht 68.0 in | Wt 174.6 lb

## 2020-01-05 DIAGNOSIS — I25810 Atherosclerosis of coronary artery bypass graft(s) without angina pectoris: Secondary | ICD-10-CM

## 2020-01-05 DIAGNOSIS — I6529 Occlusion and stenosis of unspecified carotid artery: Secondary | ICD-10-CM

## 2020-01-05 DIAGNOSIS — Z8774 Personal history of (corrected) congenital malformations of heart and circulatory system: Secondary | ICD-10-CM

## 2020-01-05 NOTE — Patient Instructions (Addendum)
Please Stop your Plavix on 03/01/20 (okay if a little before if you run out of pills). Please stay on all your other medications including aspirin 81 indefinitely. Call us when you are due for your annual cardiology visit and we will get you in to see Dr. Marlou Porch. Tyler Ortega will see you back in 1 year with an echo with bubble on 11/30/20.

## 2020-01-13 NOTE — Progress Notes (Signed)
Nurse connected with patient 01/14/20 at 11:45 AM EDT by a telephone enabled telemedicine application and verified that I am speaking with the correct person using two identifiers. Patient stated full name and DOB. Patient gave permission to continue with virtual visit. Patient's location was at home and Nurse's location was at [place] office.   Subjective:   Tyler Ortega is a 79 y.o. male who presents for Medicare Annual/Subsequent preventive examination.  Enjoys golf 2-3x/ wk. Also teaches Sunday school.    Review of Systems:  Cardiac Risk Factors include: advanced age (>32men, >27 women);dyslipidemia;hypertension;male gender Home Safety/Smoke Alarms: Feels safe in home. Smoke alarms in place.  Lives w/wife. No stairs.   Male:   CCS- 05/29/11.    PSA-  Lab Results  Component Value Date   PSA 1.16 04/22/2019   PSA 1.95 09/16/2016   PSA 1.35 05/30/2014       Objective:    Vitals: BP 124/78 Comment: pt reported vitals  Pulse 68   There is no height or weight on file to calculate BMI.  Advanced Directives 01/14/2020 12/03/2019 11/18/2019 10/22/2019 09/12/2015 09/12/2015 09/05/2015  Does Patient Have a Medical Advance Directive? Yes Yes Yes Yes Yes - Yes  Type of Advance Directive [place];Living will [place];Living will [place];Living will [place];Living will Living will - Living will  Does patient want to make changes to medical advance directive? No - Patient declined No - Patient declined - No - Patient declined No - Patient declined - No - Patient declined  Copy of [place] in Chart? No - copy requested No - copy requested No - copy requested - Yes Yes No - copy requested    Tobacco Social History   Tobacco Use  Smoking Status Never Smoker  Smokeless Tobacco Never Used     Counseling given: Not Answered   Clinical Intake: Pain : No/denies pain      Past Medical  History:  Diagnosis Date  . Arthritis    osteoarthritis -hips, back pain tx with Prednisone-last dose 09-04-15.  Marland Kitchen CAD (coronary artery disease)    MI 2011, seen @ [name], sees Public librarian (once a year)  . Hyperlipidemia   . Hypertension   . Skin cancer    R calf and the ear (?side)  . Transfusion history    2nd Hip surgery   Past Surgical History:  Procedure Laterality Date  . BUBBLE STUDY  11/18/2019   Procedure: BUBBLE STUDY;  Surgeon: Jerline Pain, MD;  Location: [facility] ENDOSCOPY;  Service: Cardiovascular;;  . COLONOSCOPY  05/29/11   Normal  . CORONARY ARTERY BYPASS GRAFT     05-2010-x4 vessel -[place] Bilateral 2019   cataract  . HIP SURGERY Left    x3 - L hip (initial hip replacement bleed)  . INGUINAL HERNIA REPAIR Left   . PATENT FORAMEN OVALE(PFO) CLOSURE N/A 12/03/2019   Procedure: PATENT FORAMEN OVALE (PFO) CLOSURE;  Surgeon: Sherren Mocha, MD;  Location: [place] CV LAB;  Service: Cardiovascular;  Laterality: N/A;  . TEE WITHOUT CARDIOVERSION N/A 11/18/2019   Procedure: TRANSESOPHAGEAL ECHOCARDIOGRAM (TEE);  Surgeon: Jerline Pain, MD;  Location: [facility] ENDOSCOPY;  Service: Cardiovascular;  Laterality: N/A;  . TOTAL HIP ARTHROPLASTY Right 09/12/2015   Procedure: RIGHT TOTAL HIP ARTHROPLASTY ANTERIOR APPROACH;  Surgeon: Paralee Cancel, MD;  Location: [facility] ORS;  Service: Orthopedics;  Laterality: Right;  Marland Kitchen VASECTOMY     Family  History  Problem Relation Age of Onset  . Coronary artery disease Neg Hx   . Diabetes Neg Hx   . Colon cancer Neg Hx   . Prostate cancer Neg Hx   . Seizures Neg Hx   . Multiple sclerosis Neg Hx   . Migraines Neg Hx   . Dementia Neg Hx    Social History   Socioeconomic History  . Marital status: Married    Spouse name: [name]  . Number of children: 3  . Years of education: Some college  . Highest education level: Not on file  Occupational History  . Occupation: Armed forces operational officer, Retired    Fish farm manager: OTHER  Tobacco  Use  . Smoking status: Never Smoker  . Smokeless tobacco: Never Used  Substance and Sexual Activity  . Alcohol use: Yes    Comment: rarely, last 6 months ago  . Drug use: No  . Sexual activity: Not on file  Other Topics Concern  . Not on file  Social History Narrative   1 Daughter lives in [place], lives w/ wife    Caffeine use: 1-2 cups coffee per day   Soda: diet pepsi a few times a week   Right handed   Social Determinants of Health   Financial Resource Strain: Low Risk   . Difficulty of Paying Living Expenses: Not hard at all  Food Insecurity: No Food Insecurity  . Worried About Charity fundraiser in the Last Year: Never true  . Ran Out of Food in the Last Year: Never true  Transportation Needs: No Transportation Needs  . Lack of Transportation (Medical): No  . Lack of Transportation (Non-Medical): No  Physical Activity:   . Days of Exercise per Week:   . Minutes of Exercise per Session:   Stress:   . Feeling of Stress :   Social Connections:   . Frequency of Communication with Friends and Family:   . Frequency of Social Gatherings with Friends and Family:   . Attends Religious Services:   . Active Member of Clubs or Organizations:   . Attends Archivist Meetings:   Marland Kitchen Marital Status:     Outpatient Encounter Medications as of 01/14/2020  Medication Sig  . atorvastatin (LIPITOR) 80 MG tablet Take 80 mg by mouth daily.  . cholecalciferol (VITAMIN D3) 25 MCG (1000 UT) tablet Take 1,000 Units by mouth daily.  . clopidogrel (PLAVIX) 75 MG tablet Take 1 tablet (75 mg total) by mouth daily.  . Multiple Vitamin (MULTIVITAMIN WITH MINERALS) TABS tablet Take 1 tablet by mouth daily.  . vitamin B-12 (CYANOCOBALAMIN) 1000 MCG tablet Take 1,000 mcg by mouth daily.   . nitroGLYCERIN (NITROSTAT) 0.4 MG SL tablet Place 0.4 mg under the tongue every 5 (five) minutes x 3 doses as needed for chest pain.    Facility-Administered Encounter Medications as of 01/14/2020   Medication  . sodium chloride 3% (hypertonic) IV bolus 20 mL    Activities of Daily Living In your present state of health, do you have any difficulty performing the following activities: 01/14/2020 04/22/2019  Hearing? N N  Vision? N N  Difficulty concentrating or making decisions? N N  Walking or climbing stairs? N N  Dressing or bathing? N N  Doing errands, shopping? N N  Preparing Food and eating ? N -  Using the Toilet? N -  In the past six months, have you accidently leaked urine? N -  Do you have problems with loss of bowel control? N -  Managing your Medications? N -  Managing your Finances? N -  Housekeeping or managing your Housekeeping? N -  Some recent data might be hidden    Patient Care Team: Colon Branch, MD as PCP - Cyndia Diver, MD as PCP - Structural Heart (Cardiology) Powers, Elyse Jarvis, MD as Referring Physician (Cardiology) Melvenia Beam, MD as Consulting Physician (Neurology) Dimas Alexandria, MD as Consulting Physician (Ophthalmology)   Assessment:   This is a routine wellness examination for Joseandres. Physical assessment deferred to PCP.  Exercise Activities and Dietary recommendations Current Exercise Habits: Structured exercise class, Type of exercise: strength training/weights, Time (Minutes): 30, Frequency (Times/Week): 4, Weekly Exercise (Minutes/Week): 120, Intensity: Mild, Exercise limited by: None identified Diet (meal preparation, eat out, water intake, caffeinated beverages, dairy products, fruits and vegetables): in general, a "healthy" diet  , well balanced   Goals    . DIET - INCREASE WATER INTAKE       Fall Risk Fall Risk  01/14/2020 04/22/2019 11/03/2017 10/15/2017 09/16/2016  Falls in the past year? 0 0 No No No  Number falls in past yr: 0 0 - - -  Injury with Fall? 0 - - - -  Follow up Education provided;Falls prevention discussed - - - -  Depression Screen PHQ 2/9 Scores 01/14/2020 04/22/2019 11/03/2017 09/16/2016  PHQ - 2 Score 0  0 0 0    Cognitive Function Ad8 score reviewed for issues:  Issues making decisions:no  Less interest in hobbies / activities:no  Repeats questions, stories (family complaining):no  Trouble using ordinary gadgets (microwave, computer, phone):no  Forgets the month or year: no  Mismanaging finances: no  Remembering appts:no  Daily problems with thinking and/or memory:no Ad8 score is=0  MMSE - Mini Mental State Exam 03/05/2016  Orientation to time 4  Orientation to Place 5  Registration 3  Attention/ Calculation 5  Recall 3  Language- name 2 objects 2  Language- repeat 1  Language- follow 3 step command 3  Language- read & follow direction 1  Write a sentence 1  Copy design 1  Total score 29        Immunization History  Administered Date(s) Administered  . Fluad Quad(high Dose 65+) 05/29/2019  . Influenza, High Dose Seasonal PF 08/01/2015, 09/16/2016  . Influenza-Unspecified 07/07/2014, 08/01/2017, 07/07/2018  . Pneumococcal Conjugate-13 06/01/2015  . Pneumococcal Polysaccharide-23 05/30/2014  . Tdap 04/25/2011, 03/14/2018  . Zoster Recombinat (Shingrix) 04/26/2019, 07/29/2019    Screening Tests Health Maintenance  Topic Date Due  . INFLUENZA VACCINE  05/07/2020  . TETANUS/TDAP  03/14/2028  . PNA vac Low Risk Adult  Completed     Plan:    Please schedule your next medicare wellness visit with me in 1 yr.  Keep up the great work!  I have personally reviewed and noted the following in the patient's chart:   . Medical and social history . Use of alcohol, tobacco or illicit drugs  . Current medications and supplements . Functional ability and status . Nutritional status . Physical activity . Advanced directives . List of other physicians . Hospitalizations, surgeries, and ER visits in previous 12 months . Vitals . Screenings to include cognitive, depression, and falls . Referrals and appointments  In addition, I have reviewed and discussed with  patient certain preventive protocols, quality metrics, and best practice recommendations. A written personalized care plan for preventive services as well as general preventive health recommendations were provided to patient.     Shela Nevin, RN  01/14/2020   

## 2020-01-14 ENCOUNTER — Ambulatory Visit (INDEPENDENT_AMBULATORY_CARE_PROVIDER_SITE_OTHER): Payer: Medicare Other | Admitting: *Deleted

## 2020-01-14 ENCOUNTER — Encounter: Payer: Self-pay | Admitting: *Deleted

## 2020-01-14 ENCOUNTER — Other Ambulatory Visit: Payer: Self-pay

## 2020-01-14 VITALS — BP 124/78 | HR 68

## 2020-01-14 DIAGNOSIS — Z0000 Encounter for general adult medical examination without abnormal findings: Secondary | ICD-10-CM

## 2020-01-14 NOTE — Patient Instructions (Signed)
Please schedule your next medicare wellness visit with me in 1 yr.  Keep up the great work!   Tyler Ortega , Thank you for taking time to come for your Medicare Wellness Visit. I appreciate your ongoing commitment to your health goals. Please review the following plan we discussed and let me know if I can assist you in the future.   These are the goals we discussed: Goals    . DIET - INCREASE WATER INTAKE       This is a list of the screening recommended for you and due dates:  Health Maintenance  Topic Date Due  . Flu Shot  05/07/2020  . Tetanus Vaccine  03/14/2028  . Pneumonia vaccines  Completed    Preventive Care 46 Years and Older, Male Preventive care refers to lifestyle choices and visits with your health care provider that can promote health and wellness. This includes:  A yearly physical exam. This is also called an annual well check.  Regular dental and eye exams.  Immunizations.  Screening for certain conditions.  Healthy lifestyle choices, such as diet and exercise. What can I expect for my preventive care visit? Physical exam Your health care provider will check:  Height and weight. These may be used to calculate body mass index (BMI), which is a measurement that tells if you are at a healthy weight.  Heart rate and blood pressure.  Your skin for abnormal spots. Counseling Your health care provider may ask you questions about:  Alcohol, tobacco, and drug use.  Emotional well-being.  Home and relationship well-being.  Sexual activity.  Eating habits.  History of falls.  Memory and ability to understand (cognition).  Work and work Statistician. What immunizations do I need?  Influenza (flu) vaccine  This is recommended every year. Tetanus, diphtheria, and pertussis (Tdap) vaccine  You may need a Td booster every 10 years. Varicella (chickenpox) vaccine  You may need this vaccine if you have not already been vaccinated. Zoster (shingles)  vaccine  You may need this after age 75. Pneumococcal conjugate (PCV13) vaccine  One dose is recommended after age 57. Pneumococcal polysaccharide (PPSV23) vaccine  One dose is recommended after age 27. Measles, mumps, and rubella (MMR) vaccine  You may need at least one dose of MMR if you were born in 1957 or later. You may also need a second dose. Meningococcal conjugate (MenACWY) vaccine  You may need this if you have certain conditions. Hepatitis A vaccine  You may need this if you have certain conditions or if you travel or work in places where you may be exposed to hepatitis A. Hepatitis B vaccine  You may need this if you have certain conditions or if you travel or work in places where you may be exposed to hepatitis B. Haemophilus influenzae type b (Hib) vaccine  You may need this if you have certain conditions. You may receive vaccines as individual doses or as more than one vaccine together in one shot (combination vaccines). Talk with your health care provider about the risks and benefits of combination vaccines. What tests do I need? Blood tests  Lipid and cholesterol levels. These may be checked every 5 years, or more frequently depending on your overall health.  Hepatitis C test.  Hepatitis B test. Screening  Lung cancer screening. You may have this screening every year starting at age 35 if you have a 30-pack-year history of smoking and currently smoke or have quit within the past 15 years.  Colorectal  cancer screening. All adults should have this screening starting at age 91 and continuing until age 78. Your health care provider may recommend screening at age 11 if you are at increased risk. You will have tests every 1-10 years, depending on your results and the type of screening test.  Prostate cancer screening. Recommendations will vary depending on your family history and other risks.  Diabetes screening. This is done by checking your blood sugar (glucose)  after you have not eaten for a while (fasting). You may have this done every 1-3 years.  Abdominal aortic aneurysm (AAA) screening. You may need this if you are a current or former smoker.  Sexually transmitted disease (STD) testing. Follow these instructions at home: Eating and drinking  Eat a diet that includes fresh fruits and vegetables, whole grains, lean protein, and low-fat dairy products. Limit your intake of foods with high amounts of sugar, saturated fats, and salt.  Take vitamin and mineral supplements as recommended by your health care provider.  Do not drink alcohol if your health care provider tells you not to drink.  If you drink alcohol: ? Limit how much you have to 0-2 drinks a day. ? Be aware of how much alcohol is in your drink. In the [place] one drink equals one 12 oz bottle of beer (355 mL), one 5 oz glass of wine (148 mL), or one 1 oz glass of hard liquor (44 mL). Lifestyle  Take daily care of your teeth and gums.  Stay active. Exercise for at least 30 minutes on 5 or more days each week.  Do not use any products that contain nicotine or tobacco, such as cigarettes, e-cigarettes, and chewing tobacco. If you need help quitting, ask your health care provider.  If you are sexually active, practice safe sex. Use a condom or other form of protection to prevent STIs (sexually transmitted infections).  Talk with your health care provider about taking a low-dose aspirin or statin. What's next?  Visit your health care provider once a year for a well check visit.  Ask your health care provider how often you should have your eyes and teeth checked.  Stay up to date on all vaccines. This information is not intended to replace advice given to you by your health care provider. Make sure you discuss any questions you have with your health care provider. Document Revised: 09/17/2018 Document Reviewed: 09/17/2018 Elsevier Patient Education  2020 [employer].

## 2020-03-30 DIAGNOSIS — M25512 Pain in left shoulder: Secondary | ICD-10-CM | POA: Diagnosis not present

## 2020-04-24 ENCOUNTER — Ambulatory Visit (INDEPENDENT_AMBULATORY_CARE_PROVIDER_SITE_OTHER): Payer: Medicare Other | Admitting: Internal Medicine

## 2020-04-24 ENCOUNTER — Other Ambulatory Visit: Payer: Self-pay

## 2020-04-24 ENCOUNTER — Encounter: Payer: Self-pay | Admitting: Internal Medicine

## 2020-04-24 VITALS — BP 149/92 | HR 48 | Temp 98.0°F | Resp 18 | Ht 68.0 in | Wt 172.5 lb

## 2020-04-24 DIAGNOSIS — E538 Deficiency of other specified B group vitamins: Secondary | ICD-10-CM

## 2020-04-24 DIAGNOSIS — Z0000 Encounter for general adult medical examination without abnormal findings: Secondary | ICD-10-CM

## 2020-04-24 DIAGNOSIS — R739 Hyperglycemia, unspecified: Secondary | ICD-10-CM

## 2020-04-24 DIAGNOSIS — E785 Hyperlipidemia, unspecified: Secondary | ICD-10-CM

## 2020-04-24 LAB — COMPREHENSIVE METABOLIC PANEL
ALT: 15 U/L (ref 0–53)
AST: 19 U/L (ref 0–37)
Albumin: 3.9 g/dL (ref 3.5–5.2)
Alkaline Phosphatase: 67 U/L (ref 39–117)
BUN: 23 mg/dL (ref 6–23)
CO2: 29 mEq/L (ref 19–32)
Calcium: 9.3 mg/dL (ref 8.4–10.5)
Chloride: 106 mEq/L (ref 96–112)
Creatinine, Ser: 1.17 mg/dL (ref 0.40–1.50)
GFR: 60.27 mL/min (ref >60.00)
Glucose, Bld: 100 mg/dL — ABNORMAL HIGH (ref 70–99)
Potassium: 4.4 mEq/L (ref 3.5–5.1)
Sodium: 140 mEq/L (ref 135–145)
Total Bilirubin: 0.8 mg/dL (ref 0.2–1.2)
Total Protein: 6.1 g/dL (ref 6.0–8.3)

## 2020-04-24 LAB — LIPID PANEL
Cholesterol: 129 mg/dL (ref 0–200)
HDL: 59.7 mg/dL
LDL Cholesterol: 56 mg/dL (ref 0–99)
NonHDL: 69.51
Total CHOL/HDL Ratio: 2
Triglycerides: 70 mg/dL (ref 0.0–149.0)
VLDL: 14 mg/dL (ref 0.0–40.0)

## 2020-04-24 LAB — VITAMIN B12: Vitamin B-12: 974 pg/mL — ABNORMAL HIGH (ref 211–911)

## 2020-04-24 LAB — TSH: TSH: 2.2 u[IU]/mL (ref 0.35–4.50)

## 2020-04-24 LAB — HEMOGLOBIN A1C: Hgb A1c MFr Bld: 6.1 % (ref 4.6–6.5)

## 2020-04-24 NOTE — Progress Notes (Signed)
Pre visit review using our clinic review tool, if applicable. No additional management support is needed unless otherwise documented below in the visit note. 

## 2020-04-24 NOTE — Assessment & Plan Note (Signed)
Here for CPX Stroke: Since the last office visit for confusion, saw neurology, MRI show possibly embolic strokes, work-up included a CTA (showed carotid disease) and a echo showed evidence of PFO that subsequently was corrected. Currently asymptomatic Carotid artery disease: Saw vascular surgery 10-2019, stroke probably unrelated to carotid artery.  They are planning to repeat a carotid Doppler CAD: Asymptomatic, continue present care HTN: Currently on no medications, BP today slightly elevated, at home consistently in the 120s over 80. No change, continue monitoring. High cholesterol: On Lipitor, checking labs B12 deficiency: On oral supplements, labs. RTC 6 months

## 2020-04-24 NOTE — Patient Instructions (Addendum)
Check the  blood pressure regularly BP GOAL is between 110/65 and  135/85. If it is consistently higher or lower, let me know  Get your flu shot this fall  GO TO THE LAB : Get the blood work     [place], [place] back for   a checkup in 6 months

## 2020-04-24 NOTE — Assessment & Plan Note (Signed)
-  Td 2012 - Pneumonia shot: 2015 - prevnar: 2016 - s/p shingrix  - s/p covid vaccc - rec flu shot q year  --CCS:  cscope normal 2012, next 10 years  --Cancer screening: DRE normal 04/2018,  PSA wnl 04/2019 --Labs: CMP, FLP, A1c, TSH, B12 - life style: Encouraged to eat healthy and stay active

## 2020-04-24 NOTE — Progress Notes (Signed)
Subjective:    Patient ID: Tyler Ortega, male    DOB: 06/05/1941, 79 y.o.   MRN: [id number]  DOS:  04/24/2020 Type of visit - description: CPX Since the last office visit he is doing well and has no concerns  BP Readings from Last 3 Encounters:  04/24/20 (!) 149/92  01/14/20 124/78  01/05/20 124/70     Review of Systems  Other than above, a 14 point review of systems is negative    Past Medical History:  Diagnosis Date  . Arthritis    osteoarthritis -hips, back pain tx with Prednisone-last dose 09-04-15.  Marland Kitchen CAD (coronary artery disease)    MI 2011, seen @ [name], sees Public librarian (once a year)  . Hyperlipidemia   . Hypertension   . Skin cancer    R calf and the ear (?side)  . Transfusion history    2nd Hip surgery    Past Surgical History:  Procedure Laterality Date  . BUBBLE STUDY  11/18/2019   Procedure: BUBBLE STUDY;  Surgeon: Jerline Pain, MD;  Location: [facility] ENDOSCOPY;  Service: Cardiovascular;;  . COLONOSCOPY  05/29/11   Normal  . CORONARY ARTERY BYPASS GRAFT     05-2010-x4 vessel -[place] Bilateral 2019   cataract  . HIP SURGERY Left    x3 - L hip (initial hip replacement bleed)  . INGUINAL HERNIA REPAIR Left   . PATENT FORAMEN OVALE(PFO) CLOSURE N/A 12/03/2019   Procedure: PATENT FORAMEN OVALE (PFO) CLOSURE;  Surgeon: Sherren Mocha, MD;  Location: [place] CV LAB;  Service: Cardiovascular;  Laterality: N/A;  . TEE WITHOUT CARDIOVERSION N/A 11/18/2019   Procedure: TRANSESOPHAGEAL ECHOCARDIOGRAM (TEE);  Surgeon: Jerline Pain, MD;  Location: [facility] ENDOSCOPY;  Service: Cardiovascular;  Laterality: N/A;  . TOTAL HIP ARTHROPLASTY Right 09/12/2015   Procedure: RIGHT TOTAL HIP ARTHROPLASTY ANTERIOR APPROACH;  Surgeon: Paralee Cancel, MD;  Location: [facility] ORS;  Service: Orthopedics;  Laterality: Right;  Marland Kitchen VASECTOMY      Allergies as of 04/24/2020   No Known Allergies     Medication List       Accurate as of April 24, 2020 10:16 PM.  If you have any questions, ask your nurse or doctor.        STOP taking these medications   clopidogrel 75 MG tablet Commonly known as: PLAVIX Stopped by: [name], MD     TAKE these medications   aspirin EC 81 MG tablet Take 81 mg by mouth daily. Swallow whole.   atorvastatin 80 MG tablet Commonly known as: LIPITOR Take 80 mg by mouth daily.   cholecalciferol 25 MCG (1000 UNIT) tablet Commonly known as: VITAMIN D3 Take 1,000 Units by mouth daily.   multivitamin with minerals Tabs tablet Take 1 tablet by mouth daily.   nitroGLYCERIN 0.4 MG SL tablet Commonly known as: NITROSTAT Place 0.4 mg under the tongue every 5 (five) minutes x 3 doses as needed for chest pain.   vitamin B-12 1000 MCG tablet Commonly known as: CYANOCOBALAMIN Take 1,000 mcg by mouth daily.          Objective:   Physical Exam BP (!) 149/92 (BP Location: Left Arm, Patient Position: Sitting, Cuff Size: Small)   Pulse (!) 48   Temp 98 F (36.7 C) (Oral)   Resp 18   Ht 5\' 8"  (1.727 m)   Wt 172 lb 8 oz (78.2 kg)   SpO2 98%   BMI 26.23 kg/m  General: Well developed,  NAD, BMI noted Neck: No  thyromegaly  HEENT:  Normocephalic . Face symmetric, atraumatic Lungs:  CTA B Normal respiratory effort, no intercostal retractions, no accessory muscle use. Heart: RRR,  no murmur.  Abdomen:  Not distended, soft, non-tender. No rebound or rigidity.   Lower extremities: no pretibial edema bilaterally  Skin: Exposed areas without rash. Not pale. Not jaundice Neurologic:  alert & oriented X3.  Speech normal, gait appropriate for age and unassisted Strength symmetric and appropriate for age.  Psych: Cognition and judgment appear intact.  Cooperative with normal attention span and concentration.  Behavior appropriate. No anxious or depressed appearing.     Assessment     Assessment Hyperglycemia: A1c 6.45/2017 HTN Hyperlipidemia CAD: Dr. Prince Rome --MI and CABG 2011 --[facility]> admitted>  cath  07-2017, had a stent -- PFO closure (11/2019) , dx after a stroke  --Carotid artery dz, see CTA neck 09/2019 GI bleeding: Hg dropped to 4.8 after cath 07/2017, + hematemesis  EGD EG junction ulceration, status post endoscopy treatment (clipped) Transient amnesia: saw neuro. 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 (likely chronic, unrelated finding per neurology); EEG (-) B12 def dx [id number] ABX pre dental work request by ortho d/t hip surgeries    PLAN: Here for CPX Stroke: Since the last office visit for confusion, saw neurology, MRI show possibly embolic strokes, work-up included a CTA (showed carotid disease) and a echo showed evidence of PFO that subsequently was corrected. Currently asymptomatic Carotid artery disease: Saw vascular surgery 10-2019, stroke probably unrelated to carotid artery.  They are planning to repeat a carotid Doppler CAD: Asymptomatic, continue present care HTN: Currently on no medications, BP today slightly elevated, at home consistently in the 120s over 80. No change, continue monitoring. High cholesterol: On Lipitor, checking labs B12 deficiency: On oral supplements, labs. RTC 6 months     This visit occurred during the SARS-CoV-2 public health emergency.  Safety protocols were in place, including screening questions prior to the visit, additional usage of staff PPE, and extensive cleaning of exam room while observing appropriate contact time as indicated for disinfecting solutions.

## 2020-04-27 ENCOUNTER — Other Ambulatory Visit: Payer: Self-pay

## 2020-04-27 DIAGNOSIS — I6521 Occlusion and stenosis of right carotid artery: Secondary | ICD-10-CM

## 2020-05-11 ENCOUNTER — Encounter: Payer: Self-pay | Admitting: Physician Assistant

## 2020-05-11 ENCOUNTER — Ambulatory Visit (INDEPENDENT_AMBULATORY_CARE_PROVIDER_SITE_OTHER): Payer: Medicare Other | Admitting: Physician Assistant

## 2020-05-11 ENCOUNTER — Other Ambulatory Visit: Payer: Self-pay

## 2020-05-11 ENCOUNTER — Ambulatory Visit (HOSPITAL_COMMUNITY)
Admission: RE | Admit: 2020-05-11 | Discharge: 2020-05-11 | Disposition: A | Discharge status: Home / Self Care | Payer: Medicare Other | Source: Ambulatory Visit | Attending: Vascular Surgery | Admitting: Vascular Surgery

## 2020-05-11 VITALS — BP 143/84 | HR 61 | Temp 98.5°F | Resp 20 | Ht 68.0 in | Wt 173.5 lb

## 2020-05-11 DIAGNOSIS — I6521 Occlusion and stenosis of right carotid artery: Secondary | ICD-10-CM | POA: Insufficient documentation

## 2020-05-11 NOTE — Progress Notes (Signed)
History of Present Illness:  Patient is a 79 y.o. year old male who presents for evaluation of carotid stenosis.  He has evidence of old strokes on MRI from December of 2020.  They appeared embolic.  He was started on ASA and Plavix.  He is no longer taking Plavix.  He had a PFO repair by Dr. Burt Knack 12/03/19.    He was last seen by Dr. Donzetta Matters with noted right ICA of 60% stenosis.  He is here today for f/u repeat Duplex and examination.  Risk factors include:   hypertension, hyperlipidemia for vascular disease.  He has had a quadruple bypass several years ago at [facility].  Currently is very active and exercises frequently.    He denise symptoms of CVA or TIA.  Negative for amaurosis, aphasia or weakness of unilateral body.      Past Medical History:  Diagnosis Date  . Arthritis    osteoarthritis -hips, back pain tx with Prednisone-last dose 09-04-15.  Marland Kitchen CAD (coronary artery disease)    MI 2011, seen @ [name], sees Public librarian (once a year)  . Hyperlipidemia   . Hypertension   . Skin cancer    R calf and the ear (?side)  . Transfusion history    2nd Hip surgery    Past Surgical History:  Procedure Laterality Date  . BUBBLE STUDY  11/18/2019   Procedure: BUBBLE STUDY;  Surgeon: Jerline Pain, MD;  Location: [facility] ENDOSCOPY;  Service: Cardiovascular;;  . COLONOSCOPY  05/29/11   Normal  . CORONARY ARTERY BYPASS GRAFT     05-2010-x4 vessel -[place] Bilateral 2019   cataract  . HIP SURGERY Left    x3 - L hip (initial hip replacement bleed)  . INGUINAL HERNIA REPAIR Left   . PATENT FORAMEN OVALE(PFO) CLOSURE N/A 12/03/2019   Procedure: PATENT FORAMEN OVALE (PFO) CLOSURE;  Surgeon: Sherren Mocha, MD;  Location: [place] CV LAB;  Service: Cardiovascular;  Laterality: N/A;  . TEE WITHOUT CARDIOVERSION N/A 11/18/2019   Procedure: TRANSESOPHAGEAL ECHOCARDIOGRAM (TEE);  Surgeon: Jerline Pain, MD;  Location: [facility] ENDOSCOPY;  Service: Cardiovascular;  Laterality:  N/A;  . TOTAL HIP ARTHROPLASTY Right 09/12/2015   Procedure: RIGHT TOTAL HIP ARTHROPLASTY ANTERIOR APPROACH;  Surgeon: Paralee Cancel, MD;  Location: [facility] ORS;  Service: Orthopedics;  Laterality: Right;  Marland Kitchen VASECTOMY       Social History Social History   Tobacco Use  . Smoking status: Never Smoker  . Smokeless tobacco: Never Used  Vaping Use  . Vaping Use: Never used  Substance Use Topics  . Alcohol use: Yes    Comment: rarely, last 6 months ago  . Drug use: No    Family History Family History  Problem Relation Age of Onset  . Coronary artery disease Neg Hx   . Diabetes Neg Hx   . Colon cancer Neg Hx   . Prostate cancer Neg Hx   . Seizures Neg Hx   . Multiple sclerosis Neg Hx   . Migraines Neg Hx   . Dementia Neg Hx     Allergies  No Known Allergies   Current Outpatient Medications  Medication Sig Dispense Refill  . aspirin EC 81 MG tablet Take 81 mg by mouth daily. Swallow whole.    Marland Kitchen atorvastatin (LIPITOR) 80 MG tablet Take 80 mg by mouth daily.    . cholecalciferol (VITAMIN D3) 25 MCG (1000 UT) tablet Take 1,000 Units by mouth daily.    Marland Kitchen  Multiple Vitamin (MULTIVITAMIN WITH MINERALS) TABS tablet Take 1 tablet by mouth daily.    . nitroGLYCERIN (NITROSTAT) 0.4 MG SL tablet Place 0.4 mg under the tongue every 5 (five) minutes x 3 doses as needed for chest pain.     . vitamin B-12 (CYANOCOBALAMIN) 1000 MCG tablet Take 1,000 mcg by mouth daily.      No current facility-administered medications for this visit.   Facility-Administered Medications Ordered in Other Visits  Medication Dose Route Frequency Provider Last Rate Last Admin  . sodium chloride 3% (hypertonic) IV bolus 20 mL  20 mL Intravenous Once Melvenia Beam, MD        ROS:   General:  No weight loss, Fever, chills  HEENT: No recent headaches, no nasal bleeding, no visual changes, no sore throat  Neurologic: No dizziness, blackouts, seizures. No recent symptoms of stroke or mini- stroke. No recent  episodes of slurred speech, or temporary blindness.  Cardiac: No recent episodes of chest pain/pressure, no shortness of breath at rest.  No shortness of breath with exertion.  Denies history of atrial fibrillation or irregular heartbeat  Vascular: No history of rest pain in feet.  No history of claudication.  No history of non-healing ulcer, No history of DVT   Pulmonary: No home oxygen, no productive cough, no hemoptysis,  No asthma or wheezing  Musculoskeletal:  [ ]  Arthritis, [ ]  Low back pain,  [ ]  Joint pain  Hematologic:No history of hypercoagulable state.  No history of easy bleeding.  No history of anemia  Gastrointestinal: No hematochezia or melena,  No gastroesophageal reflux, no trouble swallowing  Urinary: [ ]  chronic Kidney disease, [ ]  on HD - [ ]  MWF or [ ]  TTHS, [ ]  Burning with urination, [ ]  Frequent urination, [ ]  Difficulty urinating;   Skin: No rashes  Psychological: No history of anxiety,  No history of depression   Physical Examination  Vitals:   05/11/20 1019 05/11/20 1021  BP: (!) 142/83 (!) 143/84  Pulse: 61   Resp: 20   Temp: 98.5 F (36.9 C)   TempSrc: Temporal   SpO2: 98%   Weight: 173 lb 8 oz (78.7 kg)   Height: 5\' 8"  (1.727 m)     Body mass index is 26.38 kg/m.  General:  Alert and oriented, no acute distress HEENT: Normal Neck: No bruit or JVD Pulmonary: Clear to auscultation bilaterally Cardiac: Regular Rate and Rhythm without murmur Gastrointestinal: Soft, non-tender, non-distended, no mass, no scars Skin: No rash Extremity Pulses:  2+ radial, brachial, femoral, dorsalis pedis, posterior tibial pulses bilaterally Musculoskeletal: No deformity or edema  Neurologic: Upper and lower extremity motor 5/5 and symmetric  DATA:    Right Carotid Findings:  +----------+--------+--------+--------+---------------------+--------------  ----+       PSV cm/sEDV cm/sStenosisPlaque Description  Comments          +----------+--------+--------+--------+---------------------+--------------  ----+  CCA Prox 61   12                              +----------+--------+--------+--------+---------------------+--------------  ----+  CCA Distal93   21                              +----------+--------+--------+--------+---------------------+--------------  ----+  ICA Prox 95   21   1-39%  heterogenous,    59% area  reduction  irregular and                                 calcific                   +----------+--------+--------+--------+---------------------+--------------  ----+  ICA Distal69   21                              +----------+--------+--------+--------+---------------------+--------------  ----+  ECA    77   9                               +----------+--------+--------+--------+---------------------+--------------  ----+   +----------+--------+-------+----------------+-------------------+       PSV cm/sEDV cmsDescribe    Arm Pressure (mmHG)  +----------+--------+-------+----------------+-------------------+  [id number]       Multiphasic, WNL            +----------+--------+-------+----------------+-------------------+   +---------+--------+--+--------+-+---------+  VertebralPSV cm/s27EDV cm/s8Antegrade  +---------+--------+--+--------+-+---------+      Left Carotid Findings:  +----------+--------+--------+--------+------------------+--------+       PSV cm/sEDV cm/sStenosisPlaque DescriptionComments  +----------+--------+--------+--------+------------------+--------+  CCA Prox 106   16                       +----------+--------+--------+--------+------------------+--------+  CCA Distal83   20                      +----------+--------+--------+--------+------------------+--------+  ICA Prox 52   13   1-39%                 +----------+--------+--------+--------+------------------+--------+  ICA Distal78   22                      +----------+--------+--------+--------+------------------+--------+  ECA    85   15                      +----------+--------+--------+--------+------------------+--------+   +----------+--------+--------+----------------+-------------------+       PSV cm/sEDV cm/sDescribe    Arm Pressure (mmHG)  +----------+--------+--------+----------------+-------------------+  [id number]       Multiphasic, WNL            +----------+--------+--------+----------------+-------------------+   +---------+--------+--+--------+-+---------+  VertebralPSV cm/s28EDV cm/s8Antegrade  +---------+--------+--+--------+-+---------+        Summary:  Right Carotid: Velocities in the right ICA are consistent with a 1-39%  stenosis.   Left Carotid: Velocities in the left ICA are consistent with a 1-39%  stenosis.   ASSESSMENT:  MRI evidence of old infarct with history of short term memory loss The infarct appeared to be embolic and he had a PFO repair by Dr. Burt Knack on 12/03/19. Asymptomatic Carotid stenosis B 1-39%  PLAN: He denise symptoms of stroke or TIA.  He continues to stay very active and denise memory loss events as well.  He will continue to take 81 mg ASA daily. There was a difference in CTA stenosis and duplex today.  I will bring him back in 6 months for repeat carotid duplex.   We reviewed symptoms of stroke and if they occur he will call 911.   Roxy Horseman PA-C [facility]  [place] Office: [phone]  MD in clinic [place]

## 2020-05-12 ENCOUNTER — Other Ambulatory Visit: Payer: Self-pay | Admitting: *Deleted

## 2020-05-12 DIAGNOSIS — I6521 Occlusion and stenosis of right carotid artery: Secondary | ICD-10-CM

## 2020-06-13 ENCOUNTER — Telehealth: Payer: Self-pay | Admitting: Cardiovascular Disease

## 2020-06-13 NOTE — Telephone Encounter (Signed)
Fine if ok with Dr Marlou Porch. Pt is a really nice guy. thx

## 2020-06-13 NOTE — Telephone Encounter (Signed)
Patient is requesting to switch cardiologists from Dr. Prince Rome [facility]) to Dr. Marlou Porch. I see where patient had procedure with Dr. Burt Knack and follow up appointment with Angelena Form earlier this year. Is patient eligible to see Dr. Marlou Porch for general cardiology? Please advise.

## 2020-06-13 NOTE — Telephone Encounter (Signed)
OK with me Kanya Potteiger, MD  

## 2020-06-14 ENCOUNTER — Telehealth: Payer: Self-pay | Admitting: Cardiology

## 2020-06-14 NOTE — Telephone Encounter (Signed)
Pt has been scheduled with Dr Marlou Porch 07/27/2020.

## 2020-06-14 NOTE — Telephone Encounter (Signed)
*  STAT* If patient is at the pharmacy, call can be transferred to refill team.   1. Which medications need to be refilled? (please list name of each medication and dose if known) atorvastatin (LIPITOR) 80 MG tablet  2. Which pharmacy/location (including street and city if local pharmacy) is medication to be sent to? [facility] [place], [place]  3. Do they need a 30 day or 90 day supply? 90 day supply

## 2020-06-14 NOTE — Telephone Encounter (Signed)
Called pt and left message informing him that he needed to contact his PCP for a refill for his medication atorvastatin. [name] refills this medication for the pt. I advised the pt that if he has any other problems, questions or concerns, to give our office a call back.

## 2020-06-26 ENCOUNTER — Other Ambulatory Visit: Payer: Self-pay

## 2020-06-26 ENCOUNTER — Encounter: Payer: Self-pay | Admitting: Internal Medicine

## 2020-06-26 ENCOUNTER — Ambulatory Visit (INDEPENDENT_AMBULATORY_CARE_PROVIDER_SITE_OTHER): Payer: Medicare Other | Admitting: Internal Medicine

## 2020-06-26 VITALS — BP 135/75 | HR 68 | Temp 97.6°F | Resp 16 | Ht 68.0 in | Wt 175.2 lb

## 2020-06-26 DIAGNOSIS — E785 Hyperlipidemia, unspecified: Secondary | ICD-10-CM | POA: Diagnosis not present

## 2020-06-26 MED ORDER — ATORVASTATIN CALCIUM 80 MG PO TABS: 80.0000 mg | ORAL_TABLET | Freq: Every day | ORAL | 3 refills | Status: DC

## 2020-06-26 NOTE — Progress Notes (Signed)
Pre visit review using our clinic review tool, if applicable. No additional management support is needed unless otherwise documented below in the visit note. 

## 2020-06-26 NOTE — Patient Instructions (Addendum)
   GO TO THE FRONT DESK, PLEASE SCHEDULE YOUR APPOINTMENTS Come back for  A check up in 6 months

## 2020-06-26 NOTE — Progress Notes (Signed)
Subjective:    Patient ID: Tyler Ortega, male    DOB: 09-29-1941, 79 y.o.   MRN: [id number]  DOS:  06/26/2020 Type of visit - description: Routine checkup Doing great, denies any problems or symptoms.  Needs Lipitor refill    Review of Systems See above   Past Medical History:  Diagnosis Date  . Arthritis    osteoarthritis -hips, back pain tx with Prednisone-last dose 09-04-15.  Marland Kitchen CAD (coronary artery disease)    MI 2011, seen @ [name], sees Public librarian (once a year)  . Hyperlipidemia   . Hypertension   . Skin cancer    R calf and the ear (?side)  . Transfusion history    2nd Hip surgery    Past Surgical History:  Procedure Laterality Date  . BUBBLE STUDY  11/18/2019   Procedure: BUBBLE STUDY;  Surgeon: Jerline Pain, MD;  Location: [facility] ENDOSCOPY;  Service: Cardiovascular;;  . COLONOSCOPY  05/29/11   Normal  . CORONARY ARTERY BYPASS GRAFT     05-2010-x4 vessel -[place] Bilateral 2019   cataract  . HIP SURGERY Left    x3 - L hip (initial hip replacement bleed)  . INGUINAL HERNIA REPAIR Left   . PATENT FORAMEN OVALE(PFO) CLOSURE N/A 12/03/2019   Procedure: PATENT FORAMEN OVALE (PFO) CLOSURE;  Surgeon: Sherren Mocha, MD;  Location: [place] [facility] LAB;  Service: Cardiovascular;  Laterality: N/A;  . TEE WITHOUT CARDIOVERSION N/A 11/18/2019   Procedure: TRANSESOPHAGEAL ECHOCARDIOGRAM (TEE);  Surgeon: Jerline Pain, MD;  Location: [facility] ENDOSCOPY;  Service: Cardiovascular;  Laterality: N/A;  . TOTAL HIP ARTHROPLASTY Right 09/12/2015   Procedure: RIGHT TOTAL HIP ARTHROPLASTY ANTERIOR APPROACH;  Surgeon: Paralee Cancel, MD;  Location: [facility] ORS;  Service: Orthopedics;  Laterality: Right;  Marland Kitchen VASECTOMY      Allergies as of 06/26/2020   No Known Allergies     Medication List       Accurate as of June 26, 2020 10:46 AM. If you have any questions, ask your nurse or doctor.        aspirin EC 81 MG tablet Take 81 mg by mouth daily. Swallow  whole.   atorvastatin 80 MG tablet Commonly known as: LIPITOR Take 80 mg by mouth daily.   cholecalciferol 25 MCG (1000 UNIT) tablet Commonly known as: VITAMIN D3 Take 1,000 Units by mouth daily.   multivitamin with minerals Tabs tablet Take 1 tablet by mouth daily.   nitroGLYCERIN 0.4 MG SL tablet Commonly known as: NITROSTAT Place 0.4 mg under the tongue every 5 (five) minutes x 3 doses as needed for chest pain.   vitamin B-12 1000 MCG tablet Commonly known as: CYANOCOBALAMIN Take 1,000 mcg by mouth daily.          Objective:   Physical Exam BP 135/75 (BP Location: Left Arm, Patient Position: Sitting, Cuff Size: Small)   Pulse 68   Temp 97.6 F (36.4 C) (Oral)   Resp 16   Ht 5\' 8"  (1.727 m)   Wt 175 lb 4 oz (79.5 kg)   SpO2 98%   BMI 26.65 kg/m   General:   Well developed, NAD, BMI noted. HEENT:  Normocephalic . Face symmetric, atraumatic Lungs:  CTA B Normal respiratory effort, no intercostal retractions, no accessory muscle use. Heart: RRR,  no murmur.  Lower extremities: no pretibial edema bilaterally  Skin: Not pale. Not jaundice Neurologic:  alert & oriented X3.  Speech normal, gait appropriate for age and unassisted  Psych--  Cognition and judgment appear intact.  Cooperative with normal attention span and concentration.  Behavior appropriate. No anxious or depressed appearing.      Assessment     Assessment Hyperglycemia: A1c 6.45/2017 HTN Hyperlipidemia CAD: Dr. Prince Rome --MI and CABG 2011 --[facility]> admitted>  cath 07-2017, had a stent -- PFO closure (11/2019) , dx after a stroke  --Carotid artery dz, see CTA neck 09/2019 GI bleeding: Hg dropped to 4.8 after cath 07/2017, + hematemesis  EGD EG junction ulceration, status post endoscopy treatment (clipped) Transient amnesia: saw neuro. 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 (likely  chronic, unrelated finding per neurology); EEG (-) B12 def dx [id number] ABX pre dental work request by ortho d/t hip surgeries    PLAN: High cholesterol: On Lipitor, last FLP very good.  RF sent Carotid artery disease: Saw vascular 05/11/2020, ultrasound was done, follow-up in 6 months Preventive care: Plans to get high-dose flu shot, plans to get the COVID-19 booster. RTC 6 months   This visit occurred during the SARS-CoV-2 public health emergency.  Safety protocols were in place, including screening questions prior to the visit, additional usage of staff PPE, and extensive cleaning of exam room while observing appropriate contact time as indicated for disinfecting solutions.

## 2020-06-27 NOTE — Assessment & Plan Note (Signed)
High cholesterol: On Lipitor, last FLP very good.  RF sent Carotid artery disease: Saw vascular 05/11/2020, ultrasound was done, follow-up in 6 months Preventive care: Plans to get high-dose flu shot, plans to get the COVID-19 booster. RTC 6 months

## 2020-07-04 DIAGNOSIS — M7542 Impingement syndrome of left shoulder: Secondary | ICD-10-CM | POA: Diagnosis not present

## 2020-07-04 DIAGNOSIS — M7541 Impingement syndrome of right shoulder: Secondary | ICD-10-CM | POA: Diagnosis not present

## 2020-07-04 DIAGNOSIS — M25512 Pain in left shoulder: Secondary | ICD-10-CM | POA: Diagnosis not present

## 2020-07-05 ENCOUNTER — Other Ambulatory Visit: Payer: Self-pay | Admitting: Orthopedic Surgery

## 2020-07-05 DIAGNOSIS — M25512 Pain in left shoulder: Secondary | ICD-10-CM

## 2020-07-25 ENCOUNTER — Ambulatory Visit
Admission: RE | Admit: 2020-07-25 | Discharge: 2020-07-25 | Disposition: A | Discharge status: Home / Self Care | Payer: Medicare Other | Source: Ambulatory Visit | Attending: Orthopedic Surgery | Admitting: Orthopedic Surgery

## 2020-07-25 ENCOUNTER — Other Ambulatory Visit: Payer: Self-pay

## 2020-07-25 DIAGNOSIS — M25512 Pain in left shoulder: Secondary | ICD-10-CM

## 2020-07-25 DIAGNOSIS — M75102 Unspecified rotator cuff tear or rupture of left shoulder, not specified as traumatic: Secondary | ICD-10-CM | POA: Diagnosis not present

## 2020-07-25 DIAGNOSIS — M19012 Primary osteoarthritis, left shoulder: Secondary | ICD-10-CM | POA: Diagnosis not present

## 2020-07-26 ENCOUNTER — Other Ambulatory Visit: Payer: Medicare Other

## 2020-07-27 ENCOUNTER — Ambulatory Visit: Payer: Medicare Other | Admitting: Cardiology

## 2020-07-27 ENCOUNTER — Encounter: Payer: Self-pay | Admitting: Cardiology

## 2020-07-27 ENCOUNTER — Other Ambulatory Visit: Payer: Self-pay

## 2020-07-27 VITALS — BP 100/60 | HR 72 | Ht 68.0 in | Wt 173.0 lb

## 2020-07-27 DIAGNOSIS — I2510 Atherosclerotic heart disease of native coronary artery without angina pectoris: Secondary | ICD-10-CM | POA: Diagnosis not present

## 2020-07-27 DIAGNOSIS — Q211 Atrial septal defect: Secondary | ICD-10-CM | POA: Diagnosis not present

## 2020-07-27 DIAGNOSIS — Q2112 Patent foramen ovale: Secondary | ICD-10-CM

## 2020-07-27 NOTE — Progress Notes (Signed)
Cardiology Office Note:    Date:  07/27/2020   ID:  RICCO DERSHEM, DOB 11/16/1940, MRN [id number]  PCP:  Colon Branch, MD  [place] Cardiologist:  No primary care provider on file.  [facility] Electrophysiologist:  None   Referring MD: Colon Branch, MD     History of Present Illness:    MAXMILLIAN CARSEY is a 79 y.o. male here for new patient visit. Has coronary artery disease with prior myocardial infarction and bypass surgery in 2011.  He was admitted in 2018 with dyspnea on exertion and had a cardiac catheterization with stent placement.  After catheterization had GI bleeding episode with hematemesis.  Hemoglobin dropped to 4.8.  He had EGD performed that showed junctional ulceration and had clipping performed.  PFO closure 2/21, TEE was performed by myself.  Repeat echocardiogram showed no residual shunt.  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  EEG was negative.  He has mild carotid plaque bilaterally less than 39%-/vascular ultrasound reviewed from 05/11/20.  Overall he has been feeling well. Conducts a bible study. Exercises.  Past Medical History:  Diagnosis Date  . Arthritis    osteoarthritis -hips, back pain tx with Prednisone-last dose 09-04-15.  Marland Kitchen CAD (coronary artery disease)    MI 2011, seen @ [name], sees Public librarian (once a year)  . Hyperlipidemia   . Hypertension   . Skin cancer    R calf and the ear (?side)  . Transfusion history    2nd Hip surgery    Past Surgical History:  Procedure Laterality Date  . BUBBLE STUDY  11/18/2019   Procedure: BUBBLE STUDY;  Surgeon: Jerline Pain, MD;  Location: [facility] ENDOSCOPY;  Service: Cardiovascular;;  . COLONOSCOPY  05/29/11   Normal  . CORONARY ARTERY BYPASS GRAFT     05-2010-x4 vessel -[place] Bilateral 2019   cataract  . HIP SURGERY Left    x3 - L hip (initial hip replacement bleed)  . INGUINAL HERNIA REPAIR Left   . PATENT FORAMEN OVALE(PFO) CLOSURE N/A 12/03/2019   Procedure: PATENT FORAMEN OVALE (PFO) CLOSURE;  Surgeon: Sherren Mocha, MD;  Location: [place] CV LAB;  Service: Cardiovascular;  Laterality: N/A;  . TEE WITHOUT CARDIOVERSION N/A 11/18/2019   Procedure: TRANSESOPHAGEAL ECHOCARDIOGRAM (TEE);  Surgeon: Jerline Pain, MD;  Location: [facility] ENDOSCOPY;  Service: Cardiovascular;  Laterality: N/A;  . TOTAL HIP ARTHROPLASTY Right 09/12/2015   Procedure: RIGHT TOTAL HIP ARTHROPLASTY ANTERIOR APPROACH;  Surgeon: Paralee Cancel, MD;  Location: [facility] ORS;  Service: Orthopedics;  Laterality: Right;  Marland Kitchen VASECTOMY      Current Medications: Current Meds  Medication Sig  . aspirin EC 81 MG tablet Take 81 mg by mouth daily. Swallow whole.  Marland Kitchen atorvastatin (LIPITOR) 80 MG tablet Take 1 tablet (80 mg total) by mouth daily.  . cholecalciferol (VITAMIN D3) 25 MCG (1000 UT) tablet Take 1,000 Units by mouth daily.  . Multiple Vitamin (MULTIVITAMIN WITH MINERALS) TABS tablet Take 1 tablet by mouth daily.  . nitroGLYCERIN (NITROSTAT) 0.4 MG SL tablet Place 0.4 mg under the tongue every 5 (five) minutes x 3 doses as needed for chest pain.   . vitamin B-12 (CYANOCOBALAMIN) 1000 MCG tablet Take 1,000 mcg by mouth daily.      Allergies:   Patient  has no known allergies.   Social History   Socioeconomic History  . Marital status: Married    Spouse name: [name]  . Number of children: 3  . Years of education: Some college  . Highest education level: Not on file  Occupational History  . Occupation: Armed forces operational officer, Retired    Fish farm manager: OTHER  Tobacco Use  . Smoking status: Never Smoker  . Smokeless tobacco: Never Used  Vaping Use  . Vaping Use: Never used  Substance and Sexual Activity  . Alcohol use: Yes    Comment: rarely, last 6 months ago  . Drug use: No  . Sexual  activity: Not on file  Other Topics Concern  . Not on file  Social History Narrative   1 Daughter lives in [place], lives w/ wife    Caffeine use: 1-2 cups coffee per day   Soda: diet pepsi a few times a week   Right handed   Social Determinants of Health   Financial Resource Strain: Low Risk   . Difficulty of Paying Living Expenses: Not hard at all  Food Insecurity: No Food Insecurity  . Worried About Charity fundraiser in the Last Year: Never true  . Ran Out of Food in the Last Year: Never true  Transportation Needs: No Transportation Needs  . Lack of Transportation (Medical): No  . Lack of Transportation (Non-Medical): No  Physical Activity:   . Days of Exercise per Week: Not on file  . Minutes of Exercise per Session: Not on file  Stress:   . Feeling of Stress : Not on file  Social Connections:   . Frequency of Communication with Friends and Family: Not on file  . Frequency of Social Gatherings with Friends and Family: Not on file  . Attends Religious Services: Not on file  . Active Member of Clubs or Organizations: Not on file  . Attends Archivist Meetings: Not on file  . Marital Status: Not on file     Family History: The patient's family history is negative for Coronary artery disease, Diabetes, Colon cancer, Prostate cancer, Seizures, Multiple sclerosis, Migraines, and Dementia.  ROS:   Please see the history of present illness.     All other systems reviewed and are negative.  EKGs/Labs/Other Studies Reviewed:     EKG:  EKG is  ordered today.  The ekg ordered today demonstrates sinus rhythm secondary heart block type I with heart rate of 43 bpm back on 11/18/19. Today heart rate was 72.  Recent Labs: 11/15/2019: Hemoglobin 15.3; Platelets 186 04/24/2020: ALT 15; BUN 23; Creatinine, Ser 1.17; Potassium 4.4; Sodium 140; TSH 2.20  Recent Lipid Panel    Component Value Date/Time   CHOL 129 04/24/2020 0833   TRIG 70.0 04/24/2020 0833   HDL 59.70  04/24/2020 0833   CHOLHDL 2 04/24/2020 0833   VLDL 14.0 04/24/2020 0833   LDLCALC 56 04/24/2020 0833     Risk Assessment/Calculations:       Physical Exam:    VS:  BP 100/60   Pulse 72   Ht 5\' 8"  (1.727 m)   Wt 173 lb (78.5 kg)   SpO2 96%   BMI 26.30 kg/m     Wt Readings from Last 3 Encounters:  07/27/20 173 lb (78.5 kg)  06/26/20 175 lb 4 oz (79.5 kg)  05/11/20 173 lb 8 oz (78.7 kg)     GEN:  Well nourished, well developed in no acute distress HEENT: Normal NECK: No JVD; No carotid  bruits LYMPHATICS: No lymphadenopathy CARDIAC: RRR, no murmurs, rubs, gallops RESPIRATORY:  Clear to auscultation without rales, wheezing or rhonchi  ABDOMEN: Soft, non-tender, non-distended MUSCULOSKELETAL:  No edema; No deformity  SKIN: Warm and dry NEUROLOGIC:  Alert and oriented x 3 PSYCHIATRIC:  Normal affect   ASSESSMENT:    1. PFO (patent foramen ovale)   2. Coronary artery disease involving native coronary artery of native heart without angina pectoris    PLAN:    In order of problems listed above:  PFO  -PFO closure -- doing well post ECHO reviewed  CAD --post CABG 2011, stent 2018 --> GI bleed. -Continue with current secondary risk factor prevention with aspirin as well as atorvastatin 80.  Hyperlipidemia -LDL 56 hemoglobin 15.3 creatinine 1.17 ALT 15.  Excellent labs.  Continue with atorvastatin 80.  We will see back in 6 months.    Medication Adjustments/Labs and Tests Ordered: Current medicines are reviewed at length with the patient today.  Concerns regarding medicines are outlined above.  No orders of the defined types were placed in this encounter.  No orders of the defined types were placed in this encounter.   Patient Instructions  Medication Instructions:  The current medical regimen is effective;  continue present plan and medications.  *If you need a refill on your cardiac medications before your next appointment, please call your  pharmacy*  Follow-Up: At [facility], you and your health needs are our priority.  As part of our continuing mission to provide you with exceptional heart care, we have created designated Provider Care Teams.  These Care Teams include your primary Cardiologist (physician) and Advanced Practice Providers (APPs -  Physician Assistants and Nurse Practitioners) who all work together to provide you with the care you need, when you need it.  We recommend signing up for the patient portal called "MyChart".  Sign up information is provided on this After Visit Summary.  MyChart is used to connect with patients for Virtual Visits (Telemedicine).  Patients are able to view lab/test results, encounter notes, upcoming appointments, etc.  Non-urgent messages can be sent to your provider as well.   To learn more about what you can do with MyChart, go to [email or web address].    Your next appointment:   6 month(s)  The format for your next appointment:   In Person  Provider:   Candee Furbish, MD   Thank you for choosing [facility]!!        Signed, Candee Furbish, MD  07/27/2020 5:05 PM    [place]

## 2020-07-27 NOTE — Patient Instructions (Signed)

## 2020-08-08 DIAGNOSIS — M75102 Unspecified rotator cuff tear or rupture of left shoulder, not specified as traumatic: Secondary | ICD-10-CM | POA: Diagnosis not present

## 2020-08-08 DIAGNOSIS — M7542 Impingement syndrome of left shoulder: Secondary | ICD-10-CM | POA: Diagnosis not present

## 2020-08-08 DIAGNOSIS — M25512 Pain in left shoulder: Secondary | ICD-10-CM | POA: Diagnosis not present

## 2020-10-25 ENCOUNTER — Ambulatory Visit: Payer: Medicare Other | Admitting: Internal Medicine

## 2020-11-10 ENCOUNTER — Encounter (HOSPITAL_COMMUNITY): Payer: Medicare Other

## 2020-11-10 ENCOUNTER — Ambulatory Visit: Payer: Medicare Other

## 2020-11-17 ENCOUNTER — Ambulatory Visit (HOSPITAL_COMMUNITY)
Admission: RE | Admit: 2020-11-17 | Discharge: 2020-11-17 | Disposition: A | Discharge status: Home / Self Care | Payer: Medicare Other | Source: Ambulatory Visit | Attending: Vascular Surgery | Admitting: Vascular Surgery

## 2020-11-17 ENCOUNTER — Ambulatory Visit (INDEPENDENT_AMBULATORY_CARE_PROVIDER_SITE_OTHER): Payer: Medicare Other | Admitting: Physician Assistant

## 2020-11-17 ENCOUNTER — Other Ambulatory Visit: Payer: Self-pay

## 2020-11-17 VITALS — BP 132/72 | HR 54 | Temp 98.7°F | Resp 20 | Ht 68.0 in | Wt 174.3 lb

## 2020-11-17 DIAGNOSIS — I6521 Occlusion and stenosis of right carotid artery: Secondary | ICD-10-CM

## 2020-11-17 NOTE — Progress Notes (Signed)
HISTORY AND PHYSICAL     CC:  follow up. Requesting Provider:  Colon Branch, MD  HPI: This is a 80 y.o. male here for follow up for carotid artery stenosis.  He has evidence of old strokes on MRI from December 2020 and appeared embolic.  He was found to have a PFO and this was repaired by Dr. Burt Knack on 12/03/2019.    Pt was last seen 05/11/2020 and at that time he was doing well without stroke sx.  He originally was noted to have a 65% right ICA stenosis on CTA of the neck.  At the last visit, his duplex revealed bilateral 1-39% ICA stenosis.  He was brought back for a 6 month visit to recheck given the discrepancy. .   Pt returns today for follow up.    Pt denies any amaurosis fugax, speech difficulties, weakness, numbness, paralysis or clumsiness or facial droop.    The pt is on a statin for cholesterol management.  The pt is on a daily aspirin.   Other AC:  none The pt is not on medication for hypertension.   The pt is not diabetic.   Tobacco hx:  never  Pt does not have family hx of AAA.  Past Medical History:  Diagnosis Date  . Arthritis    osteoarthritis -hips, back pain tx with Prednisone-last dose 09-04-15.  Marland Kitchen CAD (coronary artery disease)    MI 2011, seen @ [name], sees Public librarian (once a year)  . Hyperlipidemia   . Hypertension   . Skin cancer    R calf and the ear (?side)  . Transfusion history    2nd Hip surgery    Past Surgical History:  Procedure Laterality Date  . BUBBLE STUDY  11/18/2019   Procedure: BUBBLE STUDY;  Surgeon: Jerline Pain, MD;  Location: [facility] ENDOSCOPY;  Service: Cardiovascular;;  . COLONOSCOPY  05/29/11   Normal  . CORONARY ARTERY BYPASS GRAFT     05-2010-x4 vessel [place] Bilateral 2019   cataract  . HIP SURGERY Left    x3 - L hip (initial hip replacement bleed)  . INGUINAL HERNIA REPAIR Left   . PATENT FORAMEN OVALE(PFO) CLOSURE N/A 12/03/2019   Procedure: PATENT FORAMEN OVALE (PFO) CLOSURE;  Surgeon:  Sherren Mocha, MD;  Location: [place] CV LAB;  Service: Cardiovascular;  Laterality: N/A;  . TEE WITHOUT CARDIOVERSION N/A 11/18/2019   Procedure: TRANSESOPHAGEAL ECHOCARDIOGRAM (TEE);  Surgeon: Jerline Pain, MD;  Location: [facility] ENDOSCOPY;  Service: Cardiovascular;  Laterality: N/A;  . TOTAL HIP ARTHROPLASTY Right 09/12/2015   Procedure: RIGHT TOTAL HIP ARTHROPLASTY ANTERIOR APPROACH;  Surgeon: Paralee Cancel, MD;  Location: [facility] ORS;  Service: Orthopedics;  Laterality: Right;  Marland Kitchen VASECTOMY      No Known Allergies  Current Outpatient Medications  Medication Sig Dispense Refill  . aspirin EC 81 MG tablet Take 81 mg by mouth daily. Swallow whole.    Marland Kitchen atorvastatin (LIPITOR) 80 MG tablet Take 1 tablet (80 mg total) by mouth daily. 90 tablet 3  . cholecalciferol (VITAMIN D3) 25 MCG (1000 UT) tablet Take 1,000 Units by mouth daily.    . Multiple Vitamin (MULTIVITAMIN WITH MINERALS) TABS tablet Take 1 tablet by mouth daily.    . nitroGLYCERIN (NITROSTAT) 0.4 MG SL tablet Place 0.4 mg under the tongue every 5 (five) minutes x 3 doses as needed for chest pain.     . vitamin B-12 (CYANOCOBALAMIN) 1000 MCG tablet Take 1,000 mcg by mouth  daily.      No current facility-administered medications for this visit.    Family History  Problem Relation Age of Onset  . Coronary artery disease Neg Hx   . Diabetes Neg Hx   . Colon cancer Neg Hx   . Prostate cancer Neg Hx   . Seizures Neg Hx   . Multiple sclerosis Neg Hx   . Migraines Neg Hx   . Dementia Neg Hx     Social History   Socioeconomic History  . Marital status: Married    Spouse name: [name]  . Number of children: 3  . Years of education: Some college  . Highest education level: Not on file  Occupational History  . Occupation: Armed forces operational officer, Retired    Fish farm manager: OTHER  Tobacco Use  . Smoking status: Never Smoker  . Smokeless tobacco: Never Used  Vaping Use  . Vaping Use: Never used  Substance and Sexual Activity  . Alcohol use:  Yes    Comment: rarely, last 6 months ago  . Drug use: No  . Sexual activity: Not on file  Other Topics Concern  . Not on file  Social History Narrative   1 Daughter lives in [place], lives w/ wife    Caffeine use: 1-2 cups coffee per day   Soda: diet pepsi a few times a week   Right handed   Social Determinants of Health   Financial Resource Strain: Low Risk   . Difficulty of Paying Living Expenses: Not hard at all  Food Insecurity: No Food Insecurity  . Worried About Charity fundraiser in the Last Year: Never true  . Ran Out of Food in the Last Year: Never true  Transportation Needs: No Transportation Needs  . Lack of Transportation (Medical): No  . Lack of Transportation (Non-Medical): No  Physical Activity: Not on file  Stress: Not on file  Social Connections: Not on file  Intimate Partner Violence: Not on file     REVIEW OF SYSTEMS:   [X]  denotes positive finding, [ ]  denotes negative finding Cardiac  Comments:  Chest pain or chest pressure:    Shortness of breath upon exertion:    Short of breath when lying flat:    Irregular heart rhythm:        Vascular    Pain in calf, thigh, or hip brought on by ambulation:    Pain in feet at night that wakes you up from your sleep:     Blood clot in your veins:    Leg swelling:         Pulmonary    Oxygen at home:    Productive cough:     Wheezing:         Neurologic    Sudden weakness in arms or legs:     Sudden numbness in arms or legs:     Sudden onset of difficulty speaking or slurred speech:    Temporary loss of vision in one eye:     Problems with dizziness:         Gastrointestinal    Blood in stool:     Vomited blood:         Genitourinary    Burning when urinating:     Blood in urine:        Psychiatric    Major depression:         Hematologic    Bleeding problems:    Problems with blood clotting too easily:  Skin    Rashes or ulcers:        Constitutional    Fever or chills:       PHYSICAL EXAMINATION:  Today's Vitals   11/17/20 0923 11/17/20 0928  BP: 139/75 132/72  Pulse: (!) 54   Resp: 20   Temp: 98.7 F (37.1 C)   TempSrc: Temporal   SpO2: 98%   Weight: 174 lb 4.8 oz (79.1 kg)   Height: 5\' 8"  (1.727 m)    Body mass index is 26.5 kg/m.   General:  WDWN in NAD; vital signs documented above Gait: Not observed HENT: WNL, normocephalic Pulmonary: normal non-labored breathing Cardiac: regular HR, without carotid bruits Abdomen: soft, NT, no masses; aortic pulse is not palpable Skin: without rashes Vascular Exam/Pulses:  Right Left  Radial 2+ (normal) 2+ (normal)   Extremities: without ischemic changes, without Gangrene , without cellulitis; without open wounds Musculoskeletal: no muscle wasting or atrophy  Neurologic: A&O X 3; moving all extremities equally Psychiatric:  The pt has Normal affect.   Non-Invasive Vascular Imaging:   Carotid Duplex on 11/17/2020: Right:  1-39% ICA stenosis Left:  1-39% ICA stenosis  Previous Carotid duplex on 05/11/2020: Right: 1-39% ICA stenosis Left:   1-39% ICA stenosis    ASSESSMENT/PLAN:: 80 y.o. male here for follow up carotid artery stenosis.  -duplex today reveals 1-39% bilateral ICA stenosis.  Most likely cause of his stroke was PFO and this has been repaired.  Given he did have the discrepancy of the CTA and duplex, I discussed with pt we would bring him back in one year and if he remains 1-39%, we would see him back as needed.  Pt is in agreement.   -discussed s/s of stroke with pt and he understands should he develop any of these sx, he will go to the nearest ER. -pt will f/u in one year with carotid duplex -pt will call sooner should they have any issues. -continue statin/asa  Leontine Locket, [facility] [phone]  Clinic MD:  Donzetta Matters

## 2020-11-20 DIAGNOSIS — L821 Other seborrheic keratosis: Secondary | ICD-10-CM | POA: Diagnosis not present

## 2020-11-20 DIAGNOSIS — L814 Other melanin hyperpigmentation: Secondary | ICD-10-CM | POA: Diagnosis not present

## 2020-11-20 DIAGNOSIS — D225 Melanocytic nevi of trunk: Secondary | ICD-10-CM | POA: Diagnosis not present

## 2020-11-20 DIAGNOSIS — D1801 Hemangioma of skin and subcutaneous tissue: Secondary | ICD-10-CM | POA: Diagnosis not present

## 2020-11-30 ENCOUNTER — Other Ambulatory Visit: Payer: Self-pay

## 2020-11-30 ENCOUNTER — Encounter: Payer: Self-pay | Admitting: Physician Assistant

## 2020-11-30 ENCOUNTER — Ambulatory Visit: Payer: Medicare Other | Admitting: Physician Assistant

## 2020-11-30 ENCOUNTER — Ambulatory Visit (HOSPITAL_COMMUNITY): Payer: Medicare Other | Attending: Cardiology

## 2020-11-30 VITALS — Ht 68.0 in | Wt 174.0 lb

## 2020-11-30 DIAGNOSIS — Z951 Presence of aortocoronary bypass graft: Secondary | ICD-10-CM | POA: Diagnosis not present

## 2020-11-30 DIAGNOSIS — Z8774 Personal history of (corrected) congenital malformations of heart and circulatory system: Secondary | ICD-10-CM | POA: Insufficient documentation

## 2020-11-30 DIAGNOSIS — I252 Old myocardial infarction: Secondary | ICD-10-CM | POA: Diagnosis not present

## 2020-11-30 DIAGNOSIS — Q2112 Patent foramen ovale: Secondary | ICD-10-CM

## 2020-11-30 DIAGNOSIS — I6529 Occlusion and stenosis of unspecified carotid artery: Secondary | ICD-10-CM | POA: Diagnosis not present

## 2020-11-30 DIAGNOSIS — I10 Essential (primary) hypertension: Secondary | ICD-10-CM | POA: Diagnosis not present

## 2020-11-30 DIAGNOSIS — I441 Atrioventricular block, second degree: Secondary | ICD-10-CM | POA: Diagnosis not present

## 2020-11-30 DIAGNOSIS — I2510 Atherosclerotic heart disease of native coronary artery without angina pectoris: Secondary | ICD-10-CM

## 2020-11-30 DIAGNOSIS — E785 Hyperlipidemia, unspecified: Secondary | ICD-10-CM | POA: Diagnosis not present

## 2020-11-30 DIAGNOSIS — Q211 Atrial septal defect: Secondary | ICD-10-CM

## 2020-11-30 LAB — ECHOCARDIOGRAM COMPLETE BUBBLE STUDY
Area-P 1/2: 2.69 cm2
MV M vel: 3.68 m/s
MV Peak grad: 54 mmHg
S' Lateral: 2.8 cm

## 2020-11-30 MED ORDER — SODIUM CHLORIDE 0.9% FLUSH
10.0000 mL | INTRAVENOUS | Status: DC | PRN
  Administered 2020-11-30: 10 mL via INTRAVENOUS

## 2020-11-30 NOTE — Progress Notes (Signed)
[place]                                       Cardiology Office Note    Date:  12/01/2020   ID:  Tyler Ortega, DOB 01-14-1941, MRN [id number]  PCP:  Colon Branch, MD  Cardiologist: Dr Marlou Porch   CC: 1 year s/p PFO closure.   History of Present Illness:  Tyler Ortega is a 80 y.o. male with a history of CAD s/p CABG and PCI, carotid artery stenosis, HTN, HLD and PFO s/p PFO closure (12/03/2019) who presents to clinic for follow up.   He first had an episode of transient amnesia about 4 years ago when he went to the gym. He returned home and didn't remember being there. More recently, he woke up and didn't remember what he was supposed to do that day. He reported significant confusion. He later sought neurology evaluation with [name] and underwent testing that included an echocardiogram. The study demonstrated cardiac function with LVEF 55%. There was no significant valvular disease identified. There is a bubble study completed, demonstrating bubbles in the left heart within 3 beats, suggestive of a PFO. An event monitor showed no evidence of atrial fibrillation or flutter. A CTA of the neck showed a 65% atheromatous stenosis of the right ICA bulb. An MRI of the brain demonstrated new small cerebellar infarcts.  The patient was felt to have evidence of embolic appearing infarcts in the cerebellum and was referred for consideration of PFO closure. TEE confirmed PFO.   He underwent successful transcatheter PFO closure with a 25 mm Amplatzer PFO Occluder on 12/03/19. Post op echo showed noraml EF with no evidence of atrial level shunt (although not well visualized). He was discharged on aspirin and plavix.  Today he presents to clinic for follow up. No CP or SOB. No LE edema, orthopnea or PND. No dizziness or syncope. No blood in stool or urine. No palpitations.  Exercises regularly and stays very active. He says his HR is routinely in the  40s. No new fatigue.   Past Medical History:  Diagnosis Date  . Arthritis    osteoarthritis -hips, back pain tx with Prednisone-last dose 09-04-15.  Marland Kitchen CAD (coronary artery disease)    MI 2011, seen @ [name], sees Public librarian (once a year)  . Hyperlipidemia   . Hypertension   . Skin cancer    R calf and the ear (?side)  . Transfusion history    2nd Hip surgery    Past Surgical History:  Procedure Laterality Date  . BUBBLE STUDY  11/18/2019   Procedure: BUBBLE STUDY;  Surgeon: Jerline Pain, MD;  Location: [facility] ENDOSCOPY;  Service: Cardiovascular;;  . COLONOSCOPY  05/29/11   Normal  . CORONARY ARTERY BYPASS GRAFT     05-2010-x4 vessel -[place] Bilateral 2019   cataract  . HIP SURGERY Left    x3 - L hip (initial hip replacement bleed)  . INGUINAL HERNIA REPAIR Left   . PATENT FORAMEN OVALE(PFO) CLOSURE N/A 12/03/2019   Procedure: PATENT FORAMEN OVALE (PFO) CLOSURE;  Surgeon: Sherren Mocha, MD;  Location: [place] CV LAB;  Service: Cardiovascular;  Laterality: N/A;  . TEE WITHOUT CARDIOVERSION N/A 11/18/2019   Procedure: TRANSESOPHAGEAL ECHOCARDIOGRAM (TEE);  Surgeon: Jerline Pain, MD;  Location: [facility] ENDOSCOPY;  Service:  Cardiovascular;  Laterality: N/A;  . TOTAL HIP ARTHROPLASTY Right 09/12/2015   Procedure: RIGHT TOTAL HIP ARTHROPLASTY ANTERIOR APPROACH;  Surgeon: Paralee Cancel, MD;  Location: [facility] ORS;  Service: Orthopedics;  Laterality: Right;  Marland Kitchen VASECTOMY      Current Medications: Outpatient Medications Prior to Visit  Medication Sig Dispense Refill  . amoxicillin (AMOXIL) 500 MG capsule Take 1,000 mg by mouth as needed. Dental visits    . aspirin EC 81 MG tablet Take 81 mg by mouth daily. Swallow whole.    Marland Kitchen atorvastatin (LIPITOR) 80 MG tablet Take 1 tablet (80 mg total) by mouth daily. 90 tablet 3  . cholecalciferol (VITAMIN D3) 25 MCG (1000 UT) tablet Take 1,000 Units by mouth daily.    . Multiple Vitamin (MULTIVITAMIN WITH MINERALS) TABS  tablet Take 1 tablet by mouth daily.    . nitroGLYCERIN (NITROSTAT) 0.4 MG SL tablet Place 0.4 mg under the tongue every 5 (five) minutes x 3 doses as needed for chest pain.     . vitamin B-12 (CYANOCOBALAMIN) 1000 MCG tablet Take 1,000 mcg by mouth daily.      Facility-Administered Medications Prior to Visit  Medication Dose Route Frequency Provider Last Rate Last Admin  . sodium chloride flush (NS) 0.9 % injection 10 mL  10 mL Intravenous PRN Eileen Stanford, PA-C   10 mL at 11/30/20 1324     Allergies:   Patient has no known allergies.   Social History   Socioeconomic History  . Marital status: Married    Spouse name: [name]  . Number of children: 3  . Years of education: Some college  . Highest education level: Not on file  Occupational History  . Occupation: Armed forces operational officer, Retired    Fish farm manager: OTHER  Tobacco Use  . Smoking status: Never Smoker  . Smokeless tobacco: Never Used  Vaping Use  . Vaping Use: Never used  Substance and Sexual Activity  . Alcohol use: Yes    Comment: rarely, last 6 months ago  . Drug use: No  . Sexual activity: Not on file  Other Topics Concern  . Not on file  Social History Narrative   1 Daughter lives in [place], lives w/ wife    Caffeine use: 1-2 cups coffee per day   Soda: diet pepsi a few times a week   Right handed   Social Determinants of Health   Financial Resource Strain: Low Risk   . Difficulty of Paying Living Expenses: Not hard at all  Food Insecurity: No Food Insecurity  . Worried About Charity fundraiser in the Last Year: Never true  . Ran Out of Food in the Last Year: Never true  Transportation Needs: No Transportation Needs  . Lack of Transportation (Medical): No  . Lack of Transportation (Non-Medical): No  Physical Activity: Not on file  Stress: Not on file  Social Connections: Not on file     Family History:  The patient's family history is not on file.     ROS:   Please see the history of present  illness.    ROS All other systems reviewed and are negative.   PHYSICAL EXAM:   VS:  Ht 5\' 8"  (1.727 m)   Wt 174 lb (78.9 kg)   SpO2 98%   BMI 26.46 kg/m    GEN: Well nourished, well developed, in no acute distress, very healthy appearing HEENT: normal Neck: no JVD or masses Cardiac: RRR; no murmurs, rubs, or gallops,no edema  Respiratory:  clear to auscultation bilaterally, normal work of breathing GI: soft, nontender, nondistended, + BS MS: no deformity or atrophy Skin: warm and dry, no rash Neuro:  Alert and Oriented x 3, Strength and sensation are intact Psych: euthymic mood, full affect   Wt Readings from Last 3 Encounters:  11/30/20 174 lb (78.9 kg)  11/17/20 174 lb 4.8 oz (79.1 kg)  07/27/20 173 lb (78.5 kg)      Studies/Labs Reviewed:   EKG:  EKG is NOT ordered today.  Recent Labs: 04/24/2020: ALT 15; BUN 23; Creatinine, Ser 1.17; Potassium 4.4; Sodium 140; TSH 2.20   Lipid Panel    Component Value Date/Time   CHOL 129 04/24/2020 0833   TRIG 70.0 04/24/2020 0833   HDL 59.70 04/24/2020 0833   CHOLHDL 2 04/24/2020 0833   VLDL 14.0 04/24/2020 0833   LDLCALC 56 04/24/2020 0833    Additional studies/ records that were reviewed today include:   12/03/19 PATENT FORAMEN OVALE (PFO) CLOSURE  Conclusion  Successful transcatheter PFO closure with a 25 mm Amplatzer PFO Occluder under fluoroscopic and intracardiac echo guidance  Recommendations  Antiplatelet/Anticoag Recommend uninterrupted dual antiplatelet therapy with Aspirin 81mg  daily and Clopidogrel 75mg  daily for 3 months.   ___________________  Echo 12/03/19 IMPRESSIONS  1. Left ventricular ejection fraction, by estimation, is 60 to 65%. The left ventricle has normal function. The left ventricle has no regional wall motion abnormalities.  2. Right ventricular systolic function is normal. The right ventricular  size is normal.  3. Left atrial size was mildly dilated.  4. Right atrial size was mildly  dilated.  5. The mitral valve is normal in structure and function. Trivial mitral  valve regurgitation. No evidence of mitral stenosis.  6. The aortic valve is tricuspid. Aortic valve regurgitation is not  visualized. Mild aortic valve sclerosis is present, with no evidence of aortic valve stenosis.  7. The inferior vena cava is normal in size with greater than 50%  respiratory variability, suggesting right atrial pressure of 3 mmHg.   ____________________  Echo 11/30/2020 IMPRESSIONS  1. Left ventricular ejection fraction, by estimation, is 50%. The left  ventricle has mildly decreased function. The left ventricle demonstrates  regional wall motion abnormalities with mild septal hypokinesis. Left  ventricular diastolic parameters are  consistent with Grade I diastolic dysfunction (impaired relaxation).  2. Right ventricular systolic function is mildly reduced. The right  ventricular size is mildly enlarged. There is normal pulmonary artery  systolic pressure. The estimated right ventricular systolic pressure is  95.6 mmHg.  3. The mitral valve is normal in structure. Trivial mitral valve  regurgitation. No evidence of mitral stenosis.  4. The aortic valve is tricuspid. Aortic valve regurgitation is not  visualized. Mild aortic valve sclerosis is present, with no evidence of  aortic valve stenosis.  5. Aortic dilatation noted. There is mild dilatation of the ascending  aorta, measuring 39 mm.  6. Negative bubble study, no evidence for residual PFO. The patient is  s/p PFO closure, PFO closure device well-seated.  7. The inferior vena cava is normal in size with <50% respiratory  variability, suggesting right atrial pressure of 8 mmHg.  8. The patient appears to be in 2nd degree heart block.   ASSESSMENT & PLAN:   PFO s/p PFO closure: echo shows mildly decreased LV function and negative bubble study. Continue on aspirin alone.   CAD s/p CABG: continue on aspirin,  plavix, statin. No chest pain.  Carotid artery disease: most recent dopplers showed  1-39% stenosis.   2nd deg AV block type II: HR 41 today and ECG show Wenckebach. He has a narrow QRS and no symptoms. Still exercises regularly and reports having "more energy than his wife would like". Discussed s/s of HAVB and he will call us immediatly if anything changes.    Medication Adjustments/Labs and Tests Ordered: Current medicines are reviewed at length with the patient today.  Concerns regarding medicines are outlined above.  Medication changes, Labs and Tests ordered today are listed in the Patient Instructions below. Patient Instructions  Medication Instructions:  Your provider recommends that you continue on your current medications as directed. Please refer to the Current Medication list given to you today.   *If you need a refill on your cardiac medications before your next appointment, please call your pharmacy*   Follow-Up: At [facility], you and your health needs are our priority.  As part of our continuing mission to provide you with exceptional heart care, we have created designated Provider Care Teams.  These Care Teams include your primary Cardiologist (physician) and Advanced Practice Providers (APPs -  Physician Assistants and Nurse Practitioners) who all work together to provide you with the care you need, when you need it. Your next appointment:   12 month(s) The format for your next appointment:   In Person Provider:   You may see Dr. Marlou Porch or one of the following Advanced Practice Providers on your designated Care Team:    Kathyrn Drown, NP      Signed, Angelena Form, PA-C  12/01/2020 [facility] [address], [place], [place]  [address] Phone: [phone]; Fax: [phone]

## 2020-11-30 NOTE — Patient Instructions (Signed)
Medication Instructions:  Your provider recommends that you continue on your current medications as directed. Please refer to the Current Medication list given to you today.   *If you need a refill on your cardiac medications before your next appointment, please call your pharmacy*   Follow-Up: At [facility], you and your health needs are our priority.  As part of our continuing mission to provide you with exceptional heart care, we have created designated Provider Care Teams.  These Care Teams include your primary Cardiologist (physician) and Advanced Practice Providers (APPs -  Physician Assistants and Nurse Practitioners) who all work together to provide you with the care you need, when you need it. Your next appointment:   12 month(s) The format for your next appointment:   In Person Provider:   You may see Dr. Marlou Porch or one of the following Advanced Practice Providers on your designated Care Team:    Kathyrn Drown, NP

## 2020-12-28 ENCOUNTER — Ambulatory Visit (INDEPENDENT_AMBULATORY_CARE_PROVIDER_SITE_OTHER): Payer: Medicare Other | Admitting: Internal Medicine

## 2020-12-28 ENCOUNTER — Other Ambulatory Visit: Payer: Self-pay

## 2020-12-28 ENCOUNTER — Encounter: Payer: Self-pay | Admitting: Internal Medicine

## 2020-12-28 VITALS — BP 136/80 | HR 70 | Temp 97.8°F | Resp 16 | Ht 68.0 in | Wt 174.0 lb

## 2020-12-28 DIAGNOSIS — R739 Hyperglycemia, unspecified: Secondary | ICD-10-CM

## 2020-12-28 DIAGNOSIS — I2510 Atherosclerotic heart disease of native coronary artery without angina pectoris: Secondary | ICD-10-CM | POA: Diagnosis not present

## 2020-12-28 DIAGNOSIS — E785 Hyperlipidemia, unspecified: Secondary | ICD-10-CM

## 2020-12-28 DIAGNOSIS — Z1159 Encounter for screening for other viral diseases: Secondary | ICD-10-CM | POA: Diagnosis not present

## 2020-12-28 LAB — CBC WITH DIFFERENTIAL/PLATELET
Basophils Absolute: 0.1 10*3/uL (ref 0.0–0.1)
Basophils Relative: 1.2 % (ref 0.0–3.0)
Eosinophils Absolute: 0.3 10*3/uL (ref 0.0–0.7)
Eosinophils Relative: 6 % — ABNORMAL HIGH (ref 0.0–5.0)
HCT: 43.4 % (ref 39.0–52.0)
Hemoglobin: 14.6 g/dL (ref 13.0–17.0)
Lymphocytes Relative: 22.9 % (ref 12.0–46.0)
Lymphs Abs: 1.2 10*3/uL (ref 0.7–4.0)
MCHC: 33.5 g/dL (ref 30.0–36.0)
MCV: 98.7 fl (ref 78.0–100.0)
Monocytes Absolute: 0.5 10*3/uL (ref 0.1–1.0)
Monocytes Relative: 8.6 % (ref 3.0–12.0)
Neutro Abs: 3.3 10*3/uL (ref 1.4–7.7)
Neutrophils Relative %: 61.3 % (ref 43.0–77.0)
Platelets: 148 10*3/uL — ABNORMAL LOW (ref 150.0–400.0)
RBC: 4.4 Mil/uL (ref 4.22–5.81)
RDW: 14.3 % (ref 11.5–15.5)
WBC: 5.3 10*3/uL (ref 4.0–10.5)

## 2020-12-28 LAB — BASIC METABOLIC PANEL
BUN: 19 mg/dL (ref 6–23)
CO2: 28 mEq/L (ref 19–32)
Calcium: 9.1 mg/dL (ref 8.4–10.5)
Chloride: 107 mEq/L (ref 96–112)
Creatinine, Ser: 1.16 mg/dL (ref 0.40–1.50)
GFR: 60.19 mL/min (ref >60.00)
Glucose, Bld: 100 mg/dL — ABNORMAL HIGH (ref 70–99)
Potassium: 4.3 mEq/L (ref 3.5–5.1)
Sodium: 141 mEq/L (ref 135–145)

## 2020-12-28 LAB — HEMOGLOBIN A1C: Hgb A1c MFr Bld: 6 % (ref 4.6–6.5)

## 2020-12-28 NOTE — Patient Instructions (Addendum)
Check the  blood pressure   BP GOAL is between 110/65 and  135/85. If it is consistently higher or lower, let me know    GO TO THE LAB : Get the blood work     [place], [place] Come back for physical exam in 4 months

## 2020-12-28 NOTE — Progress Notes (Signed)
Subjective:    Patient ID: Tyler Ortega, male    DOB: 1941/03/04, 80 y.o.   MRN: [id number]  DOS:  12/28/2020 Type of visit - description: Follow-up  Since the last office visit saw cardiology and vascular surgery, notes reviewed. he is doing well, good med compliance. He is extremely active without any problems.  Review of Systems See above   Past Medical History:  Diagnosis Date  . Arthritis    osteoarthritis -hips, back pain tx with Prednisone-last dose 09-04-15.  Marland Kitchen CAD (coronary artery disease)    MI 2011, seen @ [name], sees Public librarian (once a year)  . Hyperlipidemia   . Hypertension   . Skin cancer    R calf and the ear (?side)  . Transfusion history    2nd Hip surgery    Past Surgical History:  Procedure Laterality Date  . BUBBLE STUDY  11/18/2019   Procedure: BUBBLE STUDY;  Surgeon: Jerline Pain, MD;  Location: [facility] ENDOSCOPY;  Service: Cardiovascular;;  . COLONOSCOPY  05/29/11   Normal  . CORONARY ARTERY BYPASS GRAFT     05-2010-x4 vessel -[place] Bilateral 2019   cataract  . HIP SURGERY Left    x3 - L hip (initial hip replacement bleed)  . INGUINAL HERNIA REPAIR Left   . PATENT FORAMEN OVALE(PFO) CLOSURE N/A 12/03/2019   Procedure: PATENT FORAMEN OVALE (PFO) CLOSURE;  Surgeon: Sherren Mocha, MD;  Location: [place] CV LAB;  Service: Cardiovascular;  Laterality: N/A;  . TEE WITHOUT CARDIOVERSION N/A 11/18/2019   Procedure: TRANSESOPHAGEAL ECHOCARDIOGRAM (TEE);  Surgeon: Jerline Pain, MD;  Location[facility] ENDOSCOPY;  Service: Cardiovascular;  Laterality: N/A;  . TOTAL HIP ARTHROPLASTY Right 09/12/2015   Procedure: RIGHT TOTAL HIP ARTHROPLASTY ANTERIOR APPROACH;  Surgeon: Paralee Cancel, MD;  Location: [facility] ORS;  Service: Orthopedics;  Laterality: Right;  Marland Kitchen VASECTOMY      Allergies as of 12/28/2020   No Known Allergies     Medication List       Accurate as of December 28, 2020  1:15 PM. If you have any questions, ask your nurse  or doctor.        amoxicillin 500 MG capsule Commonly known as: AMOXIL Take 1,000 mg by mouth as needed. Dental visits   aspirin EC 81 MG tablet Take 81 mg by mouth daily. Swallow whole.   atorvastatin 80 MG tablet Commonly known as: LIPITOR Take 1 tablet (80 mg total) by mouth daily.   cholecalciferol 25 MCG (1000 UNIT) tablet Commonly known as: VITAMIN D3 Take 1,000 Units by mouth daily.   multivitamin with minerals Tabs tablet Take 1 tablet by mouth daily.   nitroGLYCERIN 0.4 MG SL tablet Commonly known as: NITROSTAT Place 0.4 mg under the tongue every 5 (five) minutes x 3 doses as needed for chest pain.   vitamin B-12 1000 MCG tablet Commonly known as: CYANOCOBALAMIN Take 1,000 mcg by mouth daily.          Objective:   Physical Exam BP 136/80 (BP Location: Left Arm, Patient Position: Sitting, Cuff Size: Small)   Pulse 70   Temp 97.8 F (36.6 C) (Oral)   Resp 16   Ht 5\' 8"  (1.727 m)   Wt 174 lb (78.9 kg)   SpO2 97%   BMI 26.46 kg/m  General:   Well developed, NAD, BMI noted. HEENT:  Normocephalic . Face symmetric, atraumatic Lungs:  CTA B Normal respiratory effort, no intercostal retractions, no accessory muscle use.  Heart: RRR,  no murmur.  Lower extremities: no pretibial edema bilaterally  Skin: Not pale. Not jaundice Neurologic:  alert & oriented X3.  Speech normal, gait appropriate for age and unassisted Psych--  Cognition and judgment appear intact.  Cooperative with normal attention span and concentration.  Behavior appropriate. No anxious or depressed appearing.      Assessment     Assessment Hyperglycemia: A1c 6.45/2017 HTN Hyperlipidemia CAD: Dr. Prince Rome --MI and CABG 2011 --DOE> admitted>  cath 07-2017, had a stent --Transient amnesia triggered a w/u: *MRI: strokes noted *ECHO --PFO closure (11/2019) , dx after a stroke   *Carotid artery dz, see CTA neck 09/2019 GI bleeding: Hg dropped to 4.8 after cath 07/2017, + hematemesis   EGD EG junction ulceration endoscopy  >>clipped Transient amnesia: saw neuro.  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 (likely chronic, unrelated finding per neurology); EEG (-) B12 def dx [id number] ABX pre dental work request by ortho d/t hip surgeries    PLAN: Hyperglycemia: Check A1c HTN: BP is very good, on no medications. Hyperlipidemia: On atorvastatin, last FLP very good CAD, PFO closure:   Saw cardiology 11/30/2020, LV Fx mildly decreased,  was noted to be bradycardic but asymptomatic ; has a  second-degree AV block type II.  Check BMP, CBC. Carotid disease: Saw vascular surgery 11/17/2020, next OV w/ carotid  duplex in 1 year Preventive care: ACP documents on file, check hep C  RTC 1 year  This visit occurred during the SARS-CoV-2 public health emergency.  Safety protocols were in place, including screening questions prior to the visit, additional usage of staff PPE, and extensive cleaning of exam room while observing appropriate contact time as indicated for disinfecting solutions.

## 2020-12-28 NOTE — Assessment & Plan Note (Signed)
Hyperglycemia: Check A1c HTN: BP is very good, on no medications. Hyperlipidemia: On atorvastatin, last FLP very good CAD, PFO closure:   Saw cardiology 11/30/2020, LV Fx mildly decreased,  was noted to be bradycardic but asymptomatic ; has a  second-degree AV block type II.  Check BMP, CBC. Carotid disease: Saw vascular surgery 11/17/2020, next OV w/ carotid  duplex in 1 year Preventive care: ACP documents on file, check hep C  RTC 1 year

## 2020-12-29 LAB — HEPATITIS C ANTIBODY
Hepatitis C Ab: NONREACTIVE
SIGNAL TO CUT-OFF: 0.01 (ref <1.00)

## 2021-05-01 ENCOUNTER — Ambulatory Visit (INDEPENDENT_AMBULATORY_CARE_PROVIDER_SITE_OTHER): Payer: Medicare Other | Admitting: Internal Medicine

## 2021-05-01 ENCOUNTER — Encounter: Payer: Self-pay | Admitting: Internal Medicine

## 2021-05-01 ENCOUNTER — Other Ambulatory Visit: Payer: Self-pay

## 2021-05-01 VITALS — BP 122/72 | HR 60 | Temp 97.8°F | Resp 16 | Ht 68.0 in | Wt 170.4 lb

## 2021-05-01 DIAGNOSIS — E538 Deficiency of other specified B group vitamins: Secondary | ICD-10-CM

## 2021-05-01 DIAGNOSIS — R739 Hyperglycemia, unspecified: Secondary | ICD-10-CM

## 2021-05-01 DIAGNOSIS — E785 Hyperlipidemia, unspecified: Secondary | ICD-10-CM

## 2021-05-01 DIAGNOSIS — Z0000 Encounter for general adult medical examination without abnormal findings: Secondary | ICD-10-CM | POA: Diagnosis not present

## 2021-05-01 DIAGNOSIS — I10 Essential (primary) hypertension: Secondary | ICD-10-CM

## 2021-05-01 LAB — COMPREHENSIVE METABOLIC PANEL
ALT: 15 U/L (ref 0–53)
AST: 23 U/L (ref 0–37)
Albumin: 4.1 g/dL (ref 3.5–5.2)
Alkaline Phosphatase: 68 U/L (ref 39–117)
BUN: 22 mg/dL (ref 6–23)
CO2: 28 mEq/L (ref 19–32)
Calcium: 9.2 mg/dL (ref 8.4–10.5)
Chloride: 103 mEq/L (ref 96–112)
Creatinine, Ser: 1.13 mg/dL (ref 0.40–1.50)
GFR: 61.96 mL/min (ref >60.00)
Glucose, Bld: 95 mg/dL (ref 70–99)
Potassium: 4.3 mEq/L (ref 3.5–5.1)
Sodium: 138 mEq/L (ref 135–145)
Total Bilirubin: 0.9 mg/dL (ref 0.2–1.2)
Total Protein: 6.5 g/dL (ref 6.0–8.3)

## 2021-05-01 LAB — CBC WITH DIFFERENTIAL/PLATELET
Basophils Absolute: 0 10*3/uL (ref 0.0–0.1)
Basophils Relative: 0.8 % (ref 0.0–3.0)
Eosinophils Absolute: 0.2 10*3/uL (ref 0.0–0.7)
Eosinophils Relative: 3.6 % (ref 0.0–5.0)
HCT: 43 % (ref 39.0–52.0)
Hemoglobin: 14.4 g/dL (ref 13.0–17.0)
Lymphocytes Relative: 19.4 % (ref 12.0–46.0)
Lymphs Abs: 0.9 10*3/uL (ref 0.7–4.0)
MCHC: 33.4 g/dL (ref 30.0–36.0)
MCV: 99.4 fl (ref 78.0–100.0)
Monocytes Absolute: 0.4 10*3/uL (ref 0.1–1.0)
Monocytes Relative: 7.8 % (ref 3.0–12.0)
Neutro Abs: 3.3 10*3/uL (ref 1.4–7.7)
Neutrophils Relative %: 68.4 % (ref 43.0–77.0)
Platelets: 160 10*3/uL (ref 150.0–400.0)
RBC: 4.33 Mil/uL (ref 4.22–5.81)
RDW: 13.9 % (ref 11.5–15.5)
WBC: 4.8 10*3/uL (ref 4.0–10.5)

## 2021-05-01 LAB — LIPID PANEL
Cholesterol: 135 mg/dL (ref 0–200)
HDL: 59.3 mg/dL (ref >39.00)
LDL Cholesterol: 61 mg/dL (ref 0–99)
NonHDL: 75.83
Total CHOL/HDL Ratio: 2
Triglycerides: 73 mg/dL (ref 0.0–149.0)
VLDL: 14.6 mg/dL (ref 0.0–40.0)

## 2021-05-01 LAB — B12 AND FOLATE PANEL
Folate: >24.4 ng/mL (ref >5.9)
Vitamin B-12: 1431 pg/mL — ABNORMAL HIGH (ref 211–911)

## 2021-05-01 LAB — HEMOGLOBIN A1C: Hgb A1c MFr Bld: 6 % (ref 4.6–6.5)

## 2021-05-01 NOTE — Patient Instructions (Signed)
Check the  blood pressure twice a months BP GOAL is between 110/65 and  135/85. If it is consistently higher or lower, let me know     GO TO THE LAB : Get the blood work     [place], [place] back for a physical exam in 1 year.

## 2021-05-01 NOTE — Progress Notes (Signed)
Subjective:   Tyler Ortega is a 80 y.o. male who presents for Medicare Annual/Subsequent preventive examination.  I connected with Jahree today by telephone and verified that I am speaking with the correct person using two identifiers. Location patient: home Location provider: work Persons participating in the virtual visit: patient, Marine scientist.    I discussed the limitations, risks, security and privacy concerns of performing an evaluation and management service by telephone and the availability of in person appointments. I also discussed with the patient that there may be a patient responsible charge related to this service. The patient expressed understanding and verbally consented to this telephonic visit.    Interactive audio and video telecommunications were attempted between this provider and patient, however failed, due to patient having technical difficulties OR patient did not have access to video capability.  We continued and completed visit with audio only.  Some vital signs may be absent or patient reported.   Time Spent with patient on telephone encounter: 20 minutes   Review of Systems     Cardiac Risk Factors include: advanced age (>1mn, >>57women);male gender;dyslipidemia;hypertension     Objective:    Today's Vitals   05/02/21 0820  Weight: 170 lb (77.1 kg)  Height: '5\' 8"'$  (1.727 m)   Body mass index is 25.85 kg/m.  Advanced Directives 05/02/2021 01/14/2020 12/03/2019 11/18/2019 10/22/2019 09/12/2015 09/12/2015  Does Patient Have a Medical Advance Directive? Yes Yes Yes Yes Yes Yes -  Type of AParamedicof AParklawnLiving will HTaylor SpringsLiving will HHumboldtLiving will [place]Living will [place]oldotnaLiving will Living will -  Does patient want to make changes to medical advance directive? - No - Patient declined No - Patient declined - No - Patient declined No -  Patient declined -  Copy of HMarkin Chart? Yes - validated most recent copy scanned in chart (See row information) No - copy requested No - copy requested No - copy requested - Yes Yes    Current Medications (verified) Outpatient Encounter Medications as of 05/02/2021  Medication Sig   aspirin EC 81 MG tablet Take 81 mg by mouth daily. Swallow whole.   atorvastatin (LIPITOR) 80 MG tablet Take 1 tablet (80 mg total) by mouth daily.   cholecalciferol (VITAMIN D3) 25 MCG (1000 UT) tablet Take 1,000 Units by mouth daily.   Multiple Vitamin (MULTIVITAMIN WITH MINERALS) TABS tablet Take 1 tablet by mouth daily.   vitamin B-12 (CYANOCOBALAMIN) 1000 MCG tablet Take 1,000 mcg by mouth daily.    amoxicillin (AMOXIL) 500 MG capsule Take 1,000 mg by mouth as needed. Dental visits (Patient not taking: No sig reported)   nitroGLYCERIN (NITROSTAT) 0.4 MG SL tablet Place 0.4 mg under the tongue every 5 (five) minutes x 3 doses as needed for chest pain.  (Patient not taking: No sig reported)   Facility-Administered Encounter Medications as of 05/02/2021  Medication   sodium chloride flush (NS) 0.9 % injection 10 mL    Allergies (verified) Patient has no known allergies.   History: Past Medical History:  Diagnosis Date   Arthritis    osteoarthritis -hips, back pain tx with Prednisone-last dose 09-04-15.   CAD (coronary artery disease)    MI 2011, seen @ [facility], sees cPublic librarian(once a year)   Hyperlipidemia    Hypertension    Skin cancer    R calf and the ear (?side)   Transfusion history  2nd Hip surgery   Past Surgical History:  Procedure Laterality Date   BUBBLE STUDY  11/18/2019   Procedure: BUBBLE STUDY;  Surgeon: Jerline Pain, MD;  Location: [place];  Service: Cardiovascular;;   COLONOSCOPY  05/29/11   Normal   CORONARY ARTERY BYPASS GRAFT     05-2010-x4 vessel [facility]   EYE SURGERY Bilateral 2019   cataract   HIP SURGERY Left    x3 - L  hip (initial hip replacement bleed)   INGUINAL HERNIA REPAIR Left    PATENT FORAMEN OVALE(PFO) CLOSURE N/A 12/03/2019   Procedure: PATENT FORAMEN OVALE (PFO) CLOSURE;  Surgeon: Sherren Mocha, MD;  Location: [place] [facility];  Service: Cardiovascular;  Laterality: N/A;   TEE WITHOUT CARDIOVERSION N/A 11/18/2019   Procedure: TRANSESOPHAGEAL ECHOCARDIOGRAM (TEE);  Surgeon: Jerline Pain, MD;  Location: [facility] ENDOSCOPY;  Service: Cardiovascular;  Laterality: N/A;   TOTAL HIP ARTHROPLASTY Right 09/12/2015   Procedure: RIGHT TOTAL HIP ARTHROPLASTY ANTERIOR APPROACH;  Surgeon: Paralee Cancel, MD;  Location[facility];  Service: Orthopedics;  Laterality: Right;   VASECTOMY     Family History  Problem Relation Age of Onset   Coronary artery disease Neg Hx    Diabetes Neg Hx    Colon cancer Neg Hx    Prostate cancer Neg Hx    Seizures Neg Hx    Multiple sclerosis Neg Hx    Migraines Neg Hx    Dementia Neg Hx    Social History   Socioeconomic History   Marital status: Married    Spouse name: [name]   Number of children: 3   Years of education: Some college   Highest education level: Not on file  Occupational History   Occupation: Armed forces operational officer, Retired    Fish farm manager: OTHER  Tobacco Use   Smoking status: Never   Smokeless tobacco: Never  Vaping Use   Vaping Use: Never used  Substance and Sexual Activity   Alcohol use: Yes    Comment: rarely, last 6 months ago   Drug use: No   Sexual activity: Not on file  Other Topics Concern   Not on file  Social History Narrative   1 Daughter lives in [place], lives w/ wife    Caffeine use: 1-2 cups coffee per day   Soda: diet pepsi a few times a week   Right handed   Social Determinants of Health   Financial Resource Strain: Low Risk    Difficulty of Paying Living Expenses: Not hard at all  Food Insecurity: No Food Insecurity   Worried About Charity fundraiser in the Last Year: Never true   Arboriculturist in the Last Year: Never true   Transportation Needs: No Transportation Needs   Lack of Transportation (Medical): No   Lack of Transportation (Non-Medical): No  Physical Activity: Sufficiently Active   Days of Exercise per Week: 6 days   Minutes of Exercise per Session: 60 min  Stress: No Stress Concern Present   Feeling of Stress : Not at all  Social Connections: Socially Integrated   Frequency of Communication with Friends and Family: More than three times a week   Frequency of Social Gatherings with Friends and Family: More than three times a week   Attends Religious Services: More than 4 times per year   Active Member of [employer] or Organizations: Yes   Attends Archivist Meetings: More than 4 times per year   Marital Status: Married    Tobacco  Counseling Counseling given: Not Answered   Clinical Intake:  Pre-visit preparation completed: Yes  Pain : No/denies pain     Nutritional Status: BMI 25 -29 Overweight Nutritional Risks: None Diabetes: No  How often do you need to have someone help you when you read instructions, pamphlets, or other written materials from your doctor or pharmacy?: 1 - Never  Diabetic?No  Interpreter Needed?: No  Information entered by :: Caroleen Hamman LPN   Activities of Daily Living In your present state of health, do you have any difficulty performing the following activities: 05/02/2021 05/01/2021  Hearing? N N  Vision? N N  Difficulty concentrating or making decisions? N N  Walking or climbing stairs? N N  Dressing or bathing? N N  Doing errands, shopping? N N  Preparing Food and eating ? N -  Using the Toilet? N -  In the past six months, have you accidently leaked urine? N -  Do you have problems with loss of bowel control? N -  Managing your Medications? N -  Managing your Finances? N -  Housekeeping or managing your Housekeeping? N -  Some recent data might be hidden    Patient Care Team: Colon Branch, MD as PCP - Cyndia Diver, MD as  PCP - Structural Heart (Cardiology) Powers, Elyse Jarvis, MD as Referring Physician (Cardiology) Melvenia Beam, MD as Consulting Physician (Neurology) Dimas Alexandria, MD as Consulting Physician (Ophthalmology)  Indicate any recent Medical Services you may have received from other than [facility] providers in the past year (date may be approximate).     Assessment:   This is a routine wellness examination for Latron.  Hearing/Vision screen Hearing Screening - Comments:: No issues Vision Screening - Comments:: Reading glasses Last eye exam-2-3 years ago  Dietary issues and exercise activities discussed: Current Exercise Habits: Home exercise routine, Type of exercise: Other - see comments;strength training/weights (golfing), Time (Minutes): 60, Frequency (Times/Week): 6, Weekly Exercise (Minutes/Week): 360, Intensity: Mild, Exercise limited by: None identified   Goals Addressed             This Visit's Progress    DIET - INCREASE WATER INTAKE   On track      Depression Screen PHQ 2/9 Scores 05/02/2021 12/28/2020 01/14/2020 04/22/2019 11/03/2017 09/16/2016 04/19/2016  PHQ - 2 Score 0 0 0 0 0 0 0    Fall Risk Fall Risk  05/02/2021 05/01/2021 12/28/2020 01/14/2020 04/22/2019  Falls in the past year? 0 0 0 0 0  Number falls in past yr: 0 0 0 0 0  Injury with Fall? 0 0 0 0 -  Follow up Falls prevention discussed Falls evaluation completed - Education provided;Falls prevention discussed -    FALL RISK PREVENTION PERTAINING TO THE HOME:  Any stairs in or around the home? Yes  If so, are there any without handrails? No  Home free of loose throw rugs in walkways, pet beds, electrical cords, etc? Yes  Adequate lighting in your home to reduce risk of falls? Yes   ASSISTIVE DEVICES UTILIZED TO PREVENT FALLS:  Life alert? No  Use of a cane, walker or w/c? No  Grab bars in the bathroom? No  Shower chair or bench in shower? No  Elevated toilet seat or a handicapped toilet? No   TIMED UP AND  GO:  Was the test performed? No . Phone visit   Cognitive Function:Normal cognitive status assessed by this Nurse Health Advisor. No abnormalities found.   MMSE - Mini  Mental State Exam 03/05/2016  Orientation to time 4  Orientation to Place 5  Registration 3  Attention/ Calculation 5  Recall 3  Language- name 2 objects 2  Language- repeat 1  Language- follow 3 step command 3  Language- read & follow direction 1  Write a sentence 1  Copy design 1  Total score 29        Immunizations Immunization History  Administered Date(s) Administered   Fluad Quad(high Dose 65+) 05/29/2019   Influenza Split 07/01/2020   Influenza, High Dose Seasonal PF 08/01/2015, 09/16/2016   Influenza-Unspecified 07/07/2014, 08/01/2017, 07/07/2018   PFIZER(Purple Top)SARS-COV-2 Vaccination 10/18/2019, 11/08/2019, 07/01/2020   Pneumococcal Conjugate-13 06/01/2015   Pneumococcal Polysaccharide-23 05/30/2014   Tdap 04/25/2011, 03/14/2018   Zoster Recombinat (Shingrix) 04/26/2019, 07/29/2019    TDAP status: Up to date  Flu Vaccine status: Up to date  Pneumococcal vaccine status: Up to date  Covid-19 vaccine status: Information provided on how to obtain vaccines. Booster due  Qualifies for Shingles Vaccine? No   Zostavax completed No   Shingrix Completed?: Yes  Screening Tests Health Maintenance  Topic Date Due   COVID-19 Vaccine (4 - Booster for [place] series) 12/28/2021 (Originally 09/30/2020)   INFLUENZA VACCINE  05/07/2021   TETANUS/TDAP  03/14/2028   Hepatitis C Screening  Completed   PNA vac Low Risk Adult  Completed   Zoster Vaccines- Shingrix  Completed   HPV VACCINES  Aged Out    Health Maintenance  There are no preventive care reminders to display for this patient.  Colorectal cancer screening: No longer required.   Lung Cancer Screening: (Low Dose CT Chest recommended if Age 43-80 years, 30 pack-year currently smoking OR have quit w/in 15years.) does not qualify.      Additional Screening:  Hepatitis C Screening:  Completed 12/28/2020  Vision Screening: Recommended annual ophthalmology exams for early detection of glaucoma and other disorders of the eye. Is the patient up to date with their annual eye exam?  No  Who is the provider or what is the name of the office in which the patient attends annual eye exams? unknown Patient advised to make an appt  Dental Screening: Recommended annual dental exams for proper oral hygiene  Community Resource Referral / Chronic Care Management: CRR required this visit?  No   CCM required this visit?  No      Plan:     I have personally reviewed and noted the following in the patient's chart:   Medical and social history Use of alcohol, tobacco or illicit drugs  Current medications and supplements including opioid prescriptions. Patient is not currently taking opioid prescriptions. Functional ability and status Nutritional status Physical activity Advanced directives List of other physicians Hospitalizations, surgeries, and ER visits in previous 12 months Vitals Screenings to include cognitive, depression, and falls Referrals and appointments  In addition, I have reviewed and discussed with patient certain preventive protocols, quality metrics, and best practice recommendations. A written personalized care plan for preventive services as well as general preventive health recommendations were provided to patient.   Due to this being a telephonic visit, the after visit summary with patients personalized plan was offered to patient via mail or my-chart. Patient would like to access on my-chart.   Marta Antu, LPN   [id number]  Nurse Health Advisor  Nurse Notes: None

## 2021-05-01 NOTE — Progress Notes (Signed)
Subjective:    Patient ID: Tyler Ortega, male    DOB: March 30, 1941, 80 y.o.   MRN: [id number]  DOS:  05/01/2021 Type of visit - description: CPX Since the last office visit he is doing well. A couple of weeks ago after he went to the gym, developed L lateral chest pain, worse when he rolls over in bed, + TTP at the area, increased with cough. Denies fever, rash, actual injury. He feels better today.   Review of Systems  Other than above, a 14 point review of systems is negative       Past Medical History:  Diagnosis Date   Arthritis    osteoarthritis -hips, back pain tx with Prednisone-last dose 09-04-15.   CAD (coronary artery disease)    MI 2011, seen @ [facility], sees Public librarian (once a year)   Hyperlipidemia    Hypertension    Skin cancer    R calf and the ear (?side)   Transfusion history    2nd Hip surgery    Past Surgical History:  Procedure Laterality Date   BUBBLE STUDY  11/18/2019   Procedure: BUBBLE STUDY;  Surgeon: Jerline Pain, MD;  Location: [place];  Service: Cardiovascular;;   COLONOSCOPY  05/29/11   Normal   CORONARY ARTERY BYPASS GRAFT     05-2010-x4 vessel [facility]   EYE SURGERY Bilateral 2019   cataract   HIP SURGERY Left    x3 - L hip (initial hip replacement bleed)   INGUINAL HERNIA REPAIR Left    PATENT FORAMEN OVALE(PFO) CLOSURE N/A 12/03/2019   Procedure: PATENT FORAMEN OVALE (PFO) CLOSURE;  Surgeon: Sherren Mocha, MD;  Location: [place] [facility];  Service: Cardiovascular;  Laterality: N/A;   TEE WITHOUT CARDIOVERSION N/A 11/18/2019   Procedure: TRANSESOPHAGEAL ECHOCARDIOGRAM (TEE);  Surgeon: Jerline Pain, MD;  Location: [facility] ENDOSCOPY;  Service: Cardiovascular;  Laterality: N/A;   TOTAL HIP ARTHROPLASTY Right 09/12/2015   Procedure: RIGHT TOTAL HIP ARTHROPLASTY ANTERIOR APPROACH;  Surgeon: Paralee Cancel, MD;  Location: [facility];  Service: Orthopedics;  Laterality: Right;   VASECTOMY     Social History   Socioeconomic  History   Marital status: Married    Spouse name: [name]   Number of children: 3   Years of education: Some college   Highest education level: Not on file  Occupational History   Occupation: Armed forces operational officer, Retired    Fish farm manager: OTHER  Tobacco Use   Smoking status: Never   Smokeless tobacco: Never  Vaping Use   Vaping Use: Never used  Substance and Sexual Activity   Alcohol use: Yes    Comment: rarely, last 6 months ago   Drug use: No   Sexual activity: Not on file  Other Topics Concern   Not on file  Social History Narrative   1 Daughter lives in [place], lives w/ wife    Caffeine use: 1-2 cups coffee per day   Soda: diet pepsi a few times a week   Right handed   Social Determinants of Health   Financial Resource Strain: Low Risk    Difficulty of Paying Living Expenses: Not hard at all  Food Insecurity: No Food Insecurity   Worried About Charity fundraiser in the Last Year: Never true   Arboriculturist in the Last Year: Never true  Transportation Needs: No Transportation Needs   Lack of Transportation (Medical): No   Lack of Transportation (Non-Medical): No  Physical Activity: Sufficiently Active  Days of Exercise per Week: 6 days   Minutes of Exercise per Session: 60 min  Stress: No Stress Concern Present   Feeling of Stress : Not at all  Social Connections: Socially Integrated   Frequency of Communication with Friends and Family: More than three times a week   Frequency of Social Gatherings with Friends and Family: More than three times a week   Attends Religious Services: More than 4 times per year   Active Member of [employer] or Organizations: Yes   Attends Music therapist: More than 4 times per year   Marital Status: Married  Human resources officer Violence: Not At Risk   Fear of Current or Ex-Partner: No   Emotionally Abused: No   Physically Abused: No   Sexually Abused: No    Allergies as of 05/01/2021   No Known Allergies      Medication  List        Accurate as of May 01, 2021 11:59 PM. If you have any questions, ask your nurse or doctor.          amoxicillin 500 MG capsule Commonly known as: AMOXIL Take 1,000 mg by mouth as needed. Dental visits   aspirin EC 81 MG tablet Take 81 mg by mouth daily. Swallow whole.   atorvastatin 80 MG tablet Commonly known as: LIPITOR Take 1 tablet (80 mg total) by mouth daily.   cholecalciferol 25 MCG (1000 UNIT) tablet Commonly known as: VITAMIN D3 Take 1,000 Units by mouth daily.   multivitamin with minerals Tabs tablet Take 1 tablet by mouth daily.   nitroGLYCERIN 0.4 MG SL tablet Commonly known as: NITROSTAT Place 0.4 mg under the tongue every 5 (five) minutes x 3 doses as needed for chest pain.   vitamin B-12 1000 MCG tablet Commonly known as: CYANOCOBALAMIN Take 1,000 mcg by mouth daily.           Objective:   Physical Exam BP 122/72 (BP Location: Left Arm, Patient Position: Sitting, Cuff Size: Small)   Pulse 60   Temp 97.8 F (36.6 C) (Oral)   Resp 16   Ht '5\' 8"'$  (1.727 m)   Wt 170 lb 6 oz (77.3 kg)   SpO2 98%   BMI 25.91 kg/m  General: Well developed, NAD, BMI noted Neck: No  thyromegaly  HEENT:  Normocephalic . Face symmetric, atraumatic Lungs:  CTA B Normal respiratory effort, no intercostal retractions, no accessory muscle use. Chest wall: Slightly TTP at the left lateral distal wall. Heart: RRR,  no murmur.  Abdomen:  Not distended, soft, non-tender. No rebound or rigidity.  No organomegaly Lower extremities: no pretibial edema bilaterally  Skin: Exposed areas without rash. Not pale. Not jaundice Neurologic:  alert & oriented X3.  Speech normal, gait appropriate for age and unassisted Strength symmetric and appropriate for age.  Psych: Cognition and judgment appear intact.  Cooperative with normal attention span and concentration.  Behavior appropriate. No anxious or depressed appearing.     Assessment      Assessment Hyperglycemia: A1c 6.45/2017 HTN Hyperlipidemia CAD: Dr. Prince Rome --MI and CABG 2011 --DOE> admitted>  cath 07-2017, had a stent --Transient amnesia triggered a w/u: *MRI: strokes noted *ECHO --PFO closure (11/2019) , dx after a stroke   *Carotid artery dz, see CTA neck 09/2019 GI bleeding: Hg dropped to 4.8 after cath 07/2017, + hematemesis  EGD EG junction ulceration endoscopy  >>clipped Transient amnesia: saw neuro.  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 (likely chronic, unrelated finding per neurology); EEG (-) B12 def dx [id number] ABX pre dental work request by ortho d/t hip surgeries    PLAN: Here for CPX, states he feels great. Hyperglycemia: Check A1c HTN: BP looks great, on no medicines. Hyperlipidemia: Continue atorvastatin, labs. Cardiovascular: Asymptomatic, follow-up by cardiology B12 deficiency: On supplements orally RTC 1 year.   This visit occurred during the SARS-CoV-2 public health emergency.  Safety protocols were in place, including screening questions prior to the visit, additional usage of staff PPE, and extensive cleaning of exam room while observing appropriate contact time as indicated for disinfecting solutions.

## 2021-05-02 ENCOUNTER — Ambulatory Visit (INDEPENDENT_AMBULATORY_CARE_PROVIDER_SITE_OTHER): Payer: Medicare Other

## 2021-05-02 ENCOUNTER — Encounter: Payer: Self-pay | Admitting: Internal Medicine

## 2021-05-02 VITALS — Ht 68.0 in | Wt 170.0 lb

## 2021-05-02 DIAGNOSIS — Z0000 Encounter for general adult medical examination without abnormal findings: Secondary | ICD-10-CM

## 2021-05-02 NOTE — Assessment & Plan Note (Signed)
Here for CPX, states he feels great. Hyperglycemia: Check A1c HTN: BP looks great, on no medicines. Hyperlipidemia: Continue atorvastatin, labs. Cardiovascular: Asymptomatic, follow-up by cardiology B12 deficiency: On supplements orally RTC 1 year.

## 2021-05-02 NOTE — Patient Instructions (Signed)
Tyler Ortega , Thank you for taking time to complete your Medicare Wellness Visit. I appreciate your ongoing commitment to your health goals. Please review the following plan we discussed and let me know if I can assist you in the future.   Screening recommendations/referrals: Colonoscopy: No longer required Recommended yearly ophthalmology/optometry visit for glaucoma screening and checkup. Please make an appt for an eye exam. Recommended yearly dental visit for hygiene and checkup  Vaccinations: Influenza vaccine: Up to date Pneumococcal vaccine: Up to date Tdap vaccine: Up to date-Due-03/14/2028 Shingles vaccine: Completed vaccines   Covid-19: Booster due  Advanced directives: Copy in chart  Conditions/risks identified: See problem list   Next appointment: Follow up in one year for your annual wellness visit. 05/07/2022 @ 8:20  Preventive Care 65 Years and Older, Male Preventive care refers to lifestyle choices and visits with your health care provider that can promote health and wellness. What does preventive care include? A yearly physical exam. This is also called an annual well check. Dental exams once or twice a year. Routine eye exams. Ask your health care provider how often you should have your eyes checked. Personal lifestyle choices, including: Daily care of your teeth and gums. Regular physical activity. Eating a healthy diet. Avoiding tobacco and drug use. Limiting alcohol use. Practicing safe sex. Taking low doses of aspirin every day. Taking vitamin and mineral supplements as recommended by your health care provider. What happens during an annual well check? The services and screenings done by your health care provider during your annual well check will depend on your age, overall health, lifestyle risk factors, and family history of disease. Counseling  Your health care provider may ask you questions about your: Alcohol use. Tobacco use. Drug use. Emotional  well-being. Home and relationship well-being. Sexual activity. Eating habits. History of falls. Memory and ability to understand (cognition). Work and work Statistician. Screening  You may have the following tests or measurements: Height, weight, and BMI. Blood pressure. Lipid and cholesterol levels. These may be checked every 5 years, or more frequently if you are over 79 years old. Skin check. Lung cancer screening. You may have this screening every year starting at age 79 if you have a 30-pack-year history of smoking and currently smoke or have quit within the past 15 years. Fecal occult blood test (FOBT) of the stool. You may have this test every year starting at age 79. Flexible sigmoidoscopy or colonoscopy. You may have a sigmoidoscopy every 5 years or a colonoscopy every 10 years starting at age 79. Prostate cancer screening. Recommendations will vary depending on your family history and other risks. Hepatitis C blood test. Hepatitis B blood test. Sexually transmitted disease (STD) testing. Diabetes screening. This is done by checking your blood sugar (glucose) after you have not eaten for a while (fasting). You may have this done every 1-3 years. Abdominal aortic aneurysm (AAA) screening. You may need this if you are a current or former smoker. Osteoporosis. You may be screened starting at age 79 if you are at high risk. Talk with your health care provider about your test results, treatment options, and if necessary, the need for more tests. Vaccines  Your health care provider may recommend certain vaccines, such as: Influenza vaccine. This is recommended every year. Tetanus, diphtheria, and acellular pertussis (Tdap, Td) vaccine. You may need a Td booster every 10 years. Zoster vaccine. You may need this after age 79. Pneumococcal 13-valent conjugate (PCV13) vaccine. One dose is recommended after age  79. Pneumococcal polysaccharide (PPSV23) vaccine. One dose is recommended after  age 79. Talk to your health care provider about which screenings and vaccines you need and how often you need them. This information is not intended to replace advice given to you by your health care provider. Make sure you discuss any questions you have with your health care provider. Document Released: 10/20/2015 Document Revised: 06/12/2016 Document Reviewed: 07/25/2015 Elsevier Interactive Patient Education  2017 [place] Prevention in the Home Falls can cause injuries. They can happen to people of all ages. There are many things you can do to make your home safe and to help prevent falls. What can I do on the outside of my home? Regularly fix the edges of walkways and driveways and fix any cracks. Remove anything that might make you trip as you walk through a door, such as a raised step or threshold. Trim any bushes or trees on the path to your home. Use bright outdoor lighting. Clear any walking paths of anything that might make someone trip, such as rocks or tools. Regularly check to see if handrails are loose or broken. Make sure that both sides of any steps have handrails. Any raised decks and porches should have guardrails on the edges. Have any leaves, snow, or ice cleared regularly. Use sand or salt on walking paths during winter. Clean up any spills in your garage right away. This includes oil or grease spills. What can I do in the bathroom? Use night lights. Install grab bars by the toilet and in the tub and shower. Do not use towel bars as grab bars. Use non-skid mats or decals in the tub or shower. If you need to sit down in the shower, use a plastic, non-slip stool. Keep the floor dry. Clean up any water that spills on the floor as soon as it happens. Remove soap buildup in the tub or shower regularly. Attach bath mats securely with double-sided non-slip rug tape. Do not have throw rugs and other things on the floor that can make you trip. What can I do in the  bedroom? Use night lights. Make sure that you have a light by your bed that is easy to reach. Do not use any sheets or blankets that are too big for your bed. They should not hang down onto the floor. Have a firm chair that has side arms. You can use this for support while you get dressed. Do not have throw rugs and other things on the floor that can make you trip. What can I do in the kitchen? Clean up any spills right away. Avoid walking on wet floors. Keep items that you use a lot in easy-to-reach places. If you need to reach something above you, use a strong step stool that has a grab bar. Keep electrical cords out of the way. Do not use floor polish or wax that makes floors slippery. If you must use wax, use non-skid floor wax. Do not have throw rugs and other things on the floor that can make you trip. What can I do with my stairs? Do not leave any items on the stairs. Make sure that there are handrails on both sides of the stairs and use them. Fix handrails that are broken or loose. Make sure that handrails are as long as the stairways. Check any carpeting to make sure that it is firmly attached to the stairs. Fix any carpet that is loose or worn. Avoid having throw rugs at  the top or bottom of the stairs. If you do have throw rugs, attach them to the floor with carpet tape. Make sure that you have a light switch at the top of the stairs and the bottom of the stairs. If you do not have them, ask someone to add them for you. What else can I do to help prevent falls? Wear shoes that: Do not have high heels. Have rubber bottoms. Are comfortable and fit you well. Are closed at the toe. Do not wear sandals. If you use a stepladder: Make sure that it is fully opened. Do not climb a closed stepladder. Make sure that both sides of the stepladder are locked into place. Ask someone to hold it for you, if possible. Clearly mark and make sure that you can see: Any grab bars or  handrails. First and last steps. Where the edge of each step is. Use tools that help you move around (mobility aids) if they are needed. These include: Canes. Walkers. Scooters. Crutches. Turn on the lights when you go into a dark area. Replace any light bulbs as soon as they burn out. Set up your furniture so you have a clear path. Avoid moving your furniture around. If any of your floors are uneven, fix them. If there are any pets around you, be aware of where they are. Review your medicines with your doctor. Some medicines can make you feel dizzy. This can increase your chance of falling. Ask your doctor what other things that you can do to help prevent falls. This information is not intended to replace advice given to you by your health care provider. Make sure you discuss any questions you have with your health care provider. Document Released: 07/20/2009 Document Revised: 02/29/2016 Document Reviewed: 10/28/2014 Elsevier Interactive Patient Education  2017 [employer].

## 2021-05-02 NOTE — Assessment & Plan Note (Signed)
-  Td 2019 - Pneumonia shot: 2015;  prevnar: 2016 - s/p shingrix - s/p covid vax x 3, booster rec - rec flu shot q year --CCS:  cscope normal 2012, next 10 years.  Pro cons of further screening discussed, elected not to proceed which is within guidelines --Cancer screening: All previous PSAs normal,Pro cons of further screening discussed, elected not to proceed which is within guidelines --Labs: CMP, CBC, FLP, A1c, B12 - life style: Very active physically and mentally. - POA on file

## 2021-05-18 ENCOUNTER — Encounter: Payer: Self-pay | Admitting: Internal Medicine

## 2021-07-16 ENCOUNTER — Other Ambulatory Visit: Payer: Self-pay | Admitting: Internal Medicine

## 2021-10-23 ENCOUNTER — Ambulatory Visit (INDEPENDENT_AMBULATORY_CARE_PROVIDER_SITE_OTHER): Payer: Medicare Other | Admitting: Internal Medicine

## 2021-10-23 ENCOUNTER — Encounter: Payer: Self-pay | Admitting: Internal Medicine

## 2021-10-23 VITALS — BP 126/62 | HR 65 | Temp 98.0°F | Resp 16 | Ht 68.0 in | Wt 169.0 lb

## 2021-10-23 DIAGNOSIS — S59911A Unspecified injury of right forearm, initial encounter: Secondary | ICD-10-CM | POA: Diagnosis not present

## 2021-10-23 DIAGNOSIS — S66911A Strain of unspecified muscle, fascia and tendon at wrist and hand level, right hand, initial encounter: Secondary | ICD-10-CM

## 2021-10-23 NOTE — Progress Notes (Signed)
Subjective:    Patient ID: Tyler Ortega, male    DOB: 04-01-1941, 81 y.o.   MRN: [id number]  DOS:  10/23/2021 Type of visit - description: Acute  Injury happened a week ago: Was sleeping, he turned around, in the process he turned his right wrist and immediately got an acute pain at the palmar aspect of the forearm. The next day, the forearm was swollen and bruised.. Pain was worse when he used the first and second R fingers.  At this point pain and swelling are better but not completely gone. Denies any numbness on the upper extremities  Review of Systems See above   Past Medical History:  Diagnosis Date   Arthritis    osteoarthritis -hips, back pain tx with Prednisone-last dose 09-04-15.   CAD (coronary artery disease)    MI 2011, seen @ [facility], sees Public librarian (once a year)   Hyperlipidemia    Hypertension    Skin cancer    R calf and the ear (?side)   Transfusion history    2nd Hip surgery    Past Surgical History:  Procedure Laterality Date   BUBBLE STUDY  11/18/2019   Procedure: BUBBLE STUDY;  Surgeon: Jerline Pain, MD;  Location: [place];  Service: Cardiovascular;;   COLONOSCOPY  05/29/11   Normal   CORONARY ARTERY BYPASS GRAFT     05-2010-x4 vessel [facility]   EYE SURGERY Bilateral 2019   cataract   HIP SURGERY Left    x3 - L hip (initial hip replacement bleed)   INGUINAL HERNIA REPAIR Left    PATENT FORAMEN OVALE(PFO) CLOSURE N/A 12/03/2019   Procedure: PATENT FORAMEN OVALE (PFO) CLOSURE;  Surgeon: Sherren Mocha, MD;  Location: [place] [facility];  Service: Cardiovascular;  Laterality: N/A;   TEE WITHOUT CARDIOVERSION N/A 11/18/2019   Procedure: TRANSESOPHAGEAL ECHOCARDIOGRAM (TEE);  Surgeon: Jerline Pain, MD;  Location: [facility] ENDOSCOPY;  Service: Cardiovascular;  Laterality: N/A;   TOTAL HIP ARTHROPLASTY Right 09/12/2015   Procedure: RIGHT TOTAL HIP ARTHROPLASTY ANTERIOR APPROACH;  Surgeon: Paralee Cancel, MD;  Location: [facility];  Service:  Orthopedics;  Laterality: Right;   VASECTOMY      Current Outpatient Medications  Medication Instructions   amoxicillin (AMOXIL) 1,000 mg, As needed   aspirin EC 81 mg, Oral, Daily, Swallow whole.    atorvastatin (LIPITOR) 80 MG tablet Take 1 tablet by mouth once daily   cholecalciferol (VITAMIN D3) 1,000 Units, Daily   Multiple Vitamin (MULTIVITAMIN WITH MINERALS) TABS tablet 1 tablet, Oral, Daily   nitroGLYCERIN (NITROSTAT) 0.4 mg, Every 5 min x3 PRN   vitamin B-12 (CYANOCOBALAMIN) 1,000 mcg, Oral, Daily       Objective:   Physical Exam BP 126/62 (BP Location: Left Arm, Patient Position: Sitting, Cuff Size: Small)    Pulse 65    Temp 98 F (36.7 C) (Oral)    Resp 16    Ht 5\' 8"  (1.727 m)    Wt 169 lb (76.7 kg)    SpO2 98%    BMI 25.70 kg/m  General:   Well developed, NAD, BMI noted. HEENT:  Normocephalic . Face symmetric, atraumatic Left arm: Normal Right arm: Fingers normal to inspection and palpation. Wrist: No swelling, range of motion normal, no TTP. Forearm, palmar aspect: + swelling, bruise, slt TTP R fingers: strength wnl, ++ pain w/ 2nd finger flexion Vascular exam: Intact bilaterally arms Skin: Not pale. Not jaundice Neurologic:  alert & oriented X3.  Speech normal, gait appropriate for  age and unassisted Psych--  Cognition and judgment appear intact.  Cooperative with normal attention span and concentration.  Behavior appropriate. No anxious or depressed appearing.       Assessment    Assessment Hyperglycemia: A1c 6.45/2017 HTN Hyperlipidemia CAD: Dr. Prince Rome --MI and CABG 2011 --DOE> admitted>  cath 07-2017, had a stent --Transient amnesia triggered a w/u: *MRI: strokes noted *ECHO --PFO closure (11/2019) , dx after a stroke   *Carotid artery dz, see CTA neck 09/2019 GI bleeding: Hg dropped to 4.8 after cath 07/2017, + hematemesis  EGD EG junction ulceration endoscopy  >>clipped Transient amnesia: saw neuro.  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 (likely chronic, unrelated finding per neurology); EEG (-) B12 def dx [id number] ABX pre dental work request by ortho d/t hip surgeries    PLAN: Injury, forearm: Injury as described above at the right forearm, suspect partial tendon tear ? Vascularly intact.  Motor intact.  Refer to Ortho. Also Tylenol, ice, buddy tape fingers.     This visit occurred during the SARS-CoV-2 public health emergency.  Safety protocols were in place, including screening questions prior to the visit, additional usage of staff PPE, and extensive cleaning of exam room while observing appropriate contact time as indicated for disinfecting solutions.

## 2021-10-23 NOTE — Patient Instructions (Signed)
Tylenol  500 mg OTC 2 tabs a day every 8 hours as needed for pain  ICE  Tape the 3 fingers  Call DR Alvan Dame office

## 2021-10-23 NOTE — Assessment & Plan Note (Signed)
Injury, forearm: Injury as described above at the right forearm, suspect partial tendon tear ? Vascularly intact.  Motor intact.  Refer to Ortho. Also Tylenol, ice, buddy tape fingers.

## 2021-10-24 DIAGNOSIS — M25531 Pain in right wrist: Secondary | ICD-10-CM | POA: Diagnosis not present

## 2021-10-24 DIAGNOSIS — M79601 Pain in right arm: Secondary | ICD-10-CM | POA: Diagnosis not present

## 2021-11-15 ENCOUNTER — Other Ambulatory Visit: Payer: Self-pay

## 2021-11-15 DIAGNOSIS — I6521 Occlusion and stenosis of right carotid artery: Secondary | ICD-10-CM

## 2021-11-19 NOTE — Progress Notes (Signed)
HISTORY AND PHYSICAL     CC:  follow up. Requesting Provider:  Colon Branch, MD  HPI: This is a 81 y.o. male here for follow up for carotid artery stenosis.  He has evidence of old strokes on MRI from December 2020 and appeared embolic.  He was found to have a PFO and this was repaired by Dr. Burt Knack on 12/03/2019.   He originally was noted to have a 65% right ICA stenosis on CTA of the neck.  At the last visit, his duplex revealed bilateral 1-39% ICA stenosis.  Pt was last seen 11/17/2020 and at that time pt was asymptomatic and duplex was 1-39% bilateral ICA stenosis.  It was felt that the most likely cause of his stroke was PFO and that has been repaired.  Given the discrepancy in the duplex and CT scan, it was felt we would bring him back in a year and if the duplex reveals 1-39% bilateral ICA stenosis that he could follow up with [place] as needed.    Pt returns today for follow up.    Pt denies any amaurosis fugax, speech difficulties, weakness, numbness, paralysis or clumsiness or facial droop.  Overall, he is doing well.    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  The pt is on a statin for cholesterol management.  The pt is on a daily aspirin.   Other AC:  none The pt is not on medication for hypertension.   The pt is not diabetic.   Tobacco hx:  never  Pt does not have family hx of AAA.  Past Medical History:  Diagnosis Date   Arthritis    osteoarthritis -hips, back pain tx with Prednisone-last dose 09-04-15.   CAD (coronary artery disease)    MI 2011, seen @ [facility], sees Public librarian (once a year)   Hyperlipidemia    Hypertension    Skin cancer    R calf and the ear (?side)   Transfusion history    2nd Hip surgery    Past Surgical History:  Procedure Laterality Date   BUBBLE STUDY  11/18/2019   Procedure: BUBBLE STUDY;  Surgeon: Jerline Pain, MD;  Location: [place];  Service: Cardiovascular;;    COLONOSCOPY  05/29/11   Normal   CORONARY ARTERY BYPASS GRAFT     05-2010-x4 vessel [facility]   EYE SURGERY Bilateral 2019   cataract   HIP SURGERY Left    x3 - L hip (initial hip replacement bleed)   INGUINAL HERNIA REPAIR Left    PATENT FORAMEN OVALE(PFO) CLOSURE N/A 12/03/2019   Procedure: PATENT FORAMEN OVALE (PFO) CLOSURE;  Surgeon: Sherren Mocha, MD;  Location: [place] [facility];  Service: Cardiovascular;  Laterality: N/A;   TEE WITHOUT CARDIOVERSION N/A 11/18/2019   Procedure: TRANSESOPHAGEAL ECHOCARDIOGRAM (TEE);  Surgeon: Jerline Pain, MD;  [facility] ENDOSCOPY;  Service: Cardiovascular;  Laterality: N/A;   TOTAL HIP ARTHROPLASTY Right 09/12/2015   Procedure: RIGHT TOTAL HIP ARTHROPLASTY ANTERIOR APPROACH;  Surgeon: Paralee Cancel, MD;  Location: [facility];  Service: Orthopedics;  Laterality: Right;   VASECTOMY      No Known Allergies  Current Outpatient Medications  Medication Sig Dispense Refill   amoxicillin (AMOXIL) 500 MG capsule Take 1,000 mg by mouth as needed. Dental visits (Patient not taking: Reported on 12/28/2020)     aspirin EC 81 MG tablet Take 81 mg by mouth daily. Swallow whole.  atorvastatin (LIPITOR) 80 MG tablet Take 1 tablet by mouth once daily 90 tablet 2   cholecalciferol (VITAMIN D3) 25 MCG (1000 UT) tablet Take 1,000 Units by mouth daily. (Patient not taking: Reported on 10/23/2021)     Multiple Vitamin (MULTIVITAMIN WITH MINERALS) TABS tablet Take 1 tablet by mouth daily.     nitroGLYCERIN (NITROSTAT) 0.4 MG SL tablet Place 0.4 mg under the tongue every 5 (five) minutes x 3 doses as needed for chest pain.  (Patient not taking: Reported on 12/28/2020)     vitamin B-12 (CYANOCOBALAMIN) 1000 MCG tablet Take 1,000 mcg by mouth daily.      No current facility-administered medications for this visit.   Facility-Administered Medications Ordered in Other Visits  Medication Dose Route Frequency Provider Last Rate Last Admin   sodium chloride flush (NS)  0.9 % injection 10 mL  10 mL Intravenous PRN Angelena Form R, PA-C   10 mL at 11/30/20 1324    Family History  Problem Relation Age of Onset   Coronary artery disease Neg Hx    Diabetes Neg Hx    Colon cancer Neg Hx    Prostate cancer Neg Hx    Seizures Neg Hx    Multiple sclerosis Neg Hx    Migraines Neg Hx    Dementia Neg Hx     Social History   Socioeconomic History   Marital status: Married    Spouse name: [name]   Number of children: 3   Years of education: Some college   Highest education level: Not on file  Occupational History   Occupation: Armed forces operational officer, Retired    Fish farm manager: OTHER  Tobacco Use   Smoking status: Never   Smokeless tobacco: Never  Vaping Use   Vaping Use: Never used  Substance and Sexual Activity   Alcohol use: Yes    Comment: rarely, last 6 months ago   Drug use: No   Sexual activity: Not on file  Other Topics Concern   Not on file  Social History Narrative   1 Daughter lives in [place], lives w/ wife    Caffeine use: 1-2 cups coffee per day   Soda: diet pepsi a few times a week   Right handed   Social Determinants of Health   Financial Resource Strain: Low Risk    Difficulty of Paying Living Expenses: Not hard at all  Food Insecurity: No Food Insecurity   Worried About Charity fundraiser in the Last Year: Never true   Arboriculturist in the Last Year: Never true  Transportation Needs: No Transportation Needs   Lack of Transportation (Medical): No   Lack of Transportation (Non-Medical): No  Physical Activity: Sufficiently Active   Days of Exercise per Week: 6 days   Minutes of Exercise per Session: 60 min  Stress: No Stress Concern Present   Feeling of Stress : Not at all  Social Connections: Socially Integrated   Frequency of Communication with Friends and Family: More than three times a week   Frequency of Social Gatherings with Friends and Family: More than three times a week   Attends Religious Services: More than 4  times per year   Active Member of [employer] or Organizations: Yes   Attends Music therapist: More than 4 times per year   Marital Status: Married  Human resources officer Violence: Not At Risk   Fear of Current or Ex-Partner: No   Emotionally Abused: No   Physically Abused: No   Sexually  Abused: No     REVIEW OF SYSTEMS:   [X]  denotes positive finding, [ ]  denotes negative finding Cardiac  Comments:  Chest pain or chest pressure:    Shortness of breath upon exertion:    Short of breath when lying flat:    Irregular heart rhythm:        Vascular    Pain in calf, thigh, or hip brought on by ambulation:    Pain in feet at night that wakes you up from your sleep:     Blood clot in your veins:    Leg swelling:         Pulmonary    Oxygen at home:    Productive cough:     Wheezing:         Neurologic    Sudden weakness in arms or legs:     Sudden numbness in arms or legs:     Sudden onset of difficulty speaking or slurred speech:    Temporary loss of vision in one eye:     Problems with dizziness:         Gastrointestinal    Blood in stool:     Vomited blood:         Genitourinary    Burning when urinating:     Blood in urine:        Psychiatric    Major depression:         Hematologic    Bleeding problems:    Problems with blood clotting too easily:        Skin    Rashes or ulcers:        Constitutional    Fever or chills:      PHYSICAL EXAMINATION:  Today's Vitals   11/21/21 0909 11/21/21 0912  BP: (!) 156/73   Pulse: 60   Resp: 16   Temp: (!) 97.2 F (36.2 C)   TempSrc: Temporal   SpO2: 99%   Weight: 170 lb 8 oz (77.3 kg)   Height: 5\' 8"  (1.727 m)   PainSc: 0-No pain 0-No pain   Body mass index is 25.92 kg/m.   General:  WDWN in NAD; vital signs documented above Gait: Not observed HENT: WNL, normocephalic Pulmonary: normal non-labored breathing Cardiac: regular HR, without carotid bruits Abdomen: soft, NT; aortic pulse is not  palpable Skin: without rashes Vascular Exam/Pulses:  Right Left  Radial 2+ (normal) 2+ (normal)   Extremities: without ischemic changes, without Gangrene , without cellulitis; without open wounds Musculoskeletal: no muscle wasting or atrophy  Neurologic: A&O X 3; moving all extremities equally; speech is fluent/normal Psychiatric:  The pt has Normal affect.   Non-Invasive Vascular Imaging:   Carotid Duplex on 11/21/2021: Right:  1-39% ICA stenosis Left:  1-39% ICA stenosis Vertebrals:  Bilateral vertebral arteries demonstrate antegrade flow.  Subclavians: Normal flow hemodynamics were seen in bilateral subclavian arteries.  Previous Carotid duplex on 11/17/2020: Right: 1-39% ICA stenosis Left:   1-39% ICA stenosis    ASSESSMENT/PLAN:: 81 y.o. male here for follow up carotid artery stenosis   -duplex today reveals the bilateral carotid artery stenosis remains stable in the 1-39% category. The vertebrals have antegrade flow and normal hemodynamics were seen in the bilateral SCA's.   -discussed with pt that given his carotid duplex has been stable for several years and he has remained asymptomatic, he will follow up as needed.  He knows to call if he has any issues.  -continue statin/asa  Leontine Locket, [facility] [phone]  Clinic MD:  Donzetta Matters

## 2021-11-21 ENCOUNTER — Other Ambulatory Visit: Payer: Self-pay

## 2021-11-21 ENCOUNTER — Encounter: Payer: Self-pay | Admitting: Physician Assistant

## 2021-11-21 ENCOUNTER — Ambulatory Visit (HOSPITAL_COMMUNITY)
Admission: RE | Admit: 2021-11-21 | Discharge: 2021-11-21 | Disposition: A | Discharge status: Home / Self Care | Payer: Medicare Other | Source: Ambulatory Visit | Attending: Vascular Surgery | Admitting: Vascular Surgery

## 2021-11-21 ENCOUNTER — Ambulatory Visit: Payer: Medicare Other | Admitting: Physician Assistant

## 2021-11-21 VITALS — BP 156/73 | HR 60 | Temp 97.2°F | Resp 16 | Ht 68.0 in | Wt 170.5 lb

## 2021-11-21 DIAGNOSIS — I6523 Occlusion and stenosis of bilateral carotid arteries: Secondary | ICD-10-CM | POA: Diagnosis not present

## 2021-11-21 DIAGNOSIS — I6521 Occlusion and stenosis of right carotid artery: Secondary | ICD-10-CM | POA: Diagnosis not present

## 2021-11-29 DIAGNOSIS — L821 Other seborrheic keratosis: Secondary | ICD-10-CM | POA: Diagnosis not present

## 2021-11-29 DIAGNOSIS — L814 Other melanin hyperpigmentation: Secondary | ICD-10-CM | POA: Diagnosis not present

## 2021-11-29 DIAGNOSIS — L578 Other skin changes due to chronic exposure to nonionizing radiation: Secondary | ICD-10-CM | POA: Diagnosis not present

## 2021-11-29 DIAGNOSIS — D485 Neoplasm of uncertain behavior of skin: Secondary | ICD-10-CM | POA: Diagnosis not present

## 2021-11-29 DIAGNOSIS — D225 Melanocytic nevi of trunk: Secondary | ICD-10-CM | POA: Diagnosis not present

## 2021-12-05 ENCOUNTER — Ambulatory Visit: Payer: Medicare Other | Admitting: Vascular Surgery

## 2022-04-09 ENCOUNTER — Other Ambulatory Visit: Payer: Self-pay | Admitting: Internal Medicine

## 2022-05-03 ENCOUNTER — Encounter: Payer: Medicare Other | Admitting: Internal Medicine

## 2022-05-07 ENCOUNTER — Ambulatory Visit: Payer: Medicare Other

## 2022-05-14 NOTE — Progress Notes (Addendum)
Subjective:   DAVON ABDELAZIZ is a 81 y.o. male who presents for Medicare Annual/Subsequent preventive examination.  I connected with  Lonzo Cloud on 05/20/22 by a audio enabled telemedicine application and verified that I am speaking with the correct person using two identifiers.  Patient Location: Home  Provider Location: Office/Clinic  I discussed the limitations of evaluation and management by telemedicine. The patient expressed understanding and agreed to proceed.   Review of Systems     Cardiac Risk Factors include: advanced age (>61mn, >>73women);hypertension;dyslipidemia     Objective:    Today's Vitals   05/15/22 0906  Weight: 164 lb 12.8 oz (74.8 kg)  Height: '5\' 8"'$  (1.727 m)   Body mass index is 25.06 kg/m.     05/15/2022    9:04 AM 05/02/2021    8:23 AM 01/14/2020   11:46 AM 12/03/2019    8:18 AM 11/18/2019    8:31 AM 10/22/2019    1:26 PM 09/12/2015   10:15 AM  Advanced Directives  Does Patient Have a Medical Advance Directive? Yes Yes Yes Yes Yes Yes Yes  Type of AParamedicof [place]qu[place] LakeLiving will HSalt CreekLiving will HSierra MadreLiving will HNaranjitoLiving will HNorth RichmondLiving will HGuionLiving will Living will  Does patient want to make changes to medical advance directive? No - Patient declined  No - Patient declined No - Patient declined  No - Patient declined No - Patient declined  Copy of H[place]at[place] Chart? Yes - validated most recent copy scanned in chart (See row information) Yes - validated most recent copy scanned in chart (See row information) No - copy requested No - copy requested No - copy requested  Yes    Current Medications (verified) Outpatient Encounter Medications as of 05/15/2022  Medication Sig   amoxicillin (AMOXIL) 500 MG capsule Take 1,000 mg by mouth as needed. Dental visits   aspirin EC 81 MG  tablet Take 81 mg by mouth daily. Swallow whole.   atorvastatin (LIPITOR) 80 MG tablet Take 1 tablet by mouth once daily   cholecalciferol (VITAMIN D3) 25 MCG (1000 UT) tablet Take 1,000 Units by mouth daily.   Multiple Vitamin (MULTIVITAMIN WITH MINERALS) TABS tablet Take 1 tablet by mouth daily.   nitroGLYCERIN (NITROSTAT) 0.4 MG SL tablet Place 0.4 mg under the tongue every 5 (five) minutes x 3 doses as needed for chest pain.   vitamin B-12 (CYANOCOBALAMIN) 1000 MCG tablet Take 1,000 mcg by mouth daily.    Facility-Administered Encounter Medications as of 05/15/2022  Medication   sodium chloride flush (NS) 0.9 % injection 10 mL    Allergies (verified) Patient has no known allergies.   History: Past Medical History:  Diagnosis Date   Arthritis    osteoarthritis -hips, back pain tx with Prednisone-last dose 09-04-15.   CAD (coronary artery disease)    MI 2011, seen @ [facility], sees cards regulalry (once a year)   Hyperlipidemia    Hypertension    Skin cancer    R calf and the ear (?side)   Transfusion history    2nd Hip surgery   Past Surgical History:  Procedure Laterality Date   BUBBLE STUDY  11/18/2019   Procedure: BUBBLE STUDY;  Surgeon: SJerline Pain MD;  [facility];  Service: Cardiovascular;;   COLONOSCOPY  05/29/11   Normal   CORONARY ARTERY BYPASS GRAFT     05-2010-x4 vessel -  [facility]   EYE SURGERY Bilateral 2019   cataract   HIP SURGERY Left    x3 - L hip (initial hip replacement bleed)   INGUINAL HERNIA REPAIR Left    PATENT FORAMEN OVALE(PFO) CLOSURE N/A 12/03/2019   Procedure: PATENT FORAMEN OVALE (PFO) CLOSURE;  Surgeon: Sherren Mocha, MD;  Location: [facility];  Service: Cardiovascular;  Laterality: N/A;   TEE WITHOUT CARDIOVERSION N/A 11/18/2019   Procedure: TRANSESOPHAGEAL ECHOCARDIOGRAM (TEE);  Surgeon: Jerline Pain, MD;  Location[facility];  Service: Cardiovascular;  Laterality: N/A;   TOTAL HIP ARTHROPLASTY Right  09/12/2015   Procedure: RIGHT TOTAL HIP ARTHROPLASTY ANTERIOR APPROACH;  Surgeon: Paralee Cancel, MD;  Location: [facility];  Service: Orthopedics;  Laterality: Right;   VASECTOMY     Family History  Problem Relation Age of Onset   Coronary artery disease Neg Hx    Diabetes Neg Hx    Colon cancer Neg Hx    Prostate cancer Neg Hx    Seizures Neg Hx    Multiple sclerosis Neg Hx    Migraines Neg Hx    Dementia Neg Hx    Social History   Socioeconomic History   Marital status: Married    Spouse name: [name]   Number of children: 3   Years of education: Some college   Highest education level: Not on file  Occupational History   Occupation: Armed forces operational officer, Retired    Fish farm manager: OTHER  Tobacco Use   Smoking status: Never   Smokeless tobacco: Never  Vaping Use   Vaping Use: Never used  Substance and Sexual Activity   Alcohol use: Yes    Comment: rarely, last 6 months ago   Drug use: No   Sexual activity: Not on file  Other Topics Concern   Not on file  Social History Narrative   1 Daughter lives in [place], lives w/ wife    Caffeine use: 1-2 cups coffee per day   Soda: diet pepsi a few times a week   Right handed   Social Determinants of Health   Financial Resource Strain: Low Risk  (05/02/2021)   Overall Financial Resource Strain (CARDIA)    Difficulty of Paying Living Expenses: Not hard at all  Food Insecurity: No Food Insecurity (05/02/2021)   Hunger Vital Sign    Worried About Running Out of Food in the Last Year: Never true    Ran Out of Food in the Last Year: Never true  Transportation Needs: No Transportation Needs (05/02/2021)   PRAPARE - Hydrologist (Medical): No    Lack of Transportation (Non-Medical): No  Physical Activity: Sufficiently Active (05/02/2021)   Exercise Vital Sign    Days of Exercise per Week: 6 days    Minutes of Exercise per Session: 60 min  Stress: No Stress Concern Present (05/02/2021)   [place]    Feeling of Stress : Not at all  Social Connections: [place] (05/02/2021)   Social Connection and Isolation Panel [NHANES]    Frequency of Communication with Friends and Family: More than three times a week    Frequency of Social Gatherings with Friends and Family: More than three times a week    Attends Religious Services: More than 4 times per year    Active Member of [employer] or Organizations: Yes    Attends Archivist Meetings: More than 4 times per year    Marital Status:  Married    Tobacco Counseling Counseling given: Not Answered   Clinical Intake:  Pre-visit preparation completed: Yes  Pain : No/denies pain     BMI - recorded: 25.06 Nutritional Status: BMI 25 -29 Overweight Nutritional Risks: None Diabetes: No  How often do you need to have someone help you when you read instructions, pamphlets, or other written materials from your doctor or pharmacy?: 1 - Never  Diabetic?no  Interpreter Needed?: No  Information entered by :: Juniper Snyders   Activities of Daily Living    05/15/2022    9:07 AM  In your present state of health, do you have any difficulty performing the following activities:  Hearing? 0  Vision? 0  Difficulty concentrating or making decisions? 0  Walking or climbing stairs? 0  Dressing or bathing? 0  Doing errands, shopping? 0  Preparing Food and eating ? N  Using the Toilet? N  In the past six months, have you accidently leaked urine? N  Do you have problems with loss of bowel control? N  Managing your Medications? N  Managing your Finances? N  Housekeeping or managing your Housekeeping? N    Patient Care Team: Colon Branch, MD as PCP - Cyndia Diver, MD as PCP - Structural Heart (Cardiology) Powers, Elyse Jarvis, MD as Referring Physician (Cardiology) Melvenia Beam, MD as Consulting Physician (Neurology) Dimas Alexandria, MD as Consulting Physician  (Ophthalmology)  Indicate any recent Medical Services you may have received from other than [facility] providers in the past year (date may be approximate).     Assessment:   This is a routine wellness examination for Sacha.  Hearing/Vision screen No results found.  Dietary issues and exercise activities discussed: Current Exercise Habits: Home exercise routine, Type of exercise: walking;stretching, Time (Minutes): 25, Frequency (Times/Week): 7, Weekly Exercise (Minutes/Week): 175, Intensity: Mild, Exercise limited by: None identified   Goals Addressed             This Visit's Progress    DIET - INCREASE WATER INTAKE   Not on track      Depression Screen    05/15/2022    9:05 AM 10/23/2021   11:01 AM 05/02/2021    8:25 AM 12/28/2020    8:14 AM 01/14/2020   11:55 AM 04/22/2019    8:02 AM 11/03/2017   12:03 PM  PHQ 2/9 Scores  PHQ - 2 Score 0 0 0 0 0 0 0    Fall Risk    05/15/2022    9:04 AM 10/23/2021   11:01 AM 05/02/2021    8:24 AM 05/01/2021    9:44 AM 12/28/2020    8:15 AM  Fall Risk   Falls in the past year? 0 0 0 0 0  Number falls in past yr: 0 0 0 0 0  Injury with Fall? 0 0 0 0 0  Risk for fall due to : No Fall Risks      Follow up Falls evaluation completed Falls evaluation completed Falls prevention discussed Falls evaluation completed     [place]:  Any stairs in or around the home? Yes  If so, are there any without handrails? No  Home free of loose throw rugs in walkways, pet beds, electrical cords, etc? Yes  Adequate lighting in your home to reduce risk of falls? Yes   ASSISTIVE DEVICES UTILIZED TO PREVENT FALLS:  Life alert? No  Use of a cane, walker or w/c? No  Grab bars  in the bathroom? No  Shower chair or bench in shower? No  Elevated toilet seat or a handicapped toilet? No   TIMED UP AND GO:  Was the test performed? No .    Cognitive Function:    03/05/2016   11:31 AM  MMSE - Mini Mental State Exam   Orientation to time 4  Orientation to Place 5  Registration 3  Attention/ Calculation 5  Recall 3  Language- name 2 objects 2  Language- repeat 1  Language- follow 3 step command 3  Language- read & follow direction 1  Write a sentence 1  Copy design 1  Total score 29        05/15/2022    9:10 AM  6CIT Screen  What Year? 0 points  What month? 0 points  What time? 0 points  Count back from 20 2 points  Months in reverse 4 points  Repeat phrase 0 points  Total Score 6 points    Immunizations Immunization History  Administered Date(s) Administered   Fluad Quad(high Dose 65+) 05/29/2019   Influenza Split 07/01/2020, 05/07/2021   Influenza, High Dose Seasonal PF 08/01/2015, 09/16/2016   Influenza-Unspecified 07/07/2014, 08/01/2017, 07/07/2018   PFIZER(Purple Top)SARS-COV-2 Vaccination 10/18/2019, 11/08/2019, 07/01/2020, 05/03/2021   Pneumococcal Conjugate-13 06/01/2015   Pneumococcal Polysaccharide-23 05/30/2014   Tdap 04/25/2011, 03/14/2018   Zoster Recombinat (Shingrix) 04/26/2019, 07/29/2019    TDAP status: Up to date  Flu Vaccine status: Up to date  Pneumococcal vaccine status: Up to date  Covid-19 vaccine status: Information provided on how to obtain vaccines.   Qualifies for Shingles Vaccine? Yes   Zostavax completed No   Shingrix Completed?: Yes  Screening Tests Health Maintenance  Topic Date Due   COVID-19 Vaccine (5 - Pfizer risk series) 06/28/2021   INFLUENZA VACCINE  05/07/2022   TETANUS/TDAP  03/14/2028   Pneumonia Vaccine 28+ Years old  Completed   Zoster Vaccines- Shingrix  Completed   HPV VACCINES  Aged Out    Health Maintenance  Health Maintenance Due  Topic Date Due   COVID-19 Vaccine (5 - Pfizer risk series) 06/28/2021   INFLUENZA VACCINE  05/07/2022    Colorectal cancer screening: No longer required.   Lung Cancer Screening: (Low Dose CT Chest recommended if Age 61-80 years, 30 pack-year currently smoking OR have quit w/in  15years.) does not qualify.   Lung Cancer Screening Referral: n/a  Additional Screening:  Hepatitis C Screening: does not qualify; Completed aged out  Vision Screening: Recommended annual ophthalmology exams for early detection of glaucoma and other disorders of the eye. Is the patient up to date with their annual eye exam?  No  Who is the provider or what is the name of the office in which the patient attends annual eye exams? [facility] If pt is not established with a provider, would they like to be referred to a provider to establish care? No .   Dental Screening: Recommended annual dental exams for proper oral hygiene  Community Resource Referral / Chronic Care Management: CRR required this visit?  No   CCM required this visit?  No      Plan:     I have personally reviewed and noted the following in the patient's chart:   Medical and social history Use of alcohol, tobacco or illicit drugs  Current medications and supplements including opioid prescriptions. Patient is not currently taking opioid prescriptions. Functional ability and status Nutritional status Physical activity Advanced directives List of other physicians Hospitalizations, surgeries, and  ER visits in previous 12 months Vitals Screenings to include cognitive, depression, and falls Referrals and appointments  In addition, I have reviewed and discussed with patient certain preventive protocols, quality metrics, and best practice recommendations. A written personalized care plan for preventive services as well as general preventive health recommendations were provided to patient.     Duard Brady Sabin Gibeault, [place]   05/20/2022   Nurse Notes: none  I have reviewed and agree with [facility] documentation.  [name], MD

## 2022-05-15 ENCOUNTER — Ambulatory Visit (INDEPENDENT_AMBULATORY_CARE_PROVIDER_SITE_OTHER): Payer: Medicare Other

## 2022-05-15 VITALS — Ht 68.0 in | Wt 164.8 lb

## 2022-05-15 DIAGNOSIS — Z0000 Encounter for general adult medical examination without abnormal findings: Secondary | ICD-10-CM | POA: Diagnosis not present

## 2022-05-15 NOTE — Patient Instructions (Signed)
Tyler Ortega , Thank you for taking time to come for your Medicare Wellness Visit. I appreciate your ongoing commitment to your health goals. Please review the following plan we discussed and let me know if I can assist you in the future.   Screening recommendations/referrals: Colonoscopy: no longer needed Recommended yearly ophthalmology/optometry visit for glaucoma screening and checkup Recommended yearly dental visit for hygiene and checkup  Vaccinations: Influenza vaccine: up to date Pneumococcal vaccine: up to date Tdap vaccine: up to date Shingles vaccine: up to date   Covid-19: Due-May obtain vaccine at your local pharmacy.   Advanced directives: yes, on file   Conditions/risks identified: see problem list   Next appointment: Follow up in one year for your annual wellness visit. 05/20/23  Preventive Care 65 Years and Older, Male Preventive care refers to lifestyle choices and visits with your health care provider that can promote health and wellness. What does preventive care include? A yearly physical exam. This is also called an annual well check. Dental exams once or twice a year. Routine eye exams. Ask your health care provider how often you should have your eyes checked. Personal lifestyle choices, including: Daily care of your teeth and gums. Regular physical activity. Eating a healthy diet. Avoiding tobacco and drug use. Limiting alcohol use. Practicing safe sex. Taking low doses of aspirin every day. Taking vitamin and mineral supplements as recommended by your health care provider. What happens during an annual well check? The services and screenings done by your health care provider during your annual well check will depend on your age, overall health, lifestyle risk factors, and family history of disease. Counseling  Your health care provider may ask you questions about your: Alcohol use. Tobacco use. Drug use. Emotional well-being. Home and relationship  well-being. Sexual activity. Eating habits. History of falls. Memory and ability to understand (cognition). Work and work Statistician. Screening  You may have the following tests or measurements: Height, weight, and BMI. Blood pressure. Lipid and cholesterol levels. These may be checked every 5 years, or more frequently if you are over 33 years old. Skin check. Lung cancer screening. You may have this screening every year starting at age 42 if you have a 30-pack-year history of smoking and currently smoke or have quit within the past 15 years. Fecal occult blood test (FOBT) of the stool. You may have this test every year starting at age 42. Flexible sigmoidoscopy or colonoscopy. You may have a sigmoidoscopy every 5 years or a colonoscopy every 10 years starting at age 69. Prostate cancer screening. Recommendations will vary depending on your family history and other risks. Hepatitis C blood test. Hepatitis B blood test. Sexually transmitted disease (STD) testing. Diabetes screening. This is done by checking your blood sugar (glucose) after you have not eaten for a while (fasting). You may have this done every 1-3 years. Abdominal aortic aneurysm (AAA) screening. You may need this if you are a current or former smoker. Osteoporosis. You may be screened starting at age 20 if you are at high risk. Talk with your health care provider about your test results, treatment options, and if necessary, the need for more tests. Vaccines  Your health care provider may recommend certain vaccines, such as: Influenza vaccine. This is recommended every year. Tetanus, diphtheria, and acellular pertussis (Tdap, Td) vaccine. You may need a Td booster every 10 years. Zoster vaccine. You may need this after age 40. Pneumococcal 13-valent conjugate (PCV13) vaccine. One dose is recommended after age 79.  Pneumococcal polysaccharide (PPSV23) vaccine. One dose is recommended after age 36. Talk to your health care  provider about which screenings and vaccines you need and how often you need them. This information is not intended to replace advice given to you by your health care provider. Make sure you discuss any questions you have with your health care provider. Document Released: 10/20/2015 Document Revised: 06/12/2016 Document Reviewed: 07/25/2015 Elsevier Interactive Patient Education  2017 [place] Prevention in the Home Falls can cause injuries. They can happen to people of all ages. There are many things you can do to make your home safe and to help prevent falls. What can I do on the outside of my home? Regularly fix the edges of walkways and driveways and fix any cracks. Remove anything that might make you trip as you walk through a door, such as a raised step or threshold. Trim any bushes or trees on the path to your home. Use bright outdoor lighting. Clear any walking paths of anything that might make someone trip, such as rocks or tools. Regularly check to see if handrails are loose or broken. Make sure that both sides of any steps have handrails. Any raised decks and porches should have guardrails on the edges. Have any leaves, snow, or ice cleared regularly. Use sand or salt on walking paths during winter. Clean up any spills in your garage right away. This includes oil or grease spills. What can I do in the bathroom? Use night lights. Install grab bars by the toilet and in the tub and shower. Do not use towel bars as grab bars. Use non-skid mats or decals in the tub or shower. If you need to sit down in the shower, use a plastic, non-slip stool. Keep the floor dry. Clean up any water that spills on the floor as soon as it happens. Remove soap buildup in the tub or shower regularly. Attach bath mats securely with double-sided non-slip rug tape. Do not have throw rugs and other things on the floor that can make you trip. What can I do in the bedroom? Use night lights. Make  sure that you have a light by your bed that is easy to reach. Do not use any sheets or blankets that are too big for your bed. They should not hang down onto the floor. Have a firm chair that has side arms. You can use this for support while you get dressed. Do not have throw rugs and other things on the floor that can make you trip. What can I do in the kitchen? Clean up any spills right away. Avoid walking on wet floors. Keep items that you use a lot in easy-to-reach places. If you need to reach something above you, use a strong step stool that has a grab bar. Keep electrical cords out of the way. Do not use floor polish or wax that makes floors slippery. If you must use wax, use non-skid floor wax. Do not have throw rugs and other things on the floor that can make you trip. What can I do with my stairs? Do not leave any items on the stairs. Make sure that there are handrails on both sides of the stairs and use them. Fix handrails that are broken or loose. Make sure that handrails are as long as the stairways. Check any carpeting to make sure that it is firmly attached to the stairs. Fix any carpet that is loose or worn. Avoid having throw rugs at the  top or bottom of the stairs. If you do have throw rugs, attach them to the floor with carpet tape. Make sure that you have a light switch at the top of the stairs and the bottom of the stairs. If you do not have them, ask someone to add them for you. What else can I do to help prevent falls? Wear shoes that: Do not have high heels. Have rubber bottoms. Are comfortable and fit you well. Are closed at the toe. Do not wear sandals. If you use a stepladder: Make sure that it is fully opened. Do not climb a closed stepladder. Make sure that both sides of the stepladder are locked into place. Ask someone to hold it for you, if possible. Clearly mark and make sure that you can see: Any grab bars or handrails. First and last steps. Where the  edge of each step is. Use tools that help you move around (mobility aids) if they are needed. These include: Canes. Walkers. Scooters. Crutches. Turn on the lights when you go into a dark area. Replace any light bulbs as soon as they burn out. Set up your furniture so you have a clear path. Avoid moving your furniture around. If any of your floors are uneven, fix them. If there are any pets around you, be aware of where they are. Review your medicines with your doctor. Some medicines can make you feel dizzy. This can increase your chance of falling. Ask your doctor what other things that you can do to help prevent falls. This information is not intended to replace advice given to you by your health care provider. Make sure you discuss any questions you have with your health care provider. Document Released: 07/20/2009 Document Revised: 02/29/2016 Document Reviewed: 10/28/2014 Elsevier Interactive Patient Education  2017 [employer].

## 2022-06-29 ENCOUNTER — Other Ambulatory Visit: Payer: Self-pay | Admitting: Internal Medicine

## 2022-07-17 DIAGNOSIS — H5203 Hypermetropia, bilateral: Secondary | ICD-10-CM | POA: Diagnosis not present

## 2022-07-17 DIAGNOSIS — H02885 Meibomian gland dysfunction left lower eyelid: Secondary | ICD-10-CM | POA: Diagnosis not present

## 2022-09-18 ENCOUNTER — Ambulatory Visit (INDEPENDENT_AMBULATORY_CARE_PROVIDER_SITE_OTHER): Payer: Medicare Other | Admitting: Internal Medicine

## 2022-09-18 ENCOUNTER — Encounter: Payer: Self-pay | Admitting: Internal Medicine

## 2022-09-18 VITALS — BP 126/70 | HR 52 | Temp 98.1°F | Resp 16 | Ht 68.0 in | Wt 171.2 lb

## 2022-09-18 DIAGNOSIS — I10 Essential (primary) hypertension: Secondary | ICD-10-CM | POA: Diagnosis not present

## 2022-09-18 DIAGNOSIS — Z23 Encounter for immunization: Secondary | ICD-10-CM | POA: Diagnosis not present

## 2022-09-18 DIAGNOSIS — Z0000 Encounter for general adult medical examination without abnormal findings: Secondary | ICD-10-CM | POA: Diagnosis not present

## 2022-09-18 DIAGNOSIS — E785 Hyperlipidemia, unspecified: Secondary | ICD-10-CM

## 2022-09-18 DIAGNOSIS — R739 Hyperglycemia, unspecified: Secondary | ICD-10-CM | POA: Diagnosis not present

## 2022-09-18 LAB — COMPREHENSIVE METABOLIC PANEL
ALT: 15 U/L (ref 0–53)
AST: 29 U/L (ref 0–37)
Albumin: 4.1 g/dL (ref 3.5–5.2)
Alkaline Phosphatase: 68 U/L (ref 39–117)
BUN: 21 mg/dL (ref 6–23)
CO2: 28 mEq/L (ref 19–32)
Calcium: 9.1 mg/dL (ref 8.4–10.5)
Chloride: 105 mEq/L (ref 96–112)
Creatinine, Ser: 1.2 mg/dL (ref 0.40–1.50)
GFR: 57.1 mL/min — ABNORMAL LOW (ref >60.00)
Glucose, Bld: 98 mg/dL (ref 70–99)
Potassium: 4.4 mEq/L (ref 3.5–5.1)
Sodium: 139 mEq/L (ref 135–145)
Total Bilirubin: 0.8 mg/dL (ref 0.2–1.2)
Total Protein: 6.4 g/dL (ref 6.0–8.3)

## 2022-09-18 LAB — CBC WITH DIFFERENTIAL/PLATELET
Basophils Absolute: 0.1 10*3/uL (ref 0.0–0.1)
Basophils Relative: 0.8 % (ref 0.0–3.0)
Eosinophils Absolute: 0.4 10*3/uL (ref 0.0–0.7)
Eosinophils Relative: 5.4 % — ABNORMAL HIGH (ref 0.0–5.0)
HCT: 42.9 % (ref 39.0–52.0)
Hemoglobin: 14.6 g/dL (ref 13.0–17.0)
Lymphocytes Relative: 17.8 % (ref 12.0–46.0)
Lymphs Abs: 1.2 10*3/uL (ref 0.7–4.0)
MCHC: 34 g/dL (ref 30.0–36.0)
MCV: 99.8 fl (ref 78.0–100.0)
Monocytes Absolute: 0.4 10*3/uL (ref 0.1–1.0)
Monocytes Relative: 6.1 % (ref 3.0–12.0)
Neutro Abs: 4.6 10*3/uL (ref 1.4–7.7)
Neutrophils Relative %: 69.9 % (ref 43.0–77.0)
Platelets: 165 10*3/uL (ref 150.0–400.0)
RBC: 4.3 Mil/uL (ref 4.22–5.81)
RDW: 13.5 % (ref 11.5–15.5)
WBC: 6.5 10*3/uL (ref 4.0–10.5)

## 2022-09-18 LAB — LIPID PANEL
Cholesterol: 133 mg/dL (ref 0–200)
HDL: 61.8 mg/dL (ref >39.00)
LDL Cholesterol: 58 mg/dL (ref 0–99)
NonHDL: 70.83
Total CHOL/HDL Ratio: 2
Triglycerides: 63 mg/dL (ref 0.0–149.0)
VLDL: 12.6 mg/dL (ref 0.0–40.0)

## 2022-09-18 LAB — HEMOGLOBIN A1C: Hgb A1c MFr Bld: 6.2 % (ref 4.6–6.5)

## 2022-09-18 MED ORDER — ATORVASTATIN CALCIUM 80 MG PO TABS: 80.0000 mg | ORAL_TABLET | Freq: Every day | ORAL | 3 refills | Status: DC

## 2022-09-18 NOTE — Progress Notes (Unsigned)
Subjective:    Patient ID: Tyler Ortega, male    DOB: May 13, 1941, 81 y.o.   MRN: [id number]  DOS:  09/18/2022 Type of visit - description: Here for CPX  Since the last office visit is doing well. Ambulatory BPs normal. Reports he remains very active physically and mentally feeling fantastic.   Review of Systems See above   Past Medical History:  Diagnosis Date   Arthritis    osteoarthritis -hips, back pain tx with Prednisone-last dose 09-04-15.   CAD (coronary artery disease)    MI 2011, seen @ [facility], sees Public librarian (once a year)   Hyperlipidemia    Hypertension    Skin cancer    R calf and the ear (?side)   Transfusion history    2nd Hip surgery    Past Surgical History:  Procedure Laterality Date   BUBBLE STUDY  11/18/2019   Procedure: BUBBLE STUDY;  Surgeon: Jerline Pain, MD;  Location: [facility];  Service: Cardiovascular;;   COLONOSCOPY  05/29/11   Normal   CORONARY ARTERY BYPASS GRAFT     05-2010-x4 vessel [facility]   EYE SURGERY Bilateral 2019   cataract   HIP SURGERY Left    x3 - L hip (initial hip replacement bleed)   INGUINAL HERNIA REPAIR Left    PATENT FORAMEN OVALE(PFO) CLOSURE N/A 12/03/2019   Procedure: PATENT FORAMEN OVALE (PFO) CLOSURE;  Surgeon: Sherren Mocha, MD;  Location: [facility];  Service: Cardiovascular;  Laterality: N/A;   TEE WITHOUT CARDIOVERSION N/A 11/18/2019   Procedure: TRANSESOPHAGEAL ECHOCARDIOGRAM (TEE);  Surgeon: Jerline Pain, MD;  Location: [facility] ENDOSCOPY;  Service: Cardiovascular;  Laterality: N/A;   TOTAL HIP ARTHROPLASTY Right 09/12/2015   Procedure: RIGHT TOTAL HIP ARTHROPLASTY ANTERIOR APPROACH;  Surgeon: Paralee Cancel, MD;  Location: [facility];  Service: Orthopedics;  Laterality: Right;   VASECTOMY      Current Outpatient Medications  Medication Instructions   amoxicillin (AMOXIL) 1,000 mg, As needed   aspirin EC 81 mg, Oral, Daily, Swallow whole.    atorvastatin (LIPITOR) 80 mg, Oral, Daily    cholecalciferol (VITAMIN D3) 1,000 Units, Oral, Daily   cyanocobalamin (VITAMIN B12) 1,000 mcg, Oral, Daily   Multiple Vitamin (MULTIVITAMIN WITH MINERALS) TABS tablet 1 tablet, Oral, Daily   nitroGLYCERIN (NITROSTAT) 0.4 mg, Every 5 min x3 PRN       Objective:   Physical Exam BP 126/70   Pulse (!) 52   Temp 98.1 F (36.7 C) (Oral)   Resp 16   Ht '5\' 8"'$  (1.727 m)   Wt 171 lb 4 oz (77.7 kg)   SpO2 97%   BMI 26.04 kg/m  General: Well developed, NAD, BMI noted Neck: No  thyromegaly  HEENT:  Normocephalic . Face symmetric, atraumatic Lungs:  CTA B Normal respiratory effort, no intercostal retractions, no accessory muscle use. Heart: RRR,  no murmur.  Abdomen:  Not distended, soft, non-tender. No rebound or rigidity.   Lower extremities: no pretibial edema bilaterally  Skin: Exposed areas without rash. Not pale. Not jaundice Neurologic:  alert & oriented X3.  Speech normal, gait appropriate for age and unassisted Strength symmetric and appropriate for age.  Psych: Cognition and judgment appear intact.  Cooperative with normal attention span and concentration.  Behavior appropriate. No anxious or depressed appearing.     Assessment    Assessment Hyperglycemia  A1c 6.45/2017 Hyperlipidemia CAD: Dr. Prince Rome --MI and CABG 2011 --[facility]E> admitted>  cath 07-2017, had a stent --Transient amnesia triggered  a w/u: *MRI: strokes noted *ECHO --PFO closure (11/2019) , dx after a stroke   *Carotid artery dz, see CTA neck 09/2019 GI bleeding: Hg dropped to 4.8 after cath 07/2017, + hematemesis  EGD EG junction ulceration endoscopy  >>clipped Transient amnesia: saw neuro.  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 (likely chronic, unrelated finding per neurology); EEG (-) B12 def dx [id number] ABX pre dental work request by ortho d/t hip surgeries    PLAN: Here for CPX Hyperglycemia: Check A1c HTN:  BP today is very good, on no medicines, ambulatory BPs normal.  Checking labs Hyperlipidemia: On atorvastatin, checking labs CAD: No symptoms, continue aspirin, statins. Coronary artery disease: Saw vascular surgery 11/21/2021.  The carotid ultrasound have been stable for years, follow-up as needed.  Continue aspirin and statins. RTC 1 year.  -Td 2019 - Pneumonia shot: 2015;  prevnar: 2016.  PNM 20: today - RSV benefits discussed - s/p shingrix - s/p covid vax x 3, booster rec -  had a flu shot   --CCS: No further screening.  See previous note. --Prostate cancer screening: No further screening.  See previous note. 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

## 2022-09-18 NOTE — Patient Instructions (Addendum)
Vaccines I recommend:  Covid booster RSV vaccine  Check the  blood pressure regularly BP GOAL is between 110/65 and  135/85. If it is consistently higher or lower, let me know   GO TO THE LAB : Get the blood work     [place], [place] back for  a physical in 1 year

## 2022-09-19 ENCOUNTER — Encounter: Payer: Self-pay | Admitting: Internal Medicine

## 2022-09-19 NOTE — Assessment & Plan Note (Signed)
Here for CPX Hyperglycemia: Check A1c HTN: BP today is very good, on no medicines, ambulatory BPs normal.  Checking labs Hyperlipidemia: On atorvastatin, checking labs CAD: No symptoms, continue aspirin, statins. Carotid artery disease: Saw vascular surgery 11/21/2021.  The carotid ultrasound have been stable for years, follow-up as needed.  Continue aspirin and statins. RTC 1 year.

## 2022-09-19 NOTE — Assessment & Plan Note (Signed)
-  Td 2019 - Pneumonia shot: 2015;  prevnar: 2016.  PNM 20: today - RSV benefits discussed - s/p shingrix - s/p covid vax x 3, booster rec -  had a flu shot   --CCS: No further screening.  See previous note. --Prostate cancer screening: No further screening.  See previous note. 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

## 2022-11-06 DIAGNOSIS — H1132 Conjunctival hemorrhage, left eye: Secondary | ICD-10-CM | POA: Diagnosis not present

## 2022-12-05 DIAGNOSIS — D225 Melanocytic nevi of trunk: Secondary | ICD-10-CM | POA: Diagnosis not present

## 2022-12-05 DIAGNOSIS — L814 Other melanin hyperpigmentation: Secondary | ICD-10-CM | POA: Diagnosis not present

## 2022-12-05 DIAGNOSIS — L821 Other seborrheic keratosis: Secondary | ICD-10-CM | POA: Diagnosis not present

## 2022-12-05 DIAGNOSIS — L578 Other skin changes due to chronic exposure to nonionizing radiation: Secondary | ICD-10-CM | POA: Diagnosis not present

## 2023-01-21 ENCOUNTER — Encounter: Payer: Self-pay | Admitting: Family Medicine

## 2023-01-21 ENCOUNTER — Ambulatory Visit (HOSPITAL_BASED_OUTPATIENT_CLINIC_OR_DEPARTMENT_OTHER)
Admission: RE | Admit: 2023-01-21 | Discharge: 2023-01-21 | Disposition: A | Discharge status: Home / Self Care | Payer: Medicare Other | Source: Ambulatory Visit | Attending: Family Medicine | Admitting: Family Medicine

## 2023-01-21 ENCOUNTER — Ambulatory Visit (INDEPENDENT_AMBULATORY_CARE_PROVIDER_SITE_OTHER): Payer: Medicare Other | Admitting: Family Medicine

## 2023-01-21 VITALS — BP 138/75 | HR 67 | Temp 97.7°F | Ht 68.0 in | Wt 167.0 lb

## 2023-01-21 DIAGNOSIS — R059 Cough, unspecified: Secondary | ICD-10-CM | POA: Diagnosis not present

## 2023-01-21 DIAGNOSIS — R051 Acute cough: Secondary | ICD-10-CM | POA: Insufficient documentation

## 2023-01-21 MED ORDER — GUAIFENESIN ER 600 MG PO TB12: 1200.0000 mg | ORAL_TABLET | Freq: Two times a day (BID) | ORAL | 0 refills | Status: DC

## 2023-01-21 MED ORDER — AMOXICILLIN-POT CLAVULANATE 875-125 MG PO TABS: 1.0000 | ORAL_TABLET | Freq: Two times a day (BID) | ORAL | 0 refills | Status: DC

## 2023-01-21 MED ORDER — BENZONATATE 200 MG PO CAPS: 200.0000 mg | ORAL_CAPSULE | Freq: Two times a day (BID) | ORAL | 0 refills | Status: DC | PRN

## 2023-01-21 NOTE — Patient Instructions (Signed)
Given the severity of your symptoms and brown/pink sputum - chest xray today. We will update you with results.  Starting with antibiotics (Augmentin), cough medicine (Tessalon) and Mucinex.  Continue supportive measures including rest, hydration, humidifier use, steam showers, warm compresses to sinuses, warm liquids with lemon and honey, and over-the-counter cough, cold, and analgesics as needed.   Please contact office for follow-up if symptoms do not improve or worsen. Seek emergency care if symptoms become severe.

## 2023-01-21 NOTE — Progress Notes (Signed)
Acute Office Visit  Subjective:     Patient ID: QUOC TOME, male    DOB: 04-Jan-1941, 82 y.o.   MRN: [id number]  Chief Complaint  Patient presents with   Cough    congestion     Patient is in today for cough.   Patient reports that towards the end of last week, 5 to 6 days ago, he started with a sore throat, rhinorrhea, headache, gradually progressing into terrible cough and dyspnea with exertion.  Reports cough is frequent, deep, productive, keeps him up at night.  Sputum has been brown/pink-tinged.  Denies any significant respiratory history.  Currently denies ear pain, fevers, chills, body aches, nausea, vomiting, diarrhea.    All review of systems negative except what is listed in the HPI      Objective:    BP 138/75   Pulse 67   Temp 97.7 F (36.5 C) (Oral)   Ht  (1.727 m)   Wt 167 lb (75.8 kg)   BMI 25.39 kg/m    Physical Exam Vitals reviewed.  Constitutional:      Appearance: Normal appearance.  HENT:     Head: Normocephalic and atraumatic.     Nose: Nose normal.     Mouth/Throat:     Mouth: Mucous membranes are moist.     Pharynx: Oropharynx is clear. No oropharyngeal exudate or posterior oropharyngeal erythema.  Eyes:     Conjunctiva/sclera: Conjunctivae normal.  Cardiovascular:     Rate and Rhythm: Normal rate and regular rhythm.     Pulses: Normal pulses.     Heart sounds: Normal heart sounds.  Pulmonary:     Effort: Pulmonary effort is normal.     Breath sounds: Rhonchi present.     Comments: Diminished throughout bases Musculoskeletal:     Cervical back: Normal range of motion and neck supple.  Skin:    General: Skin is warm and dry.  Neurological:     Mental Status: He is alert and oriented to person, place, and time.  Psychiatric:        Mood and Affect: Mood normal.        Behavior: Behavior normal.        Thought Content: Thought content normal.        Judgment: Judgment normal.       No results found for any visits  on 01/21/23.      Assessment & Plan:   Problem List Items Addressed This Visit   None Visit Diagnoses     Acute cough    -  Primary   Relevant Medications   amoxicillin-clavulanate (AUGMENTIN) 875-125 MG tablet   benzonatate (TESSALON) 200 MG capsule   guaiFENesin (MUCINEX) 600 MG 12 hr tablet   Other Relevant Orders   DG Chest 2 View      Given the severity of your symptoms and brown/pink sputum - chest xray today. We will update you with results.  Starting with antibiotics (Augmentin), cough medicine (Tessalon) and Mucinex.  Continue supportive measures including rest, hydration, humidifier use, steam showers, warm compresses to sinuses, warm liquids with lemon and honey, and over-the-counter cough, cold, and analgesics as needed.      Meds ordered this encounter  Medications   amoxicillin-clavulanate (AUGMENTIN) 875-125 MG tablet    Sig: Take 1 tablet by mouth 2 (two) times daily.    Dispense:  20 tablet    Refill:  0    Order Specific Question:   Supervising Provider  Answer:   Danise Edge A [[id number]]   benzonatate (TESSALON) 200 MG capsule    Sig: Take 1 capsule (200 mg total) by mouth 2 (two) times daily as needed for cough.    Dispense:  20 capsule    Refill:  0    Order Specific Question:   Supervising Provider    Answer:   Danise Edge A [[id number]]   guaiFENesin (MUCINEX) 600 MG 12 hr tablet    Sig: Take 2 tablets (1,200 mg total) by mouth 2 (two) times daily.    Dispense:  30 tablet    Refill:  0    Order Specific Question:   Supervising Provider    Answer:   Danise Edge A [[id number]]    Return if symptoms worsen or fail to improve.  Clayborne Dana, NP

## 2023-01-23 MED ORDER — AZITHROMYCIN 250 MG PO TABS: ORAL_TABLET | ORAL | 0 refills | Status: AC

## 2023-01-23 NOTE — Addendum Note (Signed)
Addended by: Hyman Hopes B on: 01/23/2023 04:51 PM   Modules accepted: Orders

## 2023-03-17 ENCOUNTER — Other Ambulatory Visit: Payer: Self-pay

## 2023-03-17 MED ORDER — NITROGLYCERIN 0.4 MG SL SUBL: 0.4000 mg | SUBLINGUAL_TABLET | SUBLINGUAL | 0 refills | Status: AC | PRN

## 2023-04-17 DIAGNOSIS — I2510 Atherosclerotic heart disease of native coronary artery without angina pectoris: Secondary | ICD-10-CM | POA: Diagnosis not present

## 2023-05-07 ENCOUNTER — Encounter (INDEPENDENT_AMBULATORY_CARE_PROVIDER_SITE_OTHER): Payer: Self-pay

## 2023-07-21 ENCOUNTER — Telehealth: Payer: Self-pay | Admitting: Internal Medicine

## 2023-07-21 NOTE — Telephone Encounter (Signed)
Copied from CRM [id number]. Topic: Medicare AWV >> Jul 21, 2023  1:05 PM Payton Doughty wrote: Reason for CRM: Called LVM 07/21/2023 to schedule Annual Wellness Visit  Verlee Rossetti; Care Guide Ambulatory Clinical Support [place] [facility] Direct Dial: [phone]

## 2023-07-22 ENCOUNTER — Encounter: Payer: Self-pay | Admitting: Emergency Medicine

## 2023-07-23 ENCOUNTER — Ambulatory Visit (INDEPENDENT_AMBULATORY_CARE_PROVIDER_SITE_OTHER): Payer: Medicare Other | Admitting: Emergency Medicine

## 2023-07-23 VITALS — Ht 68.0 in | Wt 166.0 lb

## 2023-07-23 DIAGNOSIS — Z0000 Encounter for general adult medical examination without abnormal findings: Secondary | ICD-10-CM

## 2023-07-23 NOTE — Progress Notes (Signed)
Subjective:   Tyler Ortega is a 82 y.o. male who presents for Medicare Annual/Subsequent preventive examination.  Visit Complete: Virtual I connected with  Tyler Ortega on 07/23/23 by a audio enabled telemedicine application and verified that I am speaking with the correct person using two identifiers.  Patient Location: Home  Provider Location: Home Office  I discussed the limitations of evaluation and management by telemedicine. The patient expressed understanding and agreed to proceed.  Vital Signs: Because this visit was a virtual/telehealth visit, some criteria may be missing or patient reported. Any vitals not documented were not able to be obtained and vitals that have been documented are patient reported.  Patient Medicare AWV questionnaire was completed by the patient on 07/22/23; I have confirmed that all information answered by patient is correct and no changes since this date.  Cardiac Risk Factors include: advanced age (>79men, >1 women);hypertension;dyslipidemia;Other (see comment), Risk factor comments: known CAD     Objective:    Today's Vitals   07/23/23 1322  Weight: 166 lb (75.3 kg)  Height: 5\' 8"  (1.727 m)   Body mass index is 25.24 kg/m.     07/23/2023    1:38 PM 05/15/2022    9:04 AM 05/02/2021    8:23 AM 01/14/2020   11:46 AM 12/03/2019    8:18 AM 11/18/2019    8:31 AM 10/22/2019    1:26 PM  Advanced Directives  Does Patient Have a Medical Advance Directive? Yes Yes Yes Yes Yes Yes Yes  Type of Estate agent of [place];Living will Healthcare Power of [place];Living will Healthcare Power of [place];Living will Healthcare Power of [place];Living will Healthcare Power of [place];Living will Healthcare Power of [place];Living will Healthcare Power of [place];Living will  Does patient want to make changes to medical advance directive? No - Patient declined No - Patient declined  No - Patient declined No - Patient declined  No -  Patient declined  Copy of Healthcare Power of Attorney in Chart? Yes - validated most recent copy scanned in chart (See row information) Yes - validated most recent copy scanned in chart (See row information) Yes - validated most recent copy scanned in chart (See row information) No - copy requested No - copy requested No - copy requested     Current Medications (verified) Outpatient Encounter Medications as of 07/23/2023  Medication Sig   amoxicillin (AMOXIL) 500 MG capsule Take 1,000 mg by mouth as needed. Dental visits   aspirin EC 81 MG tablet Take 81 mg by mouth daily. Swallow whole.   atorvastatin (LIPITOR) 80 MG tablet Take 1 tablet (80 mg total) by mouth daily.   Multiple Vitamin (MULTIVITAMIN WITH MINERALS) TABS tablet Take 1 tablet by mouth daily.   nitroGLYCERIN (NITROSTAT) 0.4 MG SL tablet Place 1 tablet (0.4 mg total) under the tongue every 5 (five) minutes x 3 doses as needed for chest pain.   vitamin B-12 (CYANOCOBALAMIN) 1000 MCG tablet Take 1,000 mcg by mouth daily.    amoxicillin-clavulanate (AUGMENTIN) 875-125 MG tablet Take 1 tablet by mouth 2 (two) times daily. (Patient not taking: Reported on 07/23/2023)   benzonatate (TESSALON) 200 MG capsule Take 1 capsule (200 mg total) by mouth 2 (two) times daily as needed for cough. (Patient not taking: Reported on 07/23/2023)   cholecalciferol (VITAMIN D3) 25 MCG (1000 UT) tablet Take 1,000 Units by mouth daily. (Patient not taking: Reported on 07/23/2023)   guaiFENesin (MUCINEX) 600 MG 12 hr tablet Take 2 tablets (1,200 mg total)  by mouth 2 (two) times daily. (Patient not taking: Reported on 07/23/2023)   Facility-Administered Encounter Medications as of 07/23/2023  Medication   sodium chloride flush (NS) 0.9 % injection 10 mL    Allergies (verified) Patient has no known allergies.   History: Past Medical History:  Diagnosis Date   Arthritis    osteoarthritis -hips, back pain tx with Prednisone-last dose 09-04-15.   CAD  (coronary artery disease)    MI 2011, seen @ [facility], sees Tax adviser (once a year)   Hyperlipidemia    Hypertension    Skin cancer    R calf and the ear (?side)   Transfusion history    2nd Hip surgery   Past Surgical History:  Procedure Laterality Date   BUBBLE STUDY  11/18/2019   Procedure: BUBBLE STUDY;  Surgeon: Jake Bathe, MD;  Location: [facility] ENDOSCOPY;  Service: Cardiovascular;;   COLONOSCOPY  05/29/11   Normal   CORONARY ARTERY BYPASS GRAFT     05-2010-x4 vessel [facility]   EYE SURGERY Bilateral 2019   cataract   HIP SURGERY Left    x3 - L hip (initial hip replacement bleed)   INGUINAL HERNIA REPAIR Left    PATENT FORAMEN OVALE(PFO) CLOSURE N/A 12/03/2019   Procedure: PATENT FORAMEN OVALE (PFO) CLOSURE;  Surgeon: Tonny Bollman, MD;  Location: [facility] INVASIVE CV LAB;  Service: Cardiovascular;  Laterality: N/A;   TEE WITHOUT CARDIOVERSION N/A 11/18/2019   Procedure: TRANSESOPHAGEAL ECHOCARDIOGRAM (TEE);  Surgeon: Jake Bathe, MD;  Location: [facility] ENDOSCOPY;  Service: Cardiovascular;  Laterality: N/A;   TOTAL HIP ARTHROPLASTY Right 09/12/2015   Procedure: RIGHT TOTAL HIP ARTHROPLASTY ANTERIOR APPROACH;  Surgeon: Durene Romans, MD;  Location: [facility] ORS;  Service: Orthopedics;  Laterality: Right;   VASECTOMY     Family History  Problem Relation Age of Onset   Other Mother 28       "old age"   Other Father 49       tumor on neck   Coronary artery disease Neg Hx    Diabetes Neg Hx    Colon cancer Neg Hx    Prostate cancer Neg Hx    Seizures Neg Hx    Multiple sclerosis Neg Hx    Migraines Neg Hx    Dementia Neg Hx    Social History   Socioeconomic History   Marital status: Married    Spouse name: Tyler Ortega   Number of children: 3   Years of education: Some college   Highest education level: Not on file  Occupational History   Occupation: Psychologist, sport and exercise, Retired    Associate Professor: OTHER  Tobacco Use   Smoking status: Never   Smokeless tobacco: Never  Vaping Use    Vaping status: Never Used  Substance and Sexual Activity   Alcohol use: Yes    Comment: rarely 1 glass of wine every 6 mths., last 6 months ago   Drug use: No   Sexual activity: Not on file  Other Topics Concern   Not on file  Social History Narrative   3 children from previous marriage, lives w/ wife,   Still working 3 days per week @ [employer]   Caffeine use: 1-2 cups coffee per day   Soda: diet pepsi a few times a week   Right handed   Social Determinants of Health   Financial Resource Strain: Low Risk  (07/22/2023)   Overall Financial Resource Strain (CARDIA)    Difficulty of Paying Living Expenses: Not hard at all  Food Insecurity: No Food Insecurity (07/22/2023)   Hunger Vital Sign    Worried About Running Out of Food in the Last Year: Never true    Ran Out of Food in the Last Year: Never true  Transportation Needs: No Transportation Needs (07/22/2023)   PRAPARE - Administrator, Civil Service (Medical): No    Lack of Transportation (Non-Medical): No  Physical Activity: Sufficiently Active (07/22/2023)   Exercise Vital Sign    Days of Exercise per Week: 6 days    Minutes of Exercise per Session: 40 min  Stress: No Stress Concern Present (07/22/2023)   [employer] of Occupational Health - Occupational Stress Questionnaire    Feeling of Stress : Not at all  Social Connections: Socially Integrated (07/22/2023)   Social Connection and Isolation Panel [NHANES]    Frequency of Communication with Friends and Family: More than three times a week    Frequency of Social Gatherings with Friends and Family: More than three times a week    Attends Religious Services: More than 4 times per year    Active Member of [employer] or Organizations: Yes    Attends Engineer, structural: More than 4 times per year    Marital Status: Married    Tobacco Counseling Counseling given: Not Answered   Clinical Intake:  Pre-visit preparation completed: Yes  Pain  : No/denies pain     BMI - recorded: 25.24 Nutritional Status: BMI 25 -29 Overweight Nutritional Risks: None Diabetes: No  How often do you need to have someone help you when you read instructions, pamphlets, or other written materials from your doctor or pharmacy?: 1 - Never  Interpreter Needed?: No  Information entered by :: Tora Kindred, CMA   Activities of Daily Living    07/22/2023    4:21 PM  In your present state of health, do you have any difficulty performing the following activities:  Hearing? 0  Vision? 0  Difficulty concentrating or making decisions? 0  Walking or climbing stairs? 0  Dressing or bathing? 0  Doing errands, shopping? 0  Preparing Food and eating ? N  Using the Toilet? N  In the past six months, have you accidently leaked urine? N  Do you have problems with loss of bowel control? N  Managing your Medications? N  Managing your Finances? N  Housekeeping or managing your Housekeeping? N    Patient Care Team: Wanda Plump, MD as PCP - Jerelene Redden, MD as PCP - Structural Heart (Cardiology) Powers, Everardo All, MD as Referring Physician (Cardiology) Anson Fret, MD as Consulting Physician (Neurology) Billee Cashing, MD as Consulting Physician (Ophthalmology)  Indicate any recent Medical Services you may have received from other than [facility] providers in the past year (date may be approximate).     Assessment:   This is a routine wellness examination for [place].  Hearing/Vision screen Hearing Screening - Comments:: Denies hearing loss Vision Screening - Comments:: Gets routine eye exams   Goals Addressed               This Visit's Progress     Patient Stated (pt-stated)        Maintain current health      Depression Screen    07/23/2023    1:35 PM 09/18/2022    9:54 AM 05/15/2022    9:05 AM 10/23/2021   11:01 AM 05/02/2021    8:25 AM 12/28/2020    8:14 AM 01/14/2020   11:55  AM  PHQ 2/9 Scores  PHQ - 2 Score 0 0 0 0 0 0  0  PHQ- 9 Score 0          Fall Risk    07/22/2023    4:21 PM 09/18/2022    9:54 AM 05/15/2022    9:04 AM 10/23/2021   11:01 AM 05/02/2021    8:24 AM  Fall Risk   Falls in the past year? 0 0 0 0 0  Number falls in past yr: 0 0 0 0 0  Injury with Fall? 0 0 0 0 0  Risk for fall due to : No Fall Risks  No Fall Risks    Follow up Falls prevention discussed Falls evaluation completed Falls evaluation completed Falls evaluation completed Falls prevention discussed    MEDICARE RISK AT HOME: Medicare Risk at Home Any stairs in or around the home?: Yes If so, are there any without handrails?: No Home free of loose throw rugs in walkways, pet beds, electrical cords, etc?: Yes Adequate lighting in your home to reduce risk of falls?: Yes Life alert?: No Use of a cane, walker or w/c?: Yes (prn for long walks) Grab bars in the bathroom?: No Shower chair or bench in shower?: No Elevated toilet seat or a handicapped toilet?: No  TIMED UP AND GO:  Was the test performed?  No    Cognitive Function:    03/05/2016   11:31 AM  MMSE - Mini Mental State Exam  Orientation to time 4  Orientation to Place 5  Registration 3  Attention/ Calculation 5  Recall 3  Language- name 2 objects 2  Language- repeat 1  Language- follow 3 step command 3  Language- read & follow direction 1  Write a sentence 1  Copy design 1  Total score 29        07/23/2023    1:39 PM 05/15/2022    9:10 AM  6CIT Screen  What Year? 0 points 0 points  What month? 0 points 0 points  What time? 0 points 0 points  Count back from 20 0 points 2 points  Months in reverse 0 points 4 points  Repeat phrase 0 points 0 points  Total Score 0 points 6 points    Immunizations Immunization History  Administered Date(s) Administered   Fluad Quad(high Dose 65+) 05/29/2019   Influenza Split 07/01/2020, 05/07/2021, 06/07/2022   Influenza, High Dose Seasonal PF 08/01/2015, 09/16/2016   Influenza-Unspecified 07/07/2014,  08/01/2017, 07/07/2018   PFIZER(Purple Top)SARS-COV-2 Vaccination 10/18/2019, 11/08/2019, 07/01/2020, 05/03/2021   PNEUMOCOCCAL CONJUGATE-20 09/18/2022   Pneumococcal Conjugate-13 06/01/2015   Pneumococcal Polysaccharide-23 05/30/2014   Tdap 04/25/2011, 03/14/2018   Zoster Recombinant(Shingrix) 04/26/2019, 07/29/2019    TDAP status: Up to date  Flu Vaccine status: Due, Education has been provided regarding the importance of this vaccine. Advised may receive this vaccine at local pharmacy or Health Dept. Aware to provide a copy of the vaccination record if obtained from local pharmacy or Health Dept. Verbalized acceptance and understanding.  Pneumococcal vaccine status: Up to date  Covid-19 vaccine status: Declined, Education has been provided regarding the importance of this vaccine but patient still declined. Advised may receive this vaccine at local pharmacy or Health Dept.or vaccine clinic. Aware to provide a copy of the vaccination record if obtained from local pharmacy or Health Dept. Verbalized acceptance and understanding.  Qualifies for Shingles Vaccine? Yes   Zostavax completed No   Shingrix Completed?: Yes  Screening Tests Health Maintenance  Topic Date  Due   INFLUENZA VACCINE  05/08/2023   COVID-19 Vaccine (5 - 2023-24 season) 06/08/2023   Medicare Annual Wellness (AWV)  07/22/2024   DTaP/Tdap/Td (3 - Td or Tdap) 03/14/2028   Pneumonia Vaccine 81+ Years old  Completed   Zoster Vaccines- Shingrix  Completed   HPV VACCINES  Aged Out   Hepatitis C Screening  Discontinued    Health Maintenance  Health Maintenance Due  Topic Date Due   INFLUENZA VACCINE  05/08/2023   COVID-19 Vaccine (5 - 2023-24 season) 06/08/2023    Colorectal cancer screening: No longer required.   Lung Cancer Screening: (Low Dose CT Chest recommended if Age 30-80 years, 20 pack-year currently smoking OR have quit w/in 15years.) does not qualify.   Lung Cancer Screening Referral:  n/a  Additional Screening:  Hepatitis C Screening: does not qualify; Completed 12/28/20  Vision Screening: Recommended annual ophthalmology exams for early detection of glaucoma and other disorders of the eye.  Dental Screening: Recommended annual dental exams for proper oral hygiene    Community Resource Referral / Chronic Care Management: CRR required this visit?  No   CCM required this visit?  No     Plan:     I have personally reviewed and noted the following in the patient's chart:   Medical and social history Use of alcohol, tobacco or illicit drugs  Current medications and supplements including opioid prescriptions. Patient is not currently taking opioid prescriptions. Functional ability and status Nutritional status Physical activity Advanced directives List of other physicians Hospitalizations, surgeries, and ER visits in previous 12 months Vitals Screenings to include cognitive, depression, and falls Referrals and appointments  In addition, I have reviewed and discussed with patient certain preventive protocols, quality metrics, and best practice recommendations. A written personalized care plan for preventive services as well as general preventive health recommendations were provided to patient.     Tora Kindred, CMA   07/23/2023   After Visit Summary: (MyChart) Due to this being a telephonic visit, the after visit summary with patients personalized plan was offered to patient via MyChart   Nurse Notes: Will get Flu vaccine at the pharmacy Declined covid vaccine

## 2023-07-23 NOTE — Patient Instructions (Addendum)
Tyler Ortega , Thank you for taking time to come for your Medicare Wellness Visit. I appreciate your ongoing commitment to your health goals. Please review the following plan we discussed and let me know if I can assist you in the future.   Referrals/Orders/Follow-Ups/Clinician Recommendations: Recommend getting the flu shot at your earliest convenience. Keep up the good work!  This is a list of the screening recommended for you and due dates:  Health Maintenance  Topic Date Due   Flu Shot  05/08/2023   COVID-19 Vaccine (5 - 2023-24 season) 06/08/2023   Medicare Annual Wellness Visit  07/22/2024   DTaP/Tdap/Td vaccine (3 - Td or Tdap) 03/14/2028   Pneumonia Vaccine  Completed   Zoster (Shingles) Vaccine  Completed   HPV Vaccine  Aged Out   Hepatitis C Screening  Discontinued    Advanced directives: (In Chart) A copy of your advanced directives are scanned into your chart should your provider ever need it.  Next Medicare Annual Wellness Visit scheduled for next year: Yes, 07/28/24 @ 1:00pm

## 2023-08-11 DIAGNOSIS — C44311 Basal cell carcinoma of skin of nose: Secondary | ICD-10-CM | POA: Diagnosis not present

## 2023-09-05 ENCOUNTER — Other Ambulatory Visit: Payer: Self-pay | Admitting: Internal Medicine

## 2023-09-22 ENCOUNTER — Encounter: Payer: Medicare Other | Admitting: Internal Medicine

## 2023-12-02 ENCOUNTER — Other Ambulatory Visit: Payer: Self-pay | Admitting: Internal Medicine

## 2023-12-04 DIAGNOSIS — L570 Actinic keratosis: Secondary | ICD-10-CM | POA: Diagnosis not present

## 2023-12-04 DIAGNOSIS — L821 Other seborrheic keratosis: Secondary | ICD-10-CM | POA: Diagnosis not present

## 2023-12-23 ENCOUNTER — Encounter: Payer: Self-pay | Admitting: Internal Medicine

## 2023-12-23 ENCOUNTER — Ambulatory Visit (INDEPENDENT_AMBULATORY_CARE_PROVIDER_SITE_OTHER): Payer: Medicare Other | Admitting: Internal Medicine

## 2023-12-23 VITALS — BP 136/80 | HR 50 | Temp 98.1°F | Resp 16 | Ht 68.0 in | Wt 173.2 lb

## 2023-12-23 DIAGNOSIS — Z0001 Encounter for general adult medical examination with abnormal findings: Secondary | ICD-10-CM

## 2023-12-23 DIAGNOSIS — R739 Hyperglycemia, unspecified: Secondary | ICD-10-CM

## 2023-12-23 DIAGNOSIS — Z0000 Encounter for general adult medical examination without abnormal findings: Secondary | ICD-10-CM

## 2023-12-23 DIAGNOSIS — R194 Change in bowel habit: Secondary | ICD-10-CM

## 2023-12-23 DIAGNOSIS — I2510 Atherosclerotic heart disease of native coronary artery without angina pectoris: Secondary | ICD-10-CM

## 2023-12-23 DIAGNOSIS — E785 Hyperlipidemia, unspecified: Secondary | ICD-10-CM | POA: Diagnosis not present

## 2023-12-23 LAB — LIPID PANEL
Cholesterol: 128 mg/dL (ref 0–200)
HDL: 59 mg/dL (ref >39.00)
LDL Cholesterol: 58 mg/dL (ref 0–99)
NonHDL: 68.53
Total CHOL/HDL Ratio: 2
Triglycerides: 55 mg/dL (ref 0.0–149.0)
VLDL: 11 mg/dL (ref 0.0–40.0)

## 2023-12-23 LAB — CBC WITH DIFFERENTIAL/PLATELET
Basophils Absolute: 0 10*3/uL (ref 0.0–0.1)
Basophils Relative: 0.8 % (ref 0.0–3.0)
Eosinophils Absolute: 0.4 10*3/uL (ref 0.0–0.7)
Eosinophils Relative: 7.7 % — ABNORMAL HIGH (ref 0.0–5.0)
HCT: 42.8 % (ref 39.0–52.0)
Hemoglobin: 14.4 g/dL (ref 13.0–17.0)
Lymphocytes Relative: 23.2 % (ref 12.0–46.0)
Lymphs Abs: 1.3 10*3/uL (ref 0.7–4.0)
MCHC: 33.7 g/dL (ref 30.0–36.0)
MCV: 99.5 fl (ref 78.0–100.0)
Monocytes Absolute: 0.5 10*3/uL (ref 0.1–1.0)
Monocytes Relative: 8 % (ref 3.0–12.0)
Neutro Abs: 3.5 10*3/uL (ref 1.4–7.7)
Neutrophils Relative %: 60.3 % (ref 43.0–77.0)
Platelets: 162 10*3/uL (ref 150.0–400.0)
RBC: 4.3 Mil/uL (ref 4.22–5.81)
RDW: 13.8 % (ref 11.5–15.5)
WBC: 5.8 10*3/uL (ref 4.0–10.5)

## 2023-12-23 LAB — COMPREHENSIVE METABOLIC PANEL
ALT: 15 U/L (ref 0–53)
AST: 26 U/L (ref 0–37)
Albumin: 4.2 g/dL (ref 3.5–5.2)
Alkaline Phosphatase: 74 U/L (ref 39–117)
BUN: 20 mg/dL (ref 6–23)
CO2: 29 meq/L (ref 19–32)
Calcium: 9.1 mg/dL (ref 8.4–10.5)
Chloride: 104 meq/L (ref 96–112)
Creatinine, Ser: 1.09 mg/dL (ref 0.40–1.50)
GFR: 63.51 mL/min (ref >60.00)
Glucose, Bld: 89 mg/dL (ref 70–99)
Potassium: 4.3 meq/L (ref 3.5–5.1)
Sodium: 139 meq/L (ref 135–145)
Total Bilirubin: 0.7 mg/dL (ref 0.2–1.2)
Total Protein: 6.7 g/dL (ref 6.0–8.3)

## 2023-12-23 LAB — HEMOGLOBIN A1C: Hgb A1c MFr Bld: 6.2 % (ref 4.6–6.5)

## 2023-12-23 LAB — TSH: TSH: 2.75 u[IU]/mL (ref 0.35–5.50)

## 2023-12-23 NOTE — Patient Instructions (Addendum)
 For the change in bowel habits, will refer you to gastroenterology. You can reach them at [phone]. Continue probiotics Take Metamucil 1 or 2 capsules daily with lots of water.  Vaccines I recommend: RSV vaccine Flu shot every fall      GO TO THE LAB : Get the blood work     Please go to the front desk: Arrange a follow-up in 6 months       "Health Care Power of attorney" (Also know as a  "Living will" or  Advance care planning documents)  If you already have a living will or healthcare power of attorney, is recommended you bring the copy to be scanned in your chart.   The document will be available to all the doctors you see in the system.  If you are over 40 y/o and don't have the document, please read:  Advance care planning is a process that supports adults in  understanding and sharing their preferences regarding future medical care.  The patient's preferences are recorded in documents called Advance Directives and the can be modified at any time while the patient is in full mental capacity.     More information at: [email or web address]

## 2023-12-23 NOTE — Assessment & Plan Note (Signed)
 Here for CPX -Td 2019 - Pneumonia shot: 2015;  prevnar: 2016.  PNM 20: 2023 - s/p shingrix -07/23/2023 had a covid vax  -Vaccines to consider: RSV, flu shot every fall --CCS and prostate cancer : No further screening.   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

## 2023-12-23 NOTE — Assessment & Plan Note (Signed)
 Here for CPX  We also address the following issues. Change in bowel habits: New issue.  For the last year he is having constipation alternating with diarrhea. Will check a CBC, TSH.  Plan: Refer  to GI.  Colonoscopy?Marland Kitchen Continue probiotics, add Metamucil. Prediabetes: Has a healthy lifestyle, check A1c. Hyperlipidemia:On atorvastatin 80, checking FLP. CAD: On aspirin, asymptomatic. Shoulder pain: On and off issue, plans to see Ortho.  Take occasional ibuprofen recommend to try Tylenol first and only sporadic ibuprofen if needed. RTC 6 months

## 2023-12-23 NOTE — Progress Notes (Signed)
 Subjective:    Patient ID: Tyler Ortega, male    DOB: Mar 18, 1941, 83 y.o.   MRN: [id number]  DOS:  12/23/2023 Type of visit - description: cpx  Here for CPX  Chronic medical problems addressed.  He denies chest pain or difficulty breathing, no edema or palpitations.  Did report a change in bowel habits for the last year.  Has occasional diarrhea and constipation.  His stools are loose, constipation may last 2 to 3 days and is associated with hard stools. Denies fever or chills.  No weight loss.  No blood in the stools.  No abdominal pain  Review of Systems  Other than above, a 14 point review of systems is negative      Past Medical History:  Diagnosis Date   Arthritis    osteoarthritis -hips, back pain tx with Prednisone-last dose 09-04-15.   CAD (coronary artery disease)    MI 2011, seen @ [facility], sees Tax adviser (once a year)   Hyperlipidemia    Hypertension    Skin cancer    R calf and the ear (?side)   Transfusion history    2nd Hip surgery    Past Surgical History:  Procedure Laterality Date   BUBBLE STUDY  11/18/2019   Procedure: BUBBLE STUDY;  Surgeon: Jake Bathe, MD;  Location: [facility] ENDOSCOPY;  Service: Cardiovascular;;   COLONOSCOPY  05/29/11   Normal   CORONARY ARTERY BYPASS GRAFT     05-2010-x4 vessel [facility]   EYE SURGERY Bilateral 2019   cataract   HIP SURGERY Left    x3 - L hip (initial hip replacement bleed)   INGUINAL HERNIA REPAIR Left    PATENT FORAMEN OVALE(PFO) CLOSURE N/A 12/03/2019   Procedure: PATENT FORAMEN OVALE (PFO) CLOSURE;  Surgeon: Tonny Bollman, MD;  Location: [facility] INVASIVE CV LAB;  Service: Cardiovascular;  Laterality: N/A;   TEE WITHOUT CARDIOVERSION N/A 11/18/2019   Procedure: TRANSESOPHAGEAL ECHOCARDIOGRAM (TEE);  Surgeon: Jake Bathe, MD;  Location: [facility] ENDOSCOPY;  Service: Cardiovascular;  Laterality: N/A;   TOTAL HIP ARTHROPLASTY Right 09/12/2015   Procedure: RIGHT TOTAL HIP ARTHROPLASTY ANTERIOR APPROACH;   Surgeon: Durene Romans, MD;  Location: [facility] ORS;  Service: Orthopedics;  Laterality: Right;   VASECTOMY     Social History   Socioeconomic History   Marital status: Married    Spouse name: Bonita Quin   Number of children: 3   Years of education: Some college   Highest education level: Not on file  Occupational History   Occupation: Psychologist, sport and exercise, Retired    Associate Professor: OTHER  Tobacco Use   Smoking status: Never   Smokeless tobacco: Never  Vaping Use   Vaping status: Never Used  Substance and Sexual Activity   Alcohol use: Yes    Comment: rarely 1 glass of wine every 6 mths., last 6 months ago   Drug use: No   Sexual activity: Not on file  Other Topics Concern   Not on file  Social History Narrative   3 children from previous marriage, lives w/ wife,   Still working 3 days per week @ [employer]      Caffeine use: 1-2 cups coffee per day   Soda: diet pepsi a few times a week   Right handed   Social Drivers of Corporate investment banker Strain: Low Risk  (07/22/2023)   Overall Financial Resource Strain (CARDIA)    Difficulty of Paying Living Expenses: Not hard at all  Food Insecurity: No Food  Insecurity (07/22/2023)   Hunger Vital Sign    Worried About Running Out of Food in the Last Year: Never true    Ran Out of Food in the Last Year: Never true  Transportation Needs: No Transportation Needs (07/22/2023)   PRAPARE - Administrator, Civil Service (Medical): No    Lack of Transportation (Non-Medical): No  Physical Activity: Sufficiently Active (07/22/2023)   Exercise Vital Sign    Days of Exercise per Week: 6 days    Minutes of Exercise per Session: 40 min  Stress: No Stress Concern Present (07/22/2023)   [employer] of Occupational Health - Occupational Stress Questionnaire    Feeling of Stress : Not at all  Social Connections: Socially Integrated (07/22/2023)   Social Connection and Isolation Panel [NHANES]    Frequency of Communication with Friends  and Family: More than three times a week    Frequency of Social Gatherings with Friends and Family: More than three times a week    Attends Religious Services: More than 4 times per year    Active Member of [employer] or Organizations: Yes    Attends Engineer, structural: More than 4 times per year    Marital Status: Married  Catering manager Violence: Not At Risk (07/23/2023)   Humiliation, Afraid, Rape, and Kick questionnaire    Fear of Current or Ex-Partner: No    Emotionally Abused: No    Physically Abused: No    Sexually Abused: No    Current Outpatient Medications  Medication Instructions   amoxicillin (AMOXIL) 1,000 mg, As needed   aspirin EC 81 mg, Daily   atorvastatin (LIPITOR) 80 mg, Oral, Daily   cyanocobalamin (VITAMIN B12) 1,000 mcg, Daily   Multiple Vitamin (MULTIVITAMIN WITH MINERALS) TABS tablet 1 tablet, Daily   nitroGLYCERIN (NITROSTAT) 0.4 mg, Sublingual, Every 5 min x3 PRN       Objective:   Physical Exam BP 136/80   Pulse (!) 50   Temp 98.1 F (36.7 C) (Oral)   Resp 16   Ht 5\' 8"  (1.727 m)   Wt 173 lb 4 oz (78.6 kg)   SpO2 97%   BMI 26.34 kg/m  General: Well developed, NAD, BMI noted Neck: No  thyromegaly  HEENT:  Normocephalic . Face symmetric, atraumatic Lungs:  CTA B Normal respiratory effort, no intercostal retractions, no accessory muscle use. Heart: RRR,  no murmur.  Abdomen:  Not distended, soft, non-tender. No rebound or rigidity.   Lower extremities: no pretibial edema bilaterally  Skin: Exposed areas without rash. Not pale. Not jaundice Neurologic:  alert & oriented X3.  Speech normal, gait appropriate for age and unassisted Strength symmetric and appropriate for age.  Psych: Cognition and judgment appear intact.  Cooperative with normal attention span and concentration.  Behavior appropriate. No anxious or depressed appearing.     Assessment   Assessment Hyperglycemia  A1c 6.45/2017 Hyperlipidemia CAD: Dr.  Lorenso Courier --MI and CABG 2011 --[facility]E> admitted>  cath 07-2017, had a stent --Transient amnesia triggered a w/u: *MRI: strokes noted *ECHO --PFO closure (11/2019) , dx after a stroke   *Carotid artery dz, see CTA neck 09/2019 GI bleeding: Hg dropped to 4.8 after cath 07/2017, + hematemesis  EGD EG junction ulceration endoscopy  >>clipped Transient amnesia: saw neuro.  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 (likely chronic, unrelated finding per neurology); EEG (-) B12 def dx 02-2106 ABX pre dental work request  by ortho d/t hip surgeries    PLAN: Here for CPX -Td 2019 - Pneumonia shot: 2015;  prevnar: 2016.  PNM 20: 2023 - s/p shingrix -07/23/2023 had a covid vax  -Vaccines to consider: RSV, flu shot every fall --CCS and prostate cancer : No further screening.   --Labs: See orders - life style: Continue to be very active, still works 3 times a week, teaches Sunday school - POA on file  We also address the following issues. Change in bowel habits: New issue.  For the last year he is having constipation alternating with diarrhea. Will check a CBC, TSH.  Plan: Refer  to GI.  Colonoscopy?Marland Kitchen Continue probiotics, add Metamucil. Prediabetes: Has a healthy lifestyle, check A1c. Hyperlipidemia:On atorvastatin 80, checking FLP. CAD: On aspirin, asymptomatic. Shoulder pain: On and off issue, plans to see Ortho.  Take occasional ibuprofen recommend to try Tylenol first and only sporadic ibuprofen if needed. RTC 6 months

## 2023-12-25 ENCOUNTER — Encounter: Payer: Self-pay | Admitting: Internal Medicine

## 2024-01-20 ENCOUNTER — Encounter: Payer: Self-pay | Admitting: Gastroenterology

## 2024-01-26 DIAGNOSIS — S61210A Laceration without foreign body of right index finger without damage to nail, initial encounter: Secondary | ICD-10-CM | POA: Diagnosis not present

## 2024-01-26 DIAGNOSIS — Z23 Encounter for immunization: Secondary | ICD-10-CM | POA: Diagnosis not present

## 2024-01-29 DIAGNOSIS — M79644 Pain in right finger(s): Secondary | ICD-10-CM | POA: Diagnosis not present

## 2024-02-05 DIAGNOSIS — M79644 Pain in right finger(s): Secondary | ICD-10-CM | POA: Diagnosis not present

## 2024-03-06 ENCOUNTER — Encounter (HOSPITAL_BASED_OUTPATIENT_CLINIC_OR_DEPARTMENT_OTHER): Payer: Self-pay | Admitting: Emergency Medicine

## 2024-03-06 ENCOUNTER — Emergency Department (HOSPITAL_BASED_OUTPATIENT_CLINIC_OR_DEPARTMENT_OTHER)
Admission: EM | Admit: 2024-03-06 | Discharge: 2024-03-06 | Disposition: A | Discharge status: Home / Self Care | Attending: Emergency Medicine | Admitting: Emergency Medicine

## 2024-03-06 DIAGNOSIS — Z7982 Long term (current) use of aspirin: Secondary | ICD-10-CM | POA: Diagnosis not present

## 2024-03-06 DIAGNOSIS — D649 Anemia, unspecified: Secondary | ICD-10-CM | POA: Diagnosis not present

## 2024-03-06 DIAGNOSIS — I2510 Atherosclerotic heart disease of native coronary artery without angina pectoris: Secondary | ICD-10-CM | POA: Diagnosis not present

## 2024-03-06 DIAGNOSIS — Z79899 Other long term (current) drug therapy: Secondary | ICD-10-CM | POA: Insufficient documentation

## 2024-03-06 DIAGNOSIS — R112 Nausea with vomiting, unspecified: Secondary | ICD-10-CM | POA: Diagnosis not present

## 2024-03-06 DIAGNOSIS — R197 Diarrhea, unspecified: Secondary | ICD-10-CM | POA: Diagnosis not present

## 2024-03-06 DIAGNOSIS — I10 Essential (primary) hypertension: Secondary | ICD-10-CM | POA: Diagnosis not present

## 2024-03-06 DIAGNOSIS — Z951 Presence of aortocoronary bypass graft: Secondary | ICD-10-CM | POA: Insufficient documentation

## 2024-03-06 LAB — URINALYSIS, ROUTINE W REFLEX MICROSCOPIC
Bilirubin Urine: NEGATIVE
Glucose, UA: NEGATIVE mg/dL
Hgb urine dipstick: NEGATIVE
Ketones, ur: NEGATIVE mg/dL
Leukocytes,Ua: NEGATIVE
Nitrite: NEGATIVE
Protein, ur: NEGATIVE mg/dL
Specific Gravity, Urine: 1.02 (ref 1.005–1.030)
pH: 5.5 (ref 5.0–8.0)

## 2024-03-06 LAB — CBC WITH DIFFERENTIAL/PLATELET
Abs Immature Granulocytes: 0.03 K/uL (ref 0.00–0.07)
Basophils Absolute: 0 K/uL (ref 0.0–0.1)
Basophils Relative: 0 %
Eosinophils Absolute: 0.1 K/uL (ref 0.0–0.5)
Eosinophils Relative: 1 %
HCT: 32.5 % — ABNORMAL LOW (ref 39.0–52.0)
Hemoglobin: 10.9 g/dL — ABNORMAL LOW (ref 13.0–17.0)
Immature Granulocytes: 0 %
Lymphocytes Relative: 16 %
Lymphs Abs: 1.2 K/uL (ref 0.7–4.0)
MCH: 34 pg (ref 26.0–34.0)
MCHC: 33.5 g/dL (ref 30.0–36.0)
MCV: 101.2 fL — ABNORMAL HIGH (ref 80.0–100.0)
Monocytes Absolute: 0.5 K/uL (ref 0.1–1.0)
Monocytes Relative: 7 %
Neutro Abs: 5.5 K/uL (ref 1.7–7.7)
Neutrophils Relative %: 76 %
Platelets: 142 K/uL — ABNORMAL LOW (ref 150–400)
RBC: 3.21 MIL/uL — ABNORMAL LOW (ref 4.22–5.81)
RDW: 13.6 % (ref 11.5–15.5)
WBC: 7.3 K/uL (ref 4.0–10.5)
nRBC: 0 % (ref 0.0–0.2)

## 2024-03-06 LAB — COMPREHENSIVE METABOLIC PANEL WITH GFR
ALT: 15 U/L (ref 0–44)
AST: 24 U/L (ref 15–41)
Albumin: 3.8 g/dL (ref 3.5–5.0)
Alkaline Phosphatase: 60 U/L (ref 38–126)
Anion gap: 9 (ref 5–15)
BUN: 59 mg/dL — ABNORMAL HIGH (ref 8–23)
CO2: 22 mmol/L (ref 22–32)
Calcium: 8.9 mg/dL (ref 8.9–10.3)
Chloride: 110 mmol/L (ref 98–111)
Creatinine, Ser: 1.04 mg/dL (ref 0.61–1.24)
GFR, Estimated: >60 mL/min
Glucose, Bld: 81 mg/dL (ref 70–99)
Potassium: 4.2 mmol/L (ref 3.5–5.1)
Sodium: 141 mmol/L (ref 135–145)
Total Bilirubin: 0.2 mg/dL (ref 0.0–1.2)
Total Protein: 5.9 g/dL — ABNORMAL LOW (ref 6.5–8.1)

## 2024-03-06 LAB — OCCULT BLOOD X 1 CARD TO LAB, STOOL: Fecal Occult Bld: POSITIVE — AB

## 2024-03-06 LAB — LIPASE, BLOOD: Lipase: 18 U/L (ref 11–51)

## 2024-03-06 MED ORDER — PANTOPRAZOLE SODIUM 40 MG IV SOLR
40.0000 mg | Freq: Once | INTRAVENOUS | Status: AC
  Administered 2024-03-06: 40 mg via INTRAVENOUS
  Filled 2024-03-06: qty 10

## 2024-03-06 MED ORDER — SODIUM CHLORIDE 0.9 % IV BOLUS
500.0000 mL | Freq: Once | INTRAVENOUS | Status: AC
  Administered 2024-03-06: 500 mL via INTRAVENOUS

## 2024-03-06 NOTE — ED Triage Notes (Signed)
 Pt reports episode of NVD, diaphoresis and dizziness last night; stool was very black; denies pain; sts he had stomach cramps during this episode; wanted to be checked out d/t his heart hx

## 2024-03-06 NOTE — ED Provider Notes (Signed)
 [name] [facility] Provider Note   CSN: [id number] Arrival date & time: 03/06/24  1140     History  Chief Complaint  Patient presents with   Emesis    Tyler Ortega is a 83 y.o. male.  Patient is a 83 year old male who presents with nausea vomiting and diarrhea.  He has a history of hypertension, hyperlipidemia, coronary artery disease status post stent and bypass surgery.  He states that he has had years of loose stools alternating with constipation.  He spoke to his PCP about this at his last visit and was started on fiber supplements as well as probiotics.  He said his stools essentially returned to normal and he has been having normal bowel movements and feeling much better.  Last night he had onset of profound diarrhea associated with nausea and vomiting and abdominal cramping.  He says he "lost 4 pounds of stool".  He did have episodes where he was profoundly diaphoretic.  This was during the crampy diarrhea episodes.  He did not report any chest discomfort or shortness of breath.  However he came in because he wanted to make sure nothing else was concerning.  He does report that his stools were black in nature.  They were watery.  He denies any hematemesis.  No fevers.  He is feeling better now other than just feels weak.  He denies any history of GI bleeds other than he had some hematemesis during a cardiac procedure.       Home Medications Prior to Admission medications   Medication Sig Start Date End Date Taking? Authorizing Provider  amoxicillin  (AMOXIL ) 500 MG capsule Take 1,000 mg by mouth as needed. Dental visits Patient not taking: Reported on 12/23/2023 11/28/20   [provider]  aspirin  EC 81 MG tablet Take 81 mg by mouth daily. Swallow whole.    [provider]  atorvastatin  (LIPITOR) 80 MG tablet Take 1 tablet by mouth once daily 12/02/23   Paz, Jose E, MD  Multiple Vitamin (MULTIVITAMIN WITH MINERALS) TABS  tablet Take 1 tablet by mouth daily.    [provider]  nitroGLYCERIN  (NITROSTAT ) 0.4 MG SL tablet Place 1 tablet (0.4 mg total) under the tongue every 5 (five) minutes x 3 doses as needed for chest pain. Patient not taking: Reported on 12/23/2023 03/17/23   Cooper, Michael, MD  vitamin B-12 (CYANOCOBALAMIN ) 1000 MCG tablet Take 1,000 mcg by mouth daily.     [provider]      Allergies    Patient has no known allergies.    Review of Systems   Review of Systems  Constitutional:  Positive for diaphoresis and fatigue. Negative for chills and fever.  HENT:  Negative for congestion, rhinorrhea and sneezing.   Eyes: Negative.   Respiratory:  Negative for cough, chest tightness and shortness of breath.   Cardiovascular:  Negative for chest pain and leg swelling.  Gastrointestinal:  Positive for abdominal pain, diarrhea, nausea and vomiting. Negative for blood in stool.  Genitourinary:  Negative for difficulty urinating, flank pain, frequency and hematuria.  Musculoskeletal:  Negative for arthralgias and back pain.  Skin:  Negative for rash.  Neurological:  Positive for light-headedness. Negative for dizziness, speech difficulty, weakness, numbness and headaches.    Physical Exam Updated Vital Signs BP 116/71 (BP Location: Right Arm)   Pulse 88   Temp 97.6 F (36.4 C) (Oral)   Resp 20   Ht 5\' 8"  (1.727 m)  Wt 74.4 kg   SpO2 100%   BMI 24.94 kg/m  Physical Exam Constitutional:      Appearance: He is well-developed.  HENT:     Head: Normocephalic and atraumatic.  Eyes:     Pupils: Pupils are equal, round, and reactive to light.  Cardiovascular:     Rate and Rhythm: Normal rate and regular rhythm.     Heart sounds: Normal heart sounds.  Pulmonary:     Effort: Pulmonary effort is normal. No respiratory distress.     Breath sounds: Normal breath sounds. No wheezing or rales.  Chest:     Chest wall: No tenderness.  Abdominal:     General: Bowel sounds are  normal.     Palpations: Abdomen is soft.     Tenderness: There is no abdominal tenderness. There is no guarding or rebound.  Genitourinary:    Comments: Stool is dark, no gross melena Musculoskeletal:        General: Normal range of motion.     Cervical back: Normal range of motion and neck supple.  Lymphadenopathy:     Cervical: No cervical adenopathy.  Skin:    General: Skin is warm and dry.     Findings: No rash.  Neurological:     Mental Status: He is alert and oriented to person, place, and time.     ED Results / Procedures / Treatments   Labs (all labs ordered are listed, but only abnormal results are displayed) Labs Reviewed  COMPREHENSIVE METABOLIC PANEL WITH GFR - Abnormal; Notable for the following components:      Result Value   BUN 59 (*)    Total Protein 5.9 (*)    All other components within normal limits  CBC WITH DIFFERENTIAL/PLATELET - Abnormal; Notable for the following components:   RBC 3.21 (*)    Hemoglobin 10.9 (*)    HCT 32.5 (*)    MCV 101.2 (*)    Platelets 142 (*)    All other components within normal limits  OCCULT BLOOD X 1 CARD TO LAB, STOOL - Abnormal; Notable for the following components:   Fecal Occult Bld POSITIVE (*)    All other components within normal limits  LIPASE, BLOOD  URINALYSIS, ROUTINE W REFLEX MICROSCOPIC    EKG EKG Interpretation Date/Time:  Saturday Mar 06 2024 12:42:59 EDT Ventricular Rate:  71 PR Interval:  247 QRS Duration:  90 QT Interval:  402 QTC Calculation: 437 R Axis:   32  Text Interpretation: Sinus rhythm Prolonged PR interval Nonspecific T abnormalities, lateral leads ST changes similar to prior EKG Confirmed by Hershel Los [phone]) on 03/06/2024 12:54:57 PM  Radiology No results found.  Procedures Procedures    Medications Ordered in ED Medications  sodium chloride  0.9 % bolus 500 mL (500 mLs Intravenous New Bag/Given 03/06/24 1345)  pantoprazole (PROTONIX) injection 40 mg (40 mg Intravenous  Given 03/06/24 1345)    ED Course/ Medical Decision Making/ A&P                                 Medical Decision Making Amount and/or Complexity of Data Reviewed Labs: ordered.  Risk Prescription drug management.   Patient is a 83 year old male who presents with episode of nausea vomiting and diarrhea during the night.  He had some bad abdominal cramping and diaphoresis.  That all resolved and now he just feels a little weak.  He has no abdominal pain.  His labs show a bit of a decrease in his hemoglobin at 10.4.  This is a drop from 14 on his last blood work a few months ago.  His other blood work is nonconcerning.  Given his lack of ongoing abdominal pain, I would have a low suspicion of diverticulitis, appendicitis, bowel obstruction or other acute abnormality.  He is overall well-appearing.  He was given a little bit of IV fluids.  His vital signs are stable.  His rectal exam showed dark stool but no obvious melena.  Discussed options with patient.  He has had a drop in his hemoglobin.  However given his lack of ongoing symptoms and lack of ongoing black stools, feel that he potentially could go home with close follow-up with his PCP.  The other option would be to admit him to the hospital.  Discussed these options with the patient he is opting for discharge.  He will try to follow-up with his doctor on Monday or Tuesday.  He assures me that if he has any ongoing black stools or feels worse overall he will return to the ED for reevaluation.  Final Clinical Impression(s) / ED Diagnoses Final diagnoses:  Diarrhea, unspecified type  Anemia, unspecified type    Rx / DC Orders ED Discharge Orders     None         Hershel Los, MD 03/06/24 1415

## 2024-03-06 NOTE — Discharge Instructions (Addendum)
 Follow-up on Monday or Tuesday with your primary care doctor.  You need to have your hemoglobin rechecked.  Return to the emergency room if you have any ongoing black stools, increased weakness, dizziness or other worsening symptoms.

## 2024-03-08 ENCOUNTER — Encounter (HOSPITAL_BASED_OUTPATIENT_CLINIC_OR_DEPARTMENT_OTHER): Payer: Self-pay | Admitting: Emergency Medicine

## 2024-03-08 ENCOUNTER — Inpatient Hospital Stay (HOSPITAL_BASED_OUTPATIENT_CLINIC_OR_DEPARTMENT_OTHER)
Admission: EM | Admit: 2024-03-08 | Discharge: 2024-03-11 | DRG: 378 | Disposition: A | Discharge status: Home / Self Care | Attending: Family Medicine | Admitting: Family Medicine

## 2024-03-08 ENCOUNTER — Telehealth: Payer: Self-pay | Admitting: Internal Medicine

## 2024-03-08 ENCOUNTER — Other Ambulatory Visit: Payer: Self-pay

## 2024-03-08 DIAGNOSIS — I441 Atrioventricular block, second degree: Secondary | ICD-10-CM | POA: Diagnosis present

## 2024-03-08 DIAGNOSIS — I10 Essential (primary) hypertension: Secondary | ICD-10-CM | POA: Diagnosis present

## 2024-03-08 DIAGNOSIS — Z7982 Long term (current) use of aspirin: Secondary | ICD-10-CM

## 2024-03-08 DIAGNOSIS — K922 Gastrointestinal hemorrhage, unspecified: Secondary | ICD-10-CM | POA: Diagnosis present

## 2024-03-08 DIAGNOSIS — I959 Hypotension, unspecified: Secondary | ICD-10-CM | POA: Diagnosis present

## 2024-03-08 DIAGNOSIS — Z79899 Other long term (current) drug therapy: Secondary | ICD-10-CM

## 2024-03-08 DIAGNOSIS — Z85828 Personal history of other malignant neoplasm of skin: Secondary | ICD-10-CM

## 2024-03-08 DIAGNOSIS — R001 Bradycardia, unspecified: Secondary | ICD-10-CM | POA: Diagnosis present

## 2024-03-08 DIAGNOSIS — D649 Anemia, unspecified: Principal | ICD-10-CM

## 2024-03-08 DIAGNOSIS — R197 Diarrhea, unspecified: Secondary | ICD-10-CM | POA: Diagnosis not present

## 2024-03-08 DIAGNOSIS — K254 Chronic or unspecified gastric ulcer with hemorrhage: Secondary | ICD-10-CM | POA: Diagnosis not present

## 2024-03-08 DIAGNOSIS — D6832 Hemorrhagic disorder due to extrinsic circulating anticoagulants: Secondary | ICD-10-CM | POA: Diagnosis present

## 2024-03-08 DIAGNOSIS — K259 Gastric ulcer, unspecified as acute or chronic, without hemorrhage or perforation: Secondary | ICD-10-CM | POA: Diagnosis not present

## 2024-03-08 DIAGNOSIS — Z9841 Cataract extraction status, right eye: Secondary | ICD-10-CM

## 2024-03-08 DIAGNOSIS — K2901 Acute gastritis with bleeding: Principal | ICD-10-CM | POA: Diagnosis present

## 2024-03-08 DIAGNOSIS — T39395A Adverse effect of other nonsteroidal anti-inflammatory drugs [NSAID], initial encounter: Secondary | ICD-10-CM | POA: Diagnosis present

## 2024-03-08 DIAGNOSIS — K269 Duodenal ulcer, unspecified as acute or chronic, without hemorrhage or perforation: Secondary | ICD-10-CM | POA: Diagnosis not present

## 2024-03-08 DIAGNOSIS — E785 Hyperlipidemia, unspecified: Secondary | ICD-10-CM | POA: Diagnosis not present

## 2024-03-08 DIAGNOSIS — Z951 Presence of aortocoronary bypass graft: Secondary | ICD-10-CM

## 2024-03-08 DIAGNOSIS — K2900 Acute gastritis without bleeding: Secondary | ICD-10-CM | POA: Diagnosis not present

## 2024-03-08 DIAGNOSIS — Z955 Presence of coronary angioplasty implant and graft: Secondary | ICD-10-CM

## 2024-03-08 DIAGNOSIS — D62 Acute posthemorrhagic anemia: Secondary | ICD-10-CM | POA: Diagnosis present

## 2024-03-08 DIAGNOSIS — K921 Melena: Secondary | ICD-10-CM

## 2024-03-08 DIAGNOSIS — Z96641 Presence of right artificial hip joint: Secondary | ICD-10-CM | POA: Diagnosis present

## 2024-03-08 DIAGNOSIS — T39015A Adverse effect of aspirin, initial encounter: Secondary | ICD-10-CM | POA: Diagnosis present

## 2024-03-08 DIAGNOSIS — I2510 Atherosclerotic heart disease of native coronary artery without angina pectoris: Secondary | ICD-10-CM | POA: Diagnosis present

## 2024-03-08 DIAGNOSIS — Z9842 Cataract extraction status, left eye: Secondary | ICD-10-CM

## 2024-03-08 DIAGNOSIS — I252 Old myocardial infarction: Secondary | ICD-10-CM

## 2024-03-08 DIAGNOSIS — R61 Generalized hyperhidrosis: Secondary | ICD-10-CM | POA: Diagnosis not present

## 2024-03-08 DIAGNOSIS — Z8774 Personal history of (corrected) congenital malformations of heart and circulatory system: Secondary | ICD-10-CM

## 2024-03-08 DIAGNOSIS — Z8673 Personal history of transient ischemic attack (TIA), and cerebral infarction without residual deficits: Secondary | ICD-10-CM

## 2024-03-08 LAB — CBC WITH DIFFERENTIAL/PLATELET
Abs Immature Granulocytes: 0.04 10*3/uL (ref 0.00–0.07)
Basophils Absolute: 0.1 10*3/uL (ref 0.0–0.1)
Basophils Relative: 1 %
Eosinophils Absolute: 0.3 10*3/uL (ref 0.0–0.5)
Eosinophils Relative: 3 %
HCT: 26.4 % — ABNORMAL LOW (ref 39.0–52.0)
Hemoglobin: 8.9 g/dL — ABNORMAL LOW (ref 13.0–17.0)
Immature Granulocytes: 1 %
Lymphocytes Relative: 22 %
Lymphs Abs: 1.6 10*3/uL (ref 0.7–4.0)
MCH: 33.8 pg (ref 26.0–34.0)
MCHC: 33.7 g/dL (ref 30.0–36.0)
MCV: 100.4 fL — ABNORMAL HIGH (ref 80.0–100.0)
Monocytes Absolute: 0.5 10*3/uL (ref 0.1–1.0)
Monocytes Relative: 7 %
Neutro Abs: 4.8 10*3/uL (ref 1.7–7.7)
Neutrophils Relative %: 66 %
Platelets: 135 10*3/uL — ABNORMAL LOW (ref 150–400)
RBC: 2.63 MIL/uL — ABNORMAL LOW (ref 4.22–5.81)
RDW: 13.6 % (ref 11.5–15.5)
WBC: 7.3 10*3/uL (ref 4.0–10.5)
nRBC: 0 % (ref 0.0–0.2)

## 2024-03-08 LAB — COMPREHENSIVE METABOLIC PANEL WITH GFR
ALT: 17 U/L (ref 0–44)
AST: 31 U/L (ref 15–41)
Albumin: 3.8 g/dL (ref 3.5–5.0)
Alkaline Phosphatase: 57 U/L (ref 38–126)
Anion gap: 9 (ref 5–15)
BUN: 30 mg/dL — ABNORMAL HIGH (ref 8–23)
CO2: 23 mmol/L (ref 22–32)
Calcium: 8.5 mg/dL — ABNORMAL LOW (ref 8.9–10.3)
Chloride: 111 mmol/L (ref 98–111)
Creatinine, Ser: 1.15 mg/dL (ref 0.61–1.24)
GFR, Estimated: >60 mL/min (ref >60)
Glucose, Bld: 94 mg/dL (ref 70–99)
Potassium: 3.6 mmol/L (ref 3.5–5.1)
Sodium: 143 mmol/L (ref 135–145)
Total Bilirubin: 0.3 mg/dL (ref 0.0–1.2)
Total Protein: 5.7 g/dL — ABNORMAL LOW (ref 6.5–8.1)

## 2024-03-08 MED ORDER — PANTOPRAZOLE SODIUM 40 MG IV SOLR
80.0000 mg | Freq: Once | INTRAVENOUS | Status: AC
  Administered 2024-03-08: 80 mg via INTRAVENOUS
  Filled 2024-03-08: qty 20

## 2024-03-08 NOTE — Progress Notes (Signed)
 Hospitalist Transfer Note:    Nursing staff, Please call [facility] Admits & Consults System-Wide number on Amion [phone]) as soon as patient's arrival, so appropriate admitting provider can evaluate the pt.   Transferring facility: [facility] Requesting provider: Dr. Abner Hoffman (EDP at [facility]) Reason for transfer: admission for further evaluation and management of acute upper GI bleed complicated by acute blood loss anemia.     83 year old male, who presented to [facility] ED complaining of 4 days of dark stool, without any associated hematochezia nor any abdominal discomfort. Not a/w any N/V or hematemesis.  On a daily baby aspirin , but otherwise no blood thinners as an outpatient.  He notes some associated generalized weakness over the last few days, but no additional acute symptoms, including no report of any chest pain, shortness of breath, dizziness.   He had presented to the emergency department 2 days ago for evaluation of, at that time, 2 days of melena.  at that time, his hemoglobin was found to be 10.9, compared to 14.4 in March 2025. He was subsequently discharged to home from the emergency department, with outpatient GI follow-up with [facility] gastroenterology.  However, in the setting of additional episodes of melena, the patient presents to [facility] this evening for further evaluation management of the above.  Vital signs in the ED were notable for the following: Afebrile; heart rates in the 70s; systolic blood pressures in the 130s, respiratory rate 15, and oxygen saturation 99% on room air.  Labs were notable for CBC, which showed hemoglobin of 8.9.  EDP contacted on-call [facility], Dr. Dominic Friendly, who is on-call at [facility], requesting formal consult.   Medications administered prior to transfer included the following: Protonix 80 mg IV x 1 dose.  Subsequently, I accepted this patient for transfer for observation admission to a med-tele bed at [facility] for further work-up and management of the  above.      Camelia Cavalier, DO Hospitalist

## 2024-03-08 NOTE — Telephone Encounter (Signed)
 This pt is scheduled for a lab appointment for 6/3 just need labs.

## 2024-03-08 NOTE — ED Triage Notes (Signed)
 Pt POV- recently seen for weakness and diarrhea.  Denies any ongoing diarrhea.  Was told that he needs to have hemoglobin rechecked this week, was unable to be seen by PCP until Wednesday.   Denies ShOB, palpitations, CP. C/o ongoing weakness.  Denies syncope, near syncope.   Good po intake.

## 2024-03-08 NOTE — Telephone Encounter (Signed)
 Spoke w/ Pt- he was informed by EDP to recheck hemoglobin this week- informed he would have to see Dr. Neomi Banks first. Appt changed to follow-up w/ Dr. Neomi Banks.

## 2024-03-08 NOTE — ED Provider Notes (Signed)
 [place] [facility] Provider Note   CSN: [id number] Arrival date & time: 03/08/24  1848     History  Chief Complaint  Patient presents with   Follow-up    Tyler Ortega is a 83 y.o. male.  83 year old male with past medical history of hypertension, hyperlipidemia, and coronary artery disease on aspirin  presenting to the emergency department today with ongoing fatigue and generalized weakness.  The patient was seen here 2 days ago after having multiple episodes of dark stools.  He states that he had a dark stool yesterday but has not had a bowel movement today.  Denies any abdominal pain.  He was Hemoccult +2 days ago.  Was told to come back and have his hemoglobin rechecked.  He has an appointment for the 12th with Dr. Dominic Friendly from GI.  With his persistent weakness he came in today to have his hemoglobin rechecked.  He denies any fevers.  He came to the emergency department today for further evaluation regarding this.        Home Medications Prior to Admission medications   Medication Sig Start Date End Date Taking? Authorizing Provider  amoxicillin  (AMOXIL ) 500 MG capsule Take 1,000 mg by mouth as needed. Dental visits Patient not taking: Reported on 12/23/2023 11/28/20   [provider]  aspirin  EC 81 MG tablet Take 81 mg by mouth daily. Swallow whole.    [provider]  atorvastatin  (LIPITOR) 80 MG tablet Take 1 tablet by mouth once daily 12/02/23   Paz, Jose E, MD  Multiple Vitamin (MULTIVITAMIN WITH MINERALS) TABS tablet Take 1 tablet by mouth daily.    [provider]  nitroGLYCERIN  (NITROSTAT ) 0.4 MG SL tablet Place 1 tablet (0.4 mg total) under the tongue every 5 (five) minutes x 3 doses as needed for chest pain. Patient not taking: Reported on 12/23/2023 03/17/23   Arnoldo Lapping, MD  vitamin B-12 (CYANOCOBALAMIN ) 1000 MCG tablet Take 1,000 mcg by mouth daily.     [provider]      Allergies     Patient has no known allergies.    Review of Systems   Review of Systems  Constitutional:  Positive for fatigue.  Neurological:  Positive for weakness.  All other systems reviewed and are negative.   Physical Exam Updated Vital Signs BP 132/61 (BP Location: Right Arm)   Pulse 71   Temp 98.3 F (36.8 C) (Oral)   Resp 15   Ht 5\' 8"  (1.727 m)   Wt 75.8 kg   SpO2 99%   BMI 25.39 kg/m  Physical Exam Vitals and nursing note reviewed.   Gen: NAD, pale appearing Eyes: PERRL, EOMI HEENT: no oropharyngeal swelling Neck: trachea midline Resp: clear to auscultation bilaterally Card: RRR, no murmurs, rubs, or gallops Abd: nontender, nondistended Extremities: no calf tenderness, no edema Vascular: 2+ radial pulses bilaterally, 2+ DP pulses bilaterally Skin: no rashes Psyc: acting appropriately   ED Results / Procedures / Treatments   Labs (all labs ordered are listed, but only abnormal results are displayed) Labs Reviewed  CBC WITH DIFFERENTIAL/PLATELET - Abnormal; Notable for the following components:      Result Value   RBC 2.63 (*)    Hemoglobin 8.9 (*)    HCT 26.4 (*)    MCV 100.4 (*)    Platelets 135 (*)    All other components within normal limits  COMPREHENSIVE METABOLIC PANEL WITH GFR - Abnormal; Notable for the following components:   BUN  30 (*)    Calcium  8.5 (*)    Total Protein 5.7 (*)    All other components within normal limits    EKG None  Radiology No results found.  Procedures Procedures    Medications Ordered in ED Medications  pantoprazole (PROTONIX) injection 80 mg (has no administration in time range)    ED Course/ Medical Decision Making/ A&P                                 Medical Decision Making 83 year old male with past medical history of hypertension, hyperlipidemia, and coronary artery disease on aspirin  presenting to the emergency department today with persistent generalized weakness and fatigue.  His labs drawn at triage  today do show that his hemoglobin has dropped to 8.9 from 10.9 which is a continuous drop.  The patient is hemodynamically stable here.  Will call and discuss his case with GI and our hospitalist team as he will require admission for symptomatic anemia given his worsening hemoglobin.  Patient's case discussed with our hospitalist team.  Patient be admitted for further evaluation management.  He is given Protonix here.  Amount and/or Complexity of Data Reviewed Labs: ordered.  Risk Prescription drug management. Decision regarding hospitalization.           Final Clinical Impression(s) / ED Diagnoses Final diagnoses:  Symptomatic anemia  Melena    Rx / DC Orders ED Discharge Orders     None         Carin Charleston, MD 03/08/24 2323

## 2024-03-09 ENCOUNTER — Other Ambulatory Visit

## 2024-03-09 DIAGNOSIS — I441 Atrioventricular block, second degree: Secondary | ICD-10-CM | POA: Diagnosis not present

## 2024-03-09 DIAGNOSIS — K922 Gastrointestinal hemorrhage, unspecified: Secondary | ICD-10-CM

## 2024-03-09 DIAGNOSIS — R197 Diarrhea, unspecified: Secondary | ICD-10-CM | POA: Diagnosis not present

## 2024-03-09 DIAGNOSIS — R61 Generalized hyperhidrosis: Secondary | ICD-10-CM | POA: Diagnosis not present

## 2024-03-09 DIAGNOSIS — K921 Melena: Secondary | ICD-10-CM

## 2024-03-09 DIAGNOSIS — I2510 Atherosclerotic heart disease of native coronary artery without angina pectoris: Secondary | ICD-10-CM

## 2024-03-09 DIAGNOSIS — D649 Anemia, unspecified: Secondary | ICD-10-CM | POA: Diagnosis not present

## 2024-03-09 LAB — HEMOGLOBIN AND HEMATOCRIT, BLOOD
HCT: 26.6 % — ABNORMAL LOW (ref 39.0–52.0)
HCT: 28.2 % — ABNORMAL LOW (ref 39.0–52.0)
Hemoglobin: 8.6 g/dL — ABNORMAL LOW (ref 13.0–17.0)
Hemoglobin: 9.6 g/dL — ABNORMAL LOW (ref 13.0–17.0)

## 2024-03-09 LAB — HEMOGLOBIN: Hemoglobin: 9 g/dL — ABNORMAL LOW (ref 13.0–17.0)

## 2024-03-09 LAB — TSH: TSH: 1.323 u[IU]/mL (ref 0.350–4.500)

## 2024-03-09 LAB — MAGNESIUM: Magnesium: 2.1 mg/dL (ref 1.7–2.4)

## 2024-03-09 LAB — T4, FREE: Free T4: 0.89 ng/dL (ref 0.61–1.12)

## 2024-03-09 LAB — HEMATOCRIT: HCT: 27.2 % — ABNORMAL LOW (ref 39.0–52.0)

## 2024-03-09 MED ORDER — ACETAMINOPHEN 650 MG RE SUPP
650.0000 mg | Freq: Four times a day (QID) | RECTAL | Status: DC | PRN
Start: 2024-03-09 — End: 2024-03-11

## 2024-03-09 MED ORDER — ONDANSETRON HCL 4 MG PO TABS: 4.0000 mg | ORAL_TABLET | Freq: Four times a day (QID) | ORAL | Status: DC | PRN

## 2024-03-09 MED ORDER — ACETAMINOPHEN 325 MG PO TABS: 650.0000 mg | ORAL_TABLET | Freq: Four times a day (QID) | ORAL | Status: DC | PRN

## 2024-03-09 MED ORDER — PANTOPRAZOLE SODIUM 40 MG IV SOLR
40.0000 mg | Freq: Two times a day (BID) | INTRAVENOUS | Status: DC
  Administered 2024-03-09 – 2024-03-11 (×5): 40 mg via INTRAVENOUS
  Filled 2024-03-09 (×5): qty 10

## 2024-03-09 MED ORDER — LACTATED RINGERS IV SOLN
INTRAVENOUS | Status: DC
Start: 2024-03-09 — End: 2024-03-11

## 2024-03-09 MED ORDER — ONDANSETRON HCL 4 MG/2ML IJ SOLN: 4.0000 mg | Freq: Four times a day (QID) | INTRAMUSCULAR | Status: DC | PRN

## 2024-03-09 NOTE — H&P (Signed)
 History and Physical    Patient: Tyler Ortega WGN:[id number] DOB: 03-05-1941 DOA: 03/08/2024 DOS: the patient was seen and examined on 03/09/2024 PCP: Ezell Hollow, MD  Patient coming from: [facility] Chief complaint: Chief Complaint  Patient presents with   Follow-up   HPI:  Tyler Ortega is a 82 y.o. 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  Patient states that he was very diaphoretic and he went to med center [facility] few days ago where they checked his blood and said his blood a drop but he was asymptomatic and decided to come home and have a recheck and as his recheck was further lower for the hemoglobin count patient was admitted to [facility].  And wife states that she has noticed that over the past few months he is becoming more more fatigue.  No other complaints today wife describes profuse diaphoresis during this episode on Friday.  Patient only uses occasional NSAIDs and is not a avid NSAID user.  Although he had used some ibuprofen for his low back pain few days ago after long trip and travel. ED Course: Pt in ed at bedside  is alert awake oriented. Vital signs in the ED were notable for the following: Patient has been bradycardic dyspneic hypotensive intermittently.  Orthostatics were negative. Vitals:   03/09/24 1040 03/09/24 1042 03/09/24 1230 03/09/24 1300  BP:  (!) 136/59 (!) 112/93 (!) 154/59  Pulse:  60 (!) 59 63  Temp: 98.2 F (36.8 C)     Resp:  (!) 22 17 20   Height:      Weight:      SpO2:  100% 97% (!) 88%  TempSrc: Oral     BMI  (Calculated):      >>ED evaluation thus far shows: CBC today shows new anemia with a hemoglobin of 8.9 MCV of 100.4 platelet counts of 135 and a normal white count. Repeat H&H today is 8.6/26. CMP done yesterday was showing a BUN of 30 with a calcium  of 8.5 normal electrolytes kidney function and LFT on comparison BUN day 4 yesterday was also elevated at 59. EKG showing Sinus bradycardia at 45 with second-degree Mobitz type I and ST depression in anterolateral leads. Fecal occult blood is positive.  >>While in the ED patient received the following: Medications  pantoprazole (PROTONIX) injection 40 mg (40 mg Intravenous Given 03/09/24 1156)  pantoprazole (PROTONIX) injection 80 mg (80 mg Intravenous Given 03/08/24 2341)     Review of Systems  Constitutional:  Positive for diaphoresis and malaise/fatigue.  Gastrointestinal:  Positive for blood in stool, diarrhea, melena, nausea and vomiting.       Hematemesis.   Neurological:  Positive for weakness.   Past Medical History:  Diagnosis Date   Arthritis    osteoarthritis -hips, back pain tx with Prednisone -last dose 09-04-15.   CAD (coronary artery disease)    MI 2011, seen @ [facility], sees Tax adviser (once a year)   Hyperlipidemia    Hypertension    Skin cancer    R calf and the ear (?side)  Transfusion history    2nd Hip surgery   Past Surgical History:  Procedure Laterality Date   BUBBLE STUDY  11/18/2019   Procedure: BUBBLE STUDY;  Surgeon: Hugh Madura, MD;  Location: [facility] ENDOSCOPY;  Service: Cardiovascular;;   COLONOSCOPY  05/29/11   Normal   CORONARY ARTERY BYPASS GRAFT     05-2010-x4 vessel [facility]   EYE SURGERY Bilateral 2019   cataract   HIP SURGERY Left    x3 - L hip (initial hip replacement bleed)   INGUINAL HERNIA REPAIR Left    PATENT FORAMEN OVALE(PFO) CLOSURE N/A 12/03/2019   Procedure: PATENT FORAMEN OVALE (PFO) CLOSURE;  Surgeon: Arnoldo Lapping, MD;  Location: [facility] INVASIVE CV LAB;  Service:  Cardiovascular;  Laterality: N/A;   TEE WITHOUT CARDIOVERSION N/A 11/18/2019   Procedure: TRANSESOPHAGEAL ECHOCARDIOGRAM (TEE);  Surgeon: Hugh Madura, MD;  Location: [facility] ENDOSCOPY;  Service: Cardiovascular;  Laterality: N/A;   TOTAL HIP ARTHROPLASTY Right 09/12/2015   Procedure: RIGHT TOTAL HIP ARTHROPLASTY ANTERIOR APPROACH;  Surgeon: Claiborne Crew, MD;  Location: [facility] ORS;  Service: Orthopedics;  Laterality: Right;   VASECTOMY      reports that he has never smoked. He has never used smokeless tobacco. He reports current alcohol use. He reports that he does not use drugs. No Known Allergies Family History  Problem Relation Age of Onset   Other Mother 59       "old age"   Other Father 51       tumor on neck   Coronary artery disease Neg Hx    Diabetes Neg Hx    Colon cancer Neg Hx    Prostate cancer Neg Hx    Seizures Neg Hx    Multiple sclerosis Neg Hx    Migraines Neg Hx    Dementia Neg Hx    Prior to Admission medications   Medication Sig Start Date End Date Taking? Authorizing Provider  amoxicillin  (AMOXIL ) 500 MG capsule Take 2,000 mg by mouth daily as needed (dental work). Dental visits 11/28/20  Yes [provider]  aspirin  EC 81 MG tablet Take 81 mg by mouth daily. Swallow whole.   Yes [provider]  atorvastatin  (LIPITOR) 80 MG tablet Take 1 tablet by mouth once daily 12/02/23  Yes Paz, Jose E, MD  Cyanocobalamin  (VITAMIN B-12 PO) Take 1 tablet by mouth daily.   Yes [provider]  Multiple Vitamins-Minerals (MULTIVITAMIN MEN 50+) TABS Take 1 tablet by mouth daily.   Yes [provider]  nitroGLYCERIN  (NITROSTAT ) 0.4 MG SL tablet Place 1 tablet (0.4 mg total) under the tongue every 5 (five) minutes x 3 doses as needed for chest pain. 03/17/23  Yes Arnoldo Lapping, MD                                                                                 Vitals:   03/09/24 1040 03/09/24 1042 03/09/24 1230 03/09/24 1300  BP:  (!) 136/59 (!) 112/93  (!) 154/59  Pulse:  60 (!) 59 63  Resp:  (!) 22 17 20   Temp: 98.2 F (36.8 C)     TempSrc: Oral     SpO2:  100% 97% [name])  88%  Weight:      Height:       Physical Exam Vitals reviewed.  Constitutional:      General: He is not in acute distress.    Appearance: He is normal weight. He is not ill-appearing.     Comments: Pale appearing.  HENT:     Head: Normocephalic and atraumatic.     Right Ear: External ear normal.     Left Ear: External ear normal.     Mouth/Throat:     Mouth: Mucous membranes are moist.  Eyes:     Extraocular Movements: Extraocular movements intact.  Cardiovascular:     Rate and Rhythm: Regular rhythm. Bradycardia present.     Heart sounds: Normal heart sounds.  Pulmonary:     Effort: Pulmonary effort is normal.     Breath sounds: Normal breath sounds.  Abdominal:     General: Bowel sounds are normal.     Palpations: Abdomen is soft.  Musculoskeletal:     Right lower leg: No edema.     Left lower leg: No edema.  Neurological:     General: No focal deficit present.     Mental Status: He is alert and oriented to person, place, and time.     Labs on Admission: I have personally reviewed following labs and imaging studies CBC: Recent Labs  Lab 03/06/24 1228 03/08/24 1904 03/09/24 1158  WBC 7.3 7.3  --   NEUTROABS 5.5 4.8  --   HGB 10.9* 8.9* 8.6*  HCT 32.5* 26.4* 26.6*  MCV 101.2* 100.4*  --   PLT 142* 135*  --    Basic Metabolic Panel: Recent Labs  Lab 03/06/24 1228 03/08/24 1904  NA 141 143  K 4.2 3.6  CL 110 111  CO2 22 23  GLUCOSE 81 94  BUN 59* 30*  CREATININE 1.04 1.15  CALCIUM  8.9 8.5*   GFR: Estimated Creatinine Clearance: 47.9 mL/min (by C-G formula based on SCr of 1.15 mg/dL). Liver Function Tests: Recent Labs  Lab 03/06/24 1228 03/08/24 1904  AST 24 31  ALT 15 17  ALKPHOS 60 57  BILITOT 0.2 0.3  PROT 5.9* 5.7*  ALBUMIN 3.8 3.8   Recent Labs  Lab 03/06/24 1228  LIPASE 18   No results for input(s): "AMMONIA"  in the last 168 hours. Coagulation Profile: No results for input(s): "INR", "PROTIME" in the last 168 hours. Cardiac Enzymes: No results for input(s): "CKTOTAL", "CKMB", "CKMBINDEX", "TROPONINI" in the last 168 hours. BNP (last 3 results) No results for input(s): "PROBNP" in the last 8760 hours. HbA1C: No results for input(s): "HGBA1C" in the last 72 hours. CBG: No results for input(s): "GLUCAP" in the last 168 hours. Lipid Profile: No results for input(s): "CHOL", "HDL", "LDLCALC", "TRIG", "CHOLHDL", "LDLDIRECT" in the last 72 hours. Thyroid  Function Tests: No results for input(s): "TSH", "T4TOTAL", "FREET4", "T3FREE", "THYROIDAB" in the last 72 hours. Anemia Panel: No results for input(s): "VITAMINB12", "FOLATE", "FERRITIN", "TIBC", "IRON", "RETICCTPCT" in the last 72 hours. Urine analysis:    Component Value Date/Time   COLORURINE YELLOW 03/06/2024 1228   APPEARANCEUR CLEAR 03/06/2024 1228   LABSPEC 1.020 03/06/2024 1228   PHURINE 5.5 03/06/2024 1228   GLUCOSEU NEGATIVE 03/06/2024 1228   HGBUR NEGATIVE 03/06/2024 1228   BILIRUBINUR NEGATIVE 03/06/2024 1228   KETONESUR NEGATIVE 03/06/2024 1228   PROTEINUR NEGATIVE 03/06/2024 1228   NITRITE NEGATIVE 03/06/2024 1228   LEUKOCYTESUR NEGATIVE 03/06/2024 1228   Radiological Exams on Admission: No results found. Data Reviewed: Relevant  notes from primary care and specialist visits, past discharge summaries as available in EHR, including Care Everywhere . Prior diagnostic testing as pertinent to current admission diagnoses, Updated medications and problem lists for reconciliation .ED course, including vitals, labs, imaging, treatment and response to treatment,Triage notes, nursing and pharmacy notes and ED provider's notes.Notable results as noted in HPI.Discussed case with EDMD/ ED APP/ or Specialty MD on call and as needed.  Assessment & Plan  >> Hematemesis/melena/GI bleed: NPO, MIVF with LR at 50 cc an hour. IV PPI. Appreciate  GI consults done stat in the emergency room. H&H every 8 hours.  >> CAD/bradycardia: Cardiology consulted. Currently aspirin  81 mg on hold. Low threshold for ICU admission if patient needs pacing. TSH/FT4/ magnesium  .   DVT prophylaxis:  SCD's  Consults:  GI: [facility]. Cardiology Consult:CM.  Advance Care Planning:    Code Status: Prior   Family Communication:  Wife  Disposition Plan:  Home  Severity of Illness: The appropriate patient status for this patient is OBSERVATION. Observation status is judged to be reasonable and necessary in order to provide the required intensity of service to ensure the patient's safety. The patient's presenting symptoms, physical exam findings, and initial radiographic and laboratory data in the context of their medical condition is felt to place them at decreased risk for further clinical deterioration. Furthermore, it is anticipated that the patient will be medically stable for discharge from the hospital within 2 midnights of admission.   Unresulted Labs (From admission, onward)     Start     Ordered   03/09/24 1147  Hemoglobin and hematocrit, blood  Now then every 8 hours,   R (with TIMED occurrences)     Comments: Call MD for hgb  7.5 or less    03/09/24 1147            Meds ordered this encounter  Medications   pantoprazole (PROTONIX) injection 80 mg   pantoprazole (PROTONIX) injection 40 mg     Orders Placed This Encounter  Procedures   CBC with Differential   Comprehensive metabolic panel   Hemoglobin and hematocrit, blood   Diet full liquid Room service appropriate? Yes; Fluid consistency: Thin   Cardiac Monitoring Continuous x 24 hours Indications for use: Other; other indications for use: monitor for acute ischemic changes   Commode at bedside   Consult to hospitalist   EKG 12-Lead   Repeat EKG   EKG 12-Lead   EKG 12-Lead   EKG   EKG   EKG   EKG   Insert peripheral IV   Place in observation (patient's expected  length of stay will be less than 2 midnights)    Author: Lavanda Porter, MD 12 pm- 8 pm. [facility]. 03/09/2024 1:19 PM >>Please note for any concern,or critical results after hours past 8pm please contact the [facility] floor coverage provider from 7 PM- 7 AM. For on call review [email or web address], username [email or web address] and PW: your phone number<<

## 2024-03-09 NOTE — ED Notes (Signed)
 Pt transferred via [facility] from [facility]. Pt arrived for progressive weakness and fatigue from bloody stools. Pt states he has not had bloody stools in 1 day. Pt was found to be in 2nd degree HB type 1 that progressed to type 2 with rates of 40s. Pt denies lightheadedness or dizziness and was able to stand and walk from [facility] stretcher with no issues. All other VSS upon arrival.

## 2024-03-09 NOTE — ED Provider Notes (Addendum)
 Pt is boarding pending bed at [facility] Will transfer ER to ER Dr Val Garin accepting    Teddi Favors, DO 03/09/24 0914   On telemetry concern for mobitz 2, rpt EKG concerning for mobitz 2 Pads applied, continue telemetry Pt already admitted to [facility] Cardiology was consulted, will see at [facility] Continue plan for admission > send ED to ED    CRITICAL CARE Performed by: Teddi Favors   Total critical care time: 31 minutes  Critical care time was exclusive of separately billable procedures and treating other patients.  Critical care was necessary to treat or prevent imminent or life-threatening deterioration.  Critical care was time spent personally by me on the following activities: development of treatment plan with patient and/or surrogate as well as nursing, discussions with consultants, evaluation of patient's response to treatment, examination of patient, obtaining history from patient or surrogate, ordering and performing treatments and interventions, ordering and review of laboratory studies, ordering and review of radiographic studies, pulse oximetry and re-evaluation of patient's condition. New av block, mobitz 2.      Teddi Favors, DO 03/09/24 [phone]

## 2024-03-09 NOTE — Consult Note (Signed)
 Cardiology Consultation   Patient ID: RAN TULLIS MRN: [id number]; DOB: June 16, 1941  Admit date: 03/08/2024 Date of Consult: 03/09/2024  PCP:  Ezell Hollow, MD   [facility] Providers Cardiologist:  None        Patient Profile: Tyler Ortega is a 83 y.o. male with a hx of CAD (prior CABG and PCI), prior PFO closure (12/03/19) carotid artery stenosis, hypertension, hyperlipidemia,  who is being seen 03/09/2024 for the evaluation of newly observed AV block at the request of Dr. Lydia Sams.  History of Present Illness: Tyler Ortega presented to the ED on 5/31 reporting nausea with vomiting and diarrhea that began suddenly the night before. Per patient, significant diarrhea volume. He also noted diaphoresis which he associated with diarrhea. Stools described as dark/black in color. Workup noted HGB decreased to 10.4 from 14. Appears that fecal occult test was positive. Given lack of recurrent black stools, patient discharged home with instructions for close PCP follow up. Patient presented back to the ED on 6/2 for repeat labs given ongoing fatigue/weakness. Reported seeing more dark stools. Repeat HGB lower at 8.9. Due to further melena and decreasing hemoglobin, patient admitted/transferred to [facility] from [facility] ED. In the ED at [facility], telemetry and ECG noted with a mix of second degree type I and type II AVB. Lowest HR appears to have briefly been 28bpm. Cardiology consulted by GI prior to EGD given brady-arrhythmia.   Patient reports being quite active at baseline. He still works 3 days per week and regularly goes to play golf. He didn't realize it has been over 3 years since seeing cardiology for follow up, hasn't thought about follow up due to feeling so good. Denies any recent chest pain, palpitations, near syncope. Admits to dizziness/lightheadedness in the setting of vomiting/severe diarrhea last week but otherwise hasn't had these symptoms.    Past Medical History:  Diagnosis Date    Arthritis    osteoarthritis -hips, back pain tx with Prednisone -last dose 09-04-15.   CAD (coronary artery disease)    MI 2011, seen @ [facility], sees Tax adviser (once a year)   Hyperlipidemia    Hypertension    Skin cancer    R calf and the ear (?side)   Transfusion history    2nd Hip surgery    Past Surgical History:  Procedure Laterality Date   BUBBLE STUDY  11/18/2019   Procedure: BUBBLE STUDY;  Surgeon: Hugh Madura, MD;  Location: [facility] ENDOSCOPY;  Service: Cardiovascular;;   COLONOSCOPY  05/29/11   Normal   CORONARY ARTERY BYPASS GRAFT     05-2010-x4 vessel [facility]   EYE SURGERY Bilateral 2019   cataract   HIP SURGERY Left    x3 - L hip (initial hip replacement bleed)   INGUINAL HERNIA REPAIR Left    PATENT FORAMEN OVALE(PFO) CLOSURE N/A 12/03/2019   Procedure: PATENT FORAMEN OVALE (PFO) CLOSURE;  Surgeon: Arnoldo Lapping, MD;  Location: [facility] INVASIVE CV LAB;  Service: Cardiovascular;  Laterality: N/A;   TEE WITHOUT CARDIOVERSION N/A 11/18/2019   Procedure: TRANSESOPHAGEAL ECHOCARDIOGRAM (TEE);  Surgeon: Hugh Madura, MD;  Location: [facility] ENDOSCOPY;  Service: Cardiovascular;  Laterality: N/A;   TOTAL HIP ARTHROPLASTY Right 09/12/2015   Procedure: RIGHT TOTAL HIP ARTHROPLASTY ANTERIOR APPROACH;  Surgeon: Claiborne Crew, MD;  Location: [facility] ORS;  Service: Orthopedics;  Laterality: Right;   VASECTOMY       Home Medications:  Prior to Admission medications   Medication Sig Start Date End Date Taking? Authorizing Provider  amoxicillin  (AMOXIL ) 500 MG capsule Take 2,000 mg by mouth daily as needed (dental work). Dental visits 11/28/20  Yes [provider]  aspirin  EC 81 MG tablet Take 81 mg by mouth daily. Swallow whole.   Yes [provider]  atorvastatin  (LIPITOR) 80 MG tablet Take 1 tablet by mouth once daily 12/02/23  Yes Paz, Jose E, MD  Cyanocobalamin  (VITAMIN B-12 PO) Take 1 tablet by mouth daily.   Yes [provider]  Multiple  Vitamins-Minerals (MULTIVITAMIN MEN 50+) TABS Take 1 tablet by mouth daily.   Yes [provider]  nitroGLYCERIN  (NITROSTAT ) 0.4 MG SL tablet Place 1 tablet (0.4 mg total) under the tongue every 5 (five) minutes x 3 doses as needed for chest pain. 03/17/23  Yes Arnoldo Lapping, MD    Scheduled Meds:  pantoprazole (PROTONIX) IV  40 mg Intravenous Q12H   Continuous Infusions:  PRN Meds:   Allergies:   No Known Allergies  Social History:   Social History   Socioeconomic History   Marital status: Married    Spouse name: Stana Ear   Number of children: 3   Years of education: Some college   Highest education level: Not on file  Occupational History   Occupation: Psychologist, sport and exercise, Retired    Associate Professor: OTHER  Tobacco Use   Smoking status: Never   Smokeless tobacco: Never  Vaping Use   Vaping status: Never Used  Substance and Sexual Activity   Alcohol use: Yes    Comment: rarely 1 glass of wine every 6 mths., last 6 months ago   Drug use: No   Sexual activity: Not on file  Other Topics Concern   Not on file  Social History Narrative   3 children from previous marriage, lives w/ wife,   Still working 3 days per week @ [employer]      Caffeine use: 1-2 cups coffee per day   Soda: diet pepsi a few times a week   Right handed   Social Drivers of Corporate investment banker Strain: Low Risk  (07/22/2023)   Overall Financial Resource Strain (CARDIA)    Difficulty of Paying Living Expenses: Not hard at all  Food Insecurity: No Food Insecurity (07/22/2023)   Hunger Vital Sign    Worried About Running Out of Food in the Last Year: Never true    Ran Out of Food in the Last Year: Never true  Transportation Needs: No Transportation Needs (07/22/2023)   PRAPARE - Administrator, Civil Service (Medical): No    Lack of Transportation (Non-Medical): No  Physical Activity: Sufficiently Active (07/22/2023)   Exercise Vital Sign    Days of Exercise per Week: 6 days     Minutes of Exercise per Session: 40 min  Stress: No Stress Concern Present (07/22/2023)   [employer] of Occupational Health - Occupational Stress Questionnaire    Feeling of Stress : Not at all  Social Connections: Socially Integrated (07/22/2023)   Social Connection and Isolation Panel [NHANES]    Frequency of Communication with Friends and Family: More than three times a week    Frequency of Social Gatherings with Friends and Family: More than three times a week    Attends Religious Services: More than 4 times per year    Active Member of [employer] or Organizations: Yes    Attends Banker Meetings: More than 4 times per year    Marital Status: Married  Catering manager Violence: Not At Risk (07/23/2023)  Humiliation, Afraid, Rape, and Kick questionnaire    Fear of Current or Ex-Partner: No    Emotionally Abused: No    Physically Abused: No    Sexually Abused: No    Family History:    Family History  Problem Relation Age of Onset   Other Mother 42       "old age"   Other Father 90       tumor on neck   Coronary artery disease Neg Hx    Diabetes Neg Hx    Colon cancer Neg Hx    Prostate cancer Neg Hx    Seizures Neg Hx    Multiple sclerosis Neg Hx    Migraines Neg Hx    Dementia Neg Hx      ROS:  Please see the history of present illness.   All other ROS reviewed and negative.     Physical Exam/Data: Vitals:   03/09/24 1040 03/09/24 1042 03/09/24 1230 03/09/24 1300  BP:  (!) 136/59 (!) 112/93 (!) 154/59  Pulse:  60 (!) 59 63  Resp:  (!) 22 17 20   Temp: 98.2 F (36.8 C)     TempSrc: Oral     SpO2:  100% 97% (!) 88%  Weight:      Height:       No intake or output data in the 24 hours ending 03/09/24 1314    03/08/2024    7:01 PM 03/06/2024   11:49 AM 12/23/2023    9:55 AM  Last 3 Weights  Weight (lbs) 167 lb 164 lb 173 lb 4 oz  Weight (kg) 75.751 kg 74.39 kg 78.586 kg     Body mass index is 25.39 kg/m.  General:  Well nourished, well  developed, in no acute distress HEENT: normal Neck: no JVD Vascular: No carotid bruits; Distal pulses 2+ bilaterally Cardiac:  normal S1, S2; RRR; no murmur  Lungs:  clear to auscultation bilaterally, no wheezing, rhonchi or rales  Abd: soft, nontender, no hepatomegaly  Ext: no edema Musculoskeletal:  No deformities, BUE and BLE strength normal and equal Skin: warm and dry  Neuro:  CNs 2-12 intact, no focal abnormalities noted Psych:  Normal affect   EKG:  The EKG was personally reviewed and demonstrates:  Most recent ECG with significant first degree AVB. Also seen this admission, Mobitz I and II.  Telemetry:  Telemetry was personally reviewed and demonstrates:  currently sinus rhythm with 1st degree AVB, HR 60s. Telemetry across [facility] and [facility] reviewed. Isolated Mobitz 2 with HR as low as 28. Otherwise Mobitz I seen.  Relevant CV Studies: 11/30/20 TTE  IMPRESSIONS     1. Left ventricular ejection fraction, by estimation, is 50%. The left  ventricle has mildly decreased function. The left ventricle demonstrates  regional wall motion abnormalities with mild septal hypokinesis. Left  ventricular diastolic parameters are  consistent with Grade I diastolic dysfunction (impaired relaxation).   2. Right ventricular systolic function is mildly reduced. The right  ventricular size is mildly enlarged. There is normal pulmonary artery  systolic pressure. The estimated right ventricular systolic pressure is  29.9 mmHg.   3. The mitral valve is normal in structure. Trivial mitral valve  regurgitation. No evidence of mitral stenosis.   4. The aortic valve is tricuspid. Aortic valve regurgitation is not  visualized. Mild aortic valve sclerosis is present, with no evidence of  aortic valve stenosis.   5. Aortic dilatation noted. There is mild dilatation of the ascending  aorta,  measuring 39 mm.   6. Negative bubble study, no evidence for residual PFO. The patient is  s/p PFO closure, PFO  closure device well-seated.   7. The inferior vena cava is normal in size with <50% respiratory  variability, suggesting right atrial pressure of 8 mmHg.   8. The patient appears to be in 2nd degree heart block.   FINDINGS   Left Ventricle: Left ventricular ejection fraction, by estimation, is  50%. The left ventricle has mildly decreased function. The left ventricle  demonstrates regional wall motion abnormalities. The left ventricular  internal cavity size was normal in  size. There is no left ventricular hypertrophy. Left ventricular diastolic  parameters are consistent with Grade I diastolic dysfunction (impaired  relaxation).   Right Ventricle: The right ventricular size is mildly enlarged. No  increase in right ventricular wall thickness. Right ventricular systolic  function is mildly reduced. There is normal pulmonary artery systolic  pressure. The tricuspid regurgitant velocity   is 2.34 m/s, and with an assumed right atrial pressure of 8 mmHg, the  estimated right ventricular systolic pressure is 29.9 mmHg.   Left Atrium: Left atrial size was normal in size.   Right Atrium: Right atrial size was normal in size.   Pericardium: There is no evidence of pericardial effusion.   Mitral Valve: The mitral valve is normal in structure. Trivial mitral  valve regurgitation. No evidence of mitral valve stenosis.   Tricuspid Valve: The tricuspid valve is normal in structure. Tricuspid  valve regurgitation is trivial.   Aortic Valve: The aortic valve is tricuspid. Aortic valve regurgitation is  not visualized. Mild aortic valve sclerosis is present, with no evidence  of aortic valve stenosis.   Pulmonic Valve: The pulmonic valve was normal in structure. Pulmonic valve  regurgitation is trivial.   Aorta: Aortic dilatation noted. There is mild dilatation of the ascending  aorta, measuring 39 mm.   Venous: The inferior vena cava is normal in size with less than 50%  respiratory  variability, suggesting right atrial pressure of 8 mmHg.   IAS/Shunts: Negative bubble study, no evidence for residual PFO. The  patient is s/p PFO closure, PFO closure device well-seated. Agitated  saline contrast was given intravenously to evaluate for intracardiac  shunting.    Laboratory Data: High Sensitivity Troponin:  No results for input(s): "TROPONINIHS" in the last 720 hours.   Chemistry Recent Labs  Lab 03/06/24 1228 03/08/24 1904  NA 141 143  K 4.2 3.6  CL 110 111  CO2 22 23  GLUCOSE 81 94  BUN 59* 30*  CREATININE 1.04 1.15  CALCIUM  8.9 8.5*  GFRNONAA >60 >60  ANIONGAP 9 9    Recent Labs  Lab 03/06/24 1228 03/08/24 1904  PROT 5.9* 5.7*  ALBUMIN 3.8 3.8  AST 24 31  ALT 15 17  ALKPHOS 60 57  BILITOT 0.2 0.3   Lipids No results for input(s): "CHOL", "TRIG", "HDL", "LABVLDL", "LDLCALC", "CHOLHDL" in the last 168 hours.  Hematology Recent Labs  Lab 03/06/24 1228 03/08/24 1904 03/09/24 1158  WBC 7.3 7.3  --   RBC 3.21* 2.63*  --   HGB 10.9* 8.9* 8.6*  HCT 32.5* 26.4* 26.6*  MCV 101.2* 100.4*  --   MCH 34.0 33.8  --   MCHC 33.5 33.7  --   RDW 13.6 13.6  --   PLT 142* 135*  --    Thyroid  No results for input(s): "TSH", "FREET4" in the last 168 hours.  BNPNo results for  input(s): "BNP", "PROBNP" in the last 168 hours.  DDimer No results for input(s): "DDIMER" in the last 168 hours.  Radiology/Studies:  No results found.   Assessment and Plan:  AV Block, second degree type I and II  Patient admitted with acute anemia secondary to suspected upper GI bleed noted to have both Mobitz I and II AVB on ECG and telemetry today. In review of his chart, appears to have had longstanding first degree AVB with transient Mobitz I seen during cardiac admission at [facility] in October 2018. Mobitz I with HR of 43 also seen transiently in 2021. Mobitz I with HR 41 documented in cardiology clinic 2022.  Overall low acute concern regarding patient's  arrhythmia given previously noted and similar appearing AVB. Isolated bradycardia in the high 20s this admission but most HR between 40-70. Patient denies pre-syncope This appears to be similar conduction disease to what has previously been noted.  No further inpatient cardiac testing indicated at this time. Continue to monitor inpatient telemetry and avoid AV nodal blocking agents. Would consider having pacing pads and Atropine available during EGD as a precaution.  Would recommend static Zio monitor x14 days at discharge to further assess baseline rhythm. Will need to re-establish for more regular cardiology follow up.  CAD s/p CABG Patient with hx CABG 2011 and PCI with stent placement in 2018 (complicated by GI bleeding/hematemesis). No cardiac symptoms reported.  Continue Atorvastatin  80mg  ASA currently on hold. Once EGD completed, would consider Plavix  75mg  monotherapy given its favorable GI bleeding profile when compared with ASA.  Anemia with suspected Upper GI bleed Per primary team.  Risk Assessment/Risk Scores:              For questions or updates, please contact [facility] Please consult [email or web address] for contact info under    Signed, Leala Prince, PA-C  03/09/2024 1:14 PM

## 2024-03-09 NOTE — ED Notes (Signed)
 CCMD called to be added to the system

## 2024-03-09 NOTE — Consult Note (Signed)
 Consultation  Referring Provider: ERMMD/Gray Primary Care Physician:  Ezell Hollow, MD Primary Gastroenterologist:  [facility] 2018/ upcoming appt Dr Dominic Friendly  Reason for Consultation: Symptomatic anemia, dark stools  HPI: Tyler Ortega is a 83 y.o. male with history of coronary artery disease, status post MI 2011, and status post cardiac cath with stent placement 2018 also with history of hypertension, hyperlipidemia and osteoarthritis. By review of chart patient had a GI bleed in 2018 while he was hospitalized at outside facility.  Patient says he had just undergone cardiac cath with stent placement, had been anticoagulated and vomited a large amount of dark red blood.  EGD at that time on 07/07/2017 showed antral gastritis and there was a small GE junction ulceration that was hemoclipped.  Last colonoscopy here Dr. Willy Harvest 2012 was normal.  Patient presents now stating that he had an episode on Friday evening 03/05/2024 with multiple episodes of very dark black diarrhea throughout the night.  This was associated with diaphoresis, weakness and some nausea but no vomiting.  He had no associated abdominal pain. Following morning he did go to the emergency room but by that time he had had a more formed stool that was still black.  He was tested and was Hemoccult positive but at that time had hemoglobin of 10.9 (down from his baseline of 14.4 in March 2025) and was allowed discharge to home.  He had upcoming appointment with Dr. Dominic Friendly scheduled. Was asked to come back to the emergency room yesterday for repeat hemoglobin.  He says that he has continued to feel weak but actually went to work yesterday to his part-time job.  He then went to get his hemoglobin checked.  This had dropped to 8.9 and decision was made to admit him. Over the past 2 days no complaints of nausea or vomiting no abdominal pain and did not have a bowel movement yesterday or today thus far.  Usually takes a baby aspirin   daily, he says his back had been bothering him recently and he was taking about 8 ibuprofen per day for 2 to 3-day.  Last week.  EKG last p.m. noticed new AV block concerning for Mobitz 2.  He has been hemodynamically stable, heart rate currently 50.  Patient transferred from [facility] to [facility], ER this morning awaiting bed.   Past Medical History:  Diagnosis Date   Arthritis    osteoarthritis -hips, back pain tx with Prednisone -last dose 09-04-15.   CAD (coronary artery disease)    MI 2011, seen @ [facility], sees Tax adviser (once a year)   Hyperlipidemia    Hypertension    Skin cancer    R calf and the ear (?side)   Transfusion history    2nd Hip surgery    Past Surgical History:  Procedure Laterality Date   BUBBLE STUDY  11/18/2019   Procedure: BUBBLE STUDY;  Surgeon: Hugh Madura, MD;  Location: [facility] ENDOSCOPY;  Service: Cardiovascular;;   COLONOSCOPY  05/29/11   Normal   CORONARY ARTERY BYPASS GRAFT     05-2010-x4 vessel [facility]   EYE SURGERY Bilateral 2019   cataract   HIP SURGERY Left    x3 - L hip (initial hip replacement bleed)   INGUINAL HERNIA REPAIR Left    PATENT FORAMEN OVALE(PFO) CLOSURE N/A 12/03/2019   Procedure: PATENT FORAMEN OVALE (PFO) CLOSURE;  Surgeon: Arnoldo Lapping, MD;  Location: [facility] INVASIVE CV LAB;  Service: Cardiovascular;  Laterality: N/A;   TEE  WITHOUT CARDIOVERSION N/A 11/18/2019   Procedure: TRANSESOPHAGEAL ECHOCARDIOGRAM (TEE);  Surgeon: Hugh Madura, MD;  Location: [facility] ENDOSCOPY;  Service: Cardiovascular;  Laterality: N/A;   TOTAL HIP ARTHROPLASTY Right 09/12/2015   Procedure: RIGHT TOTAL HIP ARTHROPLASTY ANTERIOR APPROACH;  Surgeon: Claiborne Crew, MD;  Location: [facility] ORS;  Service: Orthopedics;  Laterality: Right;   VASECTOMY      Prior to Admission medications   Medication Sig Start Date End Date Taking? Authorizing Provider  amoxicillin  (AMOXIL ) 500 MG capsule Take 2,000 mg by mouth daily as needed (dental  work). Dental visits 11/28/20  Yes [provider]  aspirin  EC 81 MG tablet Take 81 mg by mouth daily. Swallow whole.   Yes [provider]  atorvastatin  (LIPITOR) 80 MG tablet Take 1 tablet by mouth once daily 12/02/23  Yes Paz, Jose E, MD  nitroGLYCERIN  (NITROSTAT ) 0.4 MG SL tablet Place 1 tablet (0.4 mg total) under the tongue every 5 (five) minutes x 3 doses as needed for chest pain. 03/17/23  Yes Arnoldo Lapping, MD    No current facility-administered medications for this encounter.   Current Outpatient Medications  Medication Sig Dispense Refill   amoxicillin  (AMOXIL ) 500 MG capsule Take 2,000 mg by mouth daily as needed (dental work). Dental visits     aspirin  EC 81 MG tablet Take 81 mg by mouth daily. Swallow whole.     atorvastatin  (LIPITOR) 80 MG tablet Take 1 tablet by mouth once daily 90 tablet 0   nitroGLYCERIN  (NITROSTAT ) 0.4 MG SL tablet Place 1 tablet (0.4 mg total) under the tongue every 5 (five) minutes x 3 doses as needed for chest pain. 25 tablet 0   Facility-Administered Medications Ordered in Other Encounters  Medication Dose Route Frequency Provider Last Rate Last Admin   sodium chloride  flush (NS) 0.9 % injection 10 mL  10 mL Intravenous PRN Ardia Kraft, PA-C   10 mL at 11/30/20 1324    Allergies as of 03/08/2024   (No Known Allergies)    Family History  Problem Relation Age of Onset   Other Mother 38       "old age"   Other Father 23       tumor on neck   Coronary artery disease Neg Hx    Diabetes Neg Hx    Colon cancer Neg Hx    Prostate cancer Neg Hx    Seizures Neg Hx    Multiple sclerosis Neg Hx    Migraines Neg Hx    Dementia Neg Hx     Social History   Socioeconomic History   Marital status: Married    Spouse name: Stana Ear   Number of children: 3   Years of education: Some college   Highest education level: Not on file  Occupational History   Occupation: Psychologist, sport and exercise, Retired    Associate Professor: OTHER  Tobacco Use    Smoking status: Never   Smokeless tobacco: Never  Vaping Use   Vaping status: Never Used  Substance and Sexual Activity   Alcohol use: Yes    Comment: rarely 1 glass of wine every 6 mths., last 6 months ago   Drug use: No   Sexual activity: Not on file  Other Topics Concern   Not on file  Social History Narrative   3 children from previous marriage, lives w/ wife,   Still working 3 days per week @ [employer]      Caffeine use: 1-2 cups coffee per day   Soda: diet  pepsi a few times a week   Right handed   Social Drivers of Health   Financial Resource Strain: Low Risk  (07/22/2023)   Overall Financial Resource Strain (CARDIA)    Difficulty of Paying Living Expenses: Not hard at all  Food Insecurity: No Food Insecurity (07/22/2023)   Hunger Vital Sign    Worried About Running Out of Food in the Last Year: Never true    Ran Out of Food in the Last Year: Never true  Transportation Needs: No Transportation Needs (07/22/2023)   PRAPARE - Administrator, Civil Service (Medical): No    Lack of Transportation (Non-Medical): No  Physical Activity: Sufficiently Active (07/22/2023)   Exercise Vital Sign    Days of Exercise per Week: 6 days    Minutes of Exercise per Session: 40 min  Stress: No Stress Concern Present (07/22/2023)   [employer] of Occupational Health - Occupational Stress Questionnaire    Feeling of Stress : Not at all  Social Connections: Socially Integrated (07/22/2023)   Social Connection and Isolation Panel [NHANES]    Frequency of Communication with Friends and Family: More than three times a week    Frequency of Social Gatherings with Friends and Family: More than three times a week    Attends Religious Services: More than 4 times per year    Active Member of [employer] or Organizations: Yes    Attends Engineer, structural: More than 4 times per year    Marital Status: Married  Catering manager Violence: Not At Risk (07/23/2023)    Humiliation, Afraid, Rape, and Kick questionnaire    Fear of Current or Ex-Partner: No    Emotionally Abused: No    Physically Abused: No    Sexually Abused: No    Review of Systems: Pertinent positive and negative review of systems were noted in the above HPI section.  All other review of systems was otherwise negative.   Physical Exam: Vital signs in last 24 hours: Temp:  [97.7 F (36.5 C)-98.3 F (36.8 C)] 98.2 F (36.8 C) (06/03 1040) Pulse Rate:  [26-94] 60 (06/03 1042) Resp:  [12-22] 22 (06/03 1042) BP: (102-136)/(54-88) 136/59 (06/03 1042) SpO2:  [83 %-100 %] 100 % (06/03 1042) Weight:  [75.8 kg] 75.8 kg (06/02 1901)   General:   Alert,  Well-developed, well-nourished, elderly white male pleasant and cooperative in NAD, in good spirits Head:  Normocephalic and atraumatic. Eyes:  Sclera clear, no icterus.   Conjunctiva pink Ears:  Normal auditory acuity. Nose:  No deformity, discharge,  or lesions. Mouth:  No deformity or lesions.   Neck:  Supple; no masses or thyromegaly. Lungs:  Clear throughout to auscultation.   No wheezes, crackles, or rhonchi.  Heart:  Regular rate and rhythm; no murmurs, clicks, rubs,  or gallops. Abdomen:  Soft,nontender, BS active,nonpalp mass or hsm.   Rectal: Not repeated documented Hemoccult positive Msk:  Symmetrical without gross deformities. . Pulses:  Normal pulses noted. Extremities:  Without clubbing or edema. Neurologic:  Alert and  oriented x4;  grossly normal neurologically. Skin:  Intact without significant lesions or rashes.. Psych:  Alert and cooperative. Normal mood and affect.  Intake/Output from previous day: No intake/output data recorded. Intake/Output this shift: No intake/output data recorded.  Lab Results: Recent Labs    03/06/24 1228 03/08/24 1904  WBC 7.3 7.3  HGB 10.9* 8.9*  HCT 32.5* 26.4*  PLT 142* 135*   BMET Recent Labs    03/06/24 1228 03/08/24  1904  NA 141 143  K 4.2 3.6  CL 110 111  CO2 22  23  GLUCOSE 81 94  BUN 59* 30*  CREATININE 1.04 1.15  CALCIUM  8.9 8.5*   LFT Recent Labs    03/08/24 1904  PROT 5.7*  ALBUMIN 3.8  AST 31  ALT 17  ALKPHOS 57  BILITOT 0.3   PT/INR No results for input(s): "LABPROT", "INR" in the last 72 hours. Hepatitis Panel No results for input(s): "HEPBSAG", "HCVAB", "HEPAIGM", "HEPBIGM" in the last 72 hours.    IMPRESSION:  #6 83 year old white male with acute upper GI bleed onset Friday, 03/05/2024 and he experienced multiple episodes of dark blackish diarrhea and associated weakness and diaphoresis. Hemoglobin had dropped from his baseline of 14.4-10.9 on 03/06/2024, then on repeat yesterday down to 8.9.  He has not had any further bowel movements over the past 36 hours, no associated abdominal pain, no current nausea, no hematemesis, no dysphagia or odynophagia.  He had been taking high-dose ibuprofen for 2 to 3 days last week for back pain up to 8/day in addition to his baby aspirin   Suspect aspirin /NSAID induced peptic ulcer disease.  #2 previous history of GI bleed 2018 secondary to esophageal ulceration this occurred in the setting of cardiac catheterization/anticoagulation with acute bleed on the day of cardiac cath  #3 new AV block concerning for Mobitz 2-heart rate currently 50 #4 coronary artery disease status post prior CABG/MI 2011, then cardiac cath with stent placement 2018-on baby aspirin  outpatient #5 hypertension #6.  Hyperlipidemia  PLAN: Full liquid diet today IV PPI twice daily Hold aspirin  Hemoglobin now then every 8 hours and transfuse as indicated for hemoglobin less than 7.5 Needs cardiac consultation. Patient will need EGD during this admission, but not until cleared by cardiology as increased risk for complications with sedation with AV block/bradycardia new.  GI will follow with you.   Dominic Mahaney PA-C 03/09/2024, 11:10 AM

## 2024-03-09 NOTE — ED Notes (Addendum)
 EDP is aware of pt. Cardiac rhythm and HR

## 2024-03-10 ENCOUNTER — Ambulatory Visit: Admitting: Internal Medicine

## 2024-03-10 ENCOUNTER — Observation Stay (HOSPITAL_COMMUNITY): Admitting: Anesthesiology

## 2024-03-10 ENCOUNTER — Encounter (HOSPITAL_COMMUNITY): Payer: Self-pay | Admitting: Internal Medicine

## 2024-03-10 ENCOUNTER — Encounter (HOSPITAL_COMMUNITY)
Admission: EM | Disposition: A | Discharge status: Home / Self Care | Payer: Self-pay | Source: Home / Self Care | Attending: Family Medicine

## 2024-03-10 DIAGNOSIS — D62 Acute posthemorrhagic anemia: Secondary | ICD-10-CM | POA: Diagnosis not present

## 2024-03-10 DIAGNOSIS — Z85828 Personal history of other malignant neoplasm of skin: Secondary | ICD-10-CM | POA: Diagnosis not present

## 2024-03-10 DIAGNOSIS — Z8774 Personal history of (corrected) congenital malformations of heart and circulatory system: Secondary | ICD-10-CM | POA: Diagnosis not present

## 2024-03-10 DIAGNOSIS — K2901 Acute gastritis with bleeding: Secondary | ICD-10-CM | POA: Diagnosis not present

## 2024-03-10 DIAGNOSIS — Z9842 Cataract extraction status, left eye: Secondary | ICD-10-CM | POA: Diagnosis not present

## 2024-03-10 DIAGNOSIS — E785 Hyperlipidemia, unspecified: Secondary | ICD-10-CM

## 2024-03-10 DIAGNOSIS — Z7982 Long term (current) use of aspirin: Secondary | ICD-10-CM | POA: Diagnosis not present

## 2024-03-10 DIAGNOSIS — K254 Chronic or unspecified gastric ulcer with hemorrhage: Secondary | ICD-10-CM | POA: Diagnosis not present

## 2024-03-10 DIAGNOSIS — K2900 Acute gastritis without bleeding: Secondary | ICD-10-CM | POA: Diagnosis not present

## 2024-03-10 DIAGNOSIS — D6832 Hemorrhagic disorder due to extrinsic circulating anticoagulants: Secondary | ICD-10-CM | POA: Diagnosis not present

## 2024-03-10 DIAGNOSIS — R001 Bradycardia, unspecified: Secondary | ICD-10-CM

## 2024-03-10 DIAGNOSIS — K921 Melena: Secondary | ICD-10-CM | POA: Diagnosis present

## 2024-03-10 DIAGNOSIS — I252 Old myocardial infarction: Secondary | ICD-10-CM | POA: Diagnosis not present

## 2024-03-10 DIAGNOSIS — Z951 Presence of aortocoronary bypass graft: Secondary | ICD-10-CM | POA: Diagnosis not present

## 2024-03-10 DIAGNOSIS — I441 Atrioventricular block, second degree: Secondary | ICD-10-CM | POA: Diagnosis not present

## 2024-03-10 DIAGNOSIS — I2510 Atherosclerotic heart disease of native coronary artery without angina pectoris: Secondary | ICD-10-CM | POA: Diagnosis not present

## 2024-03-10 DIAGNOSIS — I959 Hypotension, unspecified: Secondary | ICD-10-CM | POA: Diagnosis not present

## 2024-03-10 DIAGNOSIS — K269 Duodenal ulcer, unspecified as acute or chronic, without hemorrhage or perforation: Secondary | ICD-10-CM

## 2024-03-10 DIAGNOSIS — Z955 Presence of coronary angioplasty implant and graft: Secondary | ICD-10-CM | POA: Diagnosis not present

## 2024-03-10 DIAGNOSIS — K922 Gastrointestinal hemorrhage, unspecified: Secondary | ICD-10-CM | POA: Diagnosis present

## 2024-03-10 DIAGNOSIS — Z8673 Personal history of transient ischemic attack (TIA), and cerebral infarction without residual deficits: Secondary | ICD-10-CM | POA: Diagnosis not present

## 2024-03-10 DIAGNOSIS — Z96641 Presence of right artificial hip joint: Secondary | ICD-10-CM | POA: Diagnosis not present

## 2024-03-10 DIAGNOSIS — T39395A Adverse effect of other nonsteroidal anti-inflammatory drugs [NSAID], initial encounter: Secondary | ICD-10-CM | POA: Diagnosis not present

## 2024-03-10 DIAGNOSIS — K259 Gastric ulcer, unspecified as acute or chronic, without hemorrhage or perforation: Secondary | ICD-10-CM | POA: Diagnosis not present

## 2024-03-10 DIAGNOSIS — I10 Essential (primary) hypertension: Secondary | ICD-10-CM

## 2024-03-10 DIAGNOSIS — T39015A Adverse effect of aspirin, initial encounter: Secondary | ICD-10-CM | POA: Diagnosis not present

## 2024-03-10 DIAGNOSIS — Z79899 Other long term (current) drug therapy: Secondary | ICD-10-CM | POA: Diagnosis not present

## 2024-03-10 DIAGNOSIS — Z9841 Cataract extraction status, right eye: Secondary | ICD-10-CM | POA: Diagnosis not present

## 2024-03-10 HISTORY — PX: ESOPHAGOGASTRODUODENOSCOPY: SHX5428

## 2024-03-10 LAB — HEMOGLOBIN AND HEMATOCRIT, BLOOD
HCT: 25 % — ABNORMAL LOW (ref 39.0–52.0)
HCT: 26.1 % — ABNORMAL LOW (ref 39.0–52.0)
HCT: 27.8 % — ABNORMAL LOW (ref 39.0–52.0)
Hemoglobin: 8.2 g/dL — ABNORMAL LOW (ref 13.0–17.0)
Hemoglobin: 8.7 g/dL — ABNORMAL LOW (ref 13.0–17.0)
Hemoglobin: 9.3 g/dL — ABNORMAL LOW (ref 13.0–17.0)

## 2024-03-10 LAB — HEMATOCRIT: HCT: 27.6 % — ABNORMAL LOW (ref 39.0–52.0)

## 2024-03-10 LAB — CBC WITH DIFFERENTIAL/PLATELET
Abs Immature Granulocytes: 0.02 10*3/uL (ref 0.00–0.07)
Basophils Absolute: 0 10*3/uL (ref 0.0–0.1)
Basophils Relative: 1 %
Eosinophils Absolute: 0.2 10*3/uL (ref 0.0–0.5)
Eosinophils Relative: 5 %
HCT: 26.4 % — ABNORMAL LOW (ref 39.0–52.0)
Hemoglobin: 8.7 g/dL — ABNORMAL LOW (ref 13.0–17.0)
Immature Granulocytes: 0 %
Lymphocytes Relative: 24 %
Lymphs Abs: 1.1 10*3/uL (ref 0.7–4.0)
MCH: 33.6 pg (ref 26.0–34.0)
MCHC: 33 g/dL (ref 30.0–36.0)
MCV: 101.9 fL — ABNORMAL HIGH (ref 80.0–100.0)
Monocytes Absolute: 0.3 10*3/uL (ref 0.1–1.0)
Monocytes Relative: 6 %
Neutro Abs: 2.9 10*3/uL (ref 1.7–7.7)
Neutrophils Relative %: 64 %
Platelets: 140 10*3/uL — ABNORMAL LOW (ref 150–400)
RBC: 2.59 MIL/uL — ABNORMAL LOW (ref 4.22–5.81)
RDW: 13.5 % (ref 11.5–15.5)
WBC: 4.6 10*3/uL (ref 4.0–10.5)
nRBC: 0 % (ref 0.0–0.2)

## 2024-03-10 LAB — HEMOGLOBIN: Hemoglobin: 9.2 g/dL — ABNORMAL LOW (ref 13.0–17.0)

## 2024-03-10 SURGERY — EGD (ESOPHAGOGASTRODUODENOSCOPY)
Anesthesia: Monitor Anesthesia Care

## 2024-03-10 MED ORDER — PROPOFOL 500 MG/50ML IV EMUL
INTRAVENOUS | Status: DC | PRN
  Administered 2024-03-10: 100 ug/kg/min via INTRAVENOUS

## 2024-03-10 MED ORDER — LIDOCAINE 2% (20 MG/ML) 5 ML SYRINGE
INTRAMUSCULAR | Status: DC | PRN
  Administered 2024-03-10: 100 mg via INTRAVENOUS

## 2024-03-10 MED ORDER — LACTATED RINGERS IV SOLN: INTRAVENOUS | Status: DC | PRN

## 2024-03-10 MED ORDER — PROPOFOL 10 MG/ML IV BOLUS
INTRAVENOUS | Status: DC | PRN
  Administered 2024-03-10: 50 mg via INTRAVENOUS
  Administered 2024-03-10: 10 mg via INTRAVENOUS
  Administered 2024-03-10: 20 mg via INTRAVENOUS
  Administered 2024-03-10: 30 mg via INTRAVENOUS

## 2024-03-10 NOTE — TOC CM/SW Note (Signed)
 Transition of Care [facility]) - Inpatient Brief Assessment   Patient Details  Name: Tyler Ortega MRN: [id number] Date of Birth: 03-29-1941  [facility] CM/SW Contact:    Arron Big, LCSWA Phone Number: 03/10/2024, 10:20 AM   Clinical Narrative: Patient off the floor. CSW called spouse and phone went to voicemail.   Per chart review, patient is from home with his wife. Patient has a PCP and uses [facility] in [place]. Unable to assess for DME at this time. Patient has no hx of HH or SNF in [place].   TOC will continue to follow as needed.    Transition of Care Asessment: Insurance and Status: Insurance coverage has been reviewed Patient has primary care physician: Yes Home environment has been reviewed: From home w/ spouse Prior level of function:: Independent Prior/Current Home Services: No current home services Social Drivers of Health Review: SDOH reviewed no interventions necessary Readmission risk has been reviewed: No Transition of care needs: no transition of care needs at this time

## 2024-03-10 NOTE — Plan of Care (Signed)

## 2024-03-10 NOTE — Progress Notes (Signed)
 Patient's heart rate dropped into the 30's when he was asleep. Woke patient up  and his heart rate increased to the 50's. Patient asymptomatic. Notified MD, and cardiology.

## 2024-03-10 NOTE — Anesthesia Preprocedure Evaluation (Signed)
 Anesthesia Evaluation  Patient identified by MRN, date of birth, ID band Patient awake    Reviewed: Allergy & Precautions, NPO status , Patient's Chart, lab work & pertinent test results  History of Anesthesia Complications Negative for: history of anesthetic complications  Airway Mallampati: III  TM Distance: >3 FB Neck ROM: Limited    Dental  (+) Teeth Intact, Dental Advisory Given   Pulmonary neg shortness of breath, neg sleep apnea, neg COPD, neg recent URI   breath sounds clear to auscultation       Cardiovascular hypertension, + CAD and + CABG  + dysrhythmias  Rhythm:Irregular Rate:Bradycardia   1. Left ventricular ejection fraction, by estimation, is 60 to 65%. The  left ventricle has normal function. The left ventricle has no regional  wall motion abnormalities.   2. Right ventricular systolic function is normal. The right ventricular  size is normal.   3. Left atrial size was mildly dilated.   4. Right atrial size was mildly dilated.   5. The mitral valve is normal in structure and function. Trivial mitral  valve regurgitation. No evidence of mitral stenosis.   6. The aortic valve is tricuspid. Aortic valve regurgitation is not  visualized. Mild aortic valve sclerosis is present, with no evidence of  aortic valve stenosis.   7. The inferior vena cava is normal in size with greater than 50%  respiratory variability, suggesting right atrial pressure of 3 mmHg.     Neuro/Psych TIA negative psych ROS   GI/Hepatic Neg liver ROS,,,? GI bleed   Endo/Other  negative endocrine ROS    Renal/GU negative Renal ROS     Musculoskeletal  (+) Arthritis ,    Abdominal   Peds  Hematology  (+) Blood dyscrasia, anemia Lab Results      Component                Value               Date                      WBC                      7.3                 03/08/2024                HGB                      8.2 (L)              03/10/2024                HCT                      25.0 (L)            03/10/2024                MCV                      100.4 (H)           03/08/2024                PLT                      135 (L)  03/08/2024              Anesthesia Other Findings   Reproductive/Obstetrics                              Anesthesia Physical Anesthesia Plan  ASA: 3  Anesthesia Plan: MAC   Post-op Pain Management: Minimal or no pain anticipated   Induction: Intravenous  PONV Risk Score and Plan: 1 and Propofol  infusion and Treatment may vary due to age or medical condition  Airway Management Planned: Nasal Cannula, Natural Airway and Simple Face Mask  Additional Equipment: None  Intra-op Plan:   Post-operative Plan:   Informed Consent: I have reviewed the patients History and Physical, chart, labs and discussed the procedure including the risks, benefits and alternatives for the proposed anesthesia with the patient or authorized representative who has indicated his/her understanding and acceptance.     Dental advisory given  Plan Discussed with: CRNA  Anesthesia Plan Comments: (Pacer pads in place)         Anesthesia Quick Evaluation

## 2024-03-10 NOTE — Transfer of Care (Signed)
 Immediate Anesthesia Transfer of Care Note  Patient: CARTEZ MOGLE  Procedure(s) Performed: EGD (ESOPHAGOGASTRODUODENOSCOPY)  Patient Location: PACU  Anesthesia Type:MAC  Level of Consciousness: drowsy, patient cooperative, and responds to stimulation  Airway & Oxygen Therapy: Patient Spontanous Breathing  Post-op Assessment: Report given to RN and Post -op Vital signs reviewed and stable  Post vital signs: Reviewed and stable  Last Vitals:  Vitals Value Taken Time  BP 109/56 03/10/24 1033  Temp 98   Pulse 71 03/10/24 1033  Resp 22 03/10/24 1033  SpO2 90 % 03/10/24 1033      Complications: No notable events documented.

## 2024-03-10 NOTE — Op Note (Signed)
 [facility] Patient Name: Tyler Ortega Procedure Date : 03/10/2024 MRN: [id number] Attending MD: Eugenia Hess , MD, [id number] Date of Birth: Dec 02, 1940 CSN: [id number] Age: 83 Admit Type: Inpatient Procedure:                Upper GI endoscopy Indications:              Acute post hemorrhagic anemia, Melena, history of                            recent NSAID use Providers:                Eugenia Hess, MD, Bradley Caffey, Judith Novak,                            Technician Referring MD:              Medicines:                Monitored Anesthesia Care Complications:            No immediate complications. Estimated blood loss:                            None. Estimated Blood Loss:     Estimated blood loss was minimal. Procedure:                Pre-Anesthesia Assessment:                           - Prior to the procedure, a History and Physical                            was performed, and patient medications and                            allergies were reviewed. The patient's tolerance of                            previous anesthesia was also reviewed. The risks                            and benefits of the procedure and the sedation                            options and risks were discussed with the patient.                            All questions were answered, and informed consent                            was obtained. Prior Anticoagulants: The patient has                            taken no anticoagulant or antiplatelet agents. ASA                            Grade Assessment:  III - A patient with severe                            systemic disease. After reviewing the risks and                            benefits, the patient was deemed in satisfactory                            condition to undergo the procedure.                           After obtaining informed consent, the endoscope was                            passed under direct vision. Throughout the                             procedure, the patient's blood pressure, pulse, and                            oxygen saturations were monitored continuously. The                            [id number]-H190 ([id number] Olympus endoscope was introduced                            through the mouth, and advanced to the second part                            of duodenum. The upper GI endoscopy was                            accomplished without difficulty. The patient                            tolerated the procedure well. Scope In: Scope Out: Findings:      The examined esophagus was normal.      One non-bleeding cratered gastric ulcer was found in the gastric antrum.       The lesion was 5 mm in largest dimension. There was no active bleeding       from the lesion but a red spot was noted at the ulcer base. 3 mL of       Purastat was applied over the ulcer base.      Scattered mild inflammation characterized by erythema was found in the       prepyloric region of the stomach.      The cardia (on retroflexion) and gastric fundus (on retroflexion) were       normal.      Biopsies were taken with a cold forceps in the gastric body and in the       gastric antrum for Helicobacter pylori testing.      The duodenal bulb and second portion of the duodenum were normal with       the exception of a single nonbleeding erosion  seen at the duodenal sweep. Impression:               - Normal esophagus.                           - Non-bleeding gastric ulcer. Likely NSAID induced.                            Treated with Purastat.                           - Gastritis.                           - Normal cardia and gastric fundus.                           - Normal duodenal bulb and second portion of the                            duodenum with the exception of a single nonbleeding                            erosion seen at the duodenal sweep.                           - Biopsies were taken with a cold forceps for                             Helicobacter pylori testing. Recommendation:           - Return patient to hospital ward for ongoing care.                           - Follow-up biopsies from today's EGD to rule out                            H. pylori.                           - Continue to monitor hemodynamics and serial H&H                           - Continue IV PPI twice daily. If no further                            bleeding tomorrow can consider transitioning to                            oral PPI twice daily.                           - NSAID avoidance indefinitely                           - Continue holding aspirin  today and can reevaluate  resumption in the future.                           - May start clear liquid diet and consider                            advancing later today if stable.                           - Patient will need repeat EGD in 8 to 12 weeks as                            an outpatient for confirmation of ulcer healing Procedure Code(s):        --- Professional ---                           [phone], Esophagogastroduodenoscopy, flexible,                            transoral; with biopsy, single or multiple Diagnosis Code(s):        --- Professional ---                           K25.9, Gastric ulcer, unspecified as acute or                            chronic, without hemorrhage or perforation                           K29.70, Gastritis, unspecified, without bleeding                           D62, Acute posthemorrhagic anemia                           K92.1, Melena (includes Hematochezia) CPT copyright 2022 American Medical Association. All rights reserved. The codes documented in this report are preliminary and upon coder review may  be revised to meet current compliance requirements. Eugenia Hess, MD 03/10/2024 10:40:39 AM This report has been signed electronically. Number of Addenda: 0

## 2024-03-10 NOTE — Progress Notes (Signed)
 PROGRESS NOTE    Tyler Ortega  ZOX:[id number] DOB: 1941/05/21 DOA: 03/08/2024 PCP: Ezell Hollow, MD   Brief Narrative:  Tyler Ortega is a 83 y.o. 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  Patient states that he was very diaphoretic and he went to med center [facility] few days ago where they checked his blood and said his blood dropped a little bit but he was asymptomatic and decided to come home and have a recheck and as his recheck was further lower for the hemoglobin count patient was admitted to [facility] for possible GI bleed.  Assessment & Plan:   Principal Problem:   Acute upper GI bleed Active Problems:   Symptomatic anemia   Melena   Mobitz type 2 second degree AV block   Mobitz type 1 second degree AV block   Gastric ulcer with hemorrhage  Hematemesis/melena/acute blood loss anemia secondary to gastric ulcer and gastritis due to NSAID use, POA: Patient tells me that recently he used about 1000 mg of ibuprofen 2-3 times a day for only 3 days.  Started having hematemesis and melena.  At baseline hemoglobin remains around 14-15 but hemoglobin upon admission was 8.2 which is 8.7 now.  Has not required any blood transfusion.  Underwent EGD, was found to have nonbleeding gastric ulcer which was treated as well as gastritis.  Biopsies were taken.  Patient is doing well post EGD.  Tolerating clear liquid diet, will advance to full liquid diet later today and soft diet early in the morning.  Continue PPI twice daily.  Will resume aspirin  tomorrow per GI recommendations.  Monitor hemoglobin now and then in the morning again and transfuse if less than 7.  History of CAD and now bradycardia: Cardiology was consulted for this,Per them, he has  first-degree AV block and intermittent second-degree type I and second degree type II AV block but no pauses and patient is very active at baseline and currently asymptomatic.  They have recommended Zio patch and have discussed with the patient.  Monitor for now.  Patient is not on any rate control medications.  Prior PFO closure: by Dr. Arlester Ladd, after concern for embolic stroke in 2021 based on MRI findings and transient episode of altered awareness   CAD with prior CABG and PCI/hyperlipidemia: no symptoms.  Zoom aspirin  tomorrow per GI recommendations.  Continue atorvastatin   DVT prophylaxis: SCDs Start: 03/09/24 1320   Code Status: Full Code  Family Communication: Wife and daughter present at bedside.  Plan of care discussed with patient in length and he/she verbalized understanding and agreed with it.  Status is: Observation The patient will require care spanning > 2 midnights and should be moved to inpatient because: Needs monitoring overnight due to bradycardia and anemia.   Estimated body mass index is 25.56 kg/m as calculated from the following:   Height as of this encounter: 5\' 8"  (1.727 m).   Weight as of this encounter: 76.2 kg.    Nutritional Assessment: Body mass index is 25.56 kg/m.[name] Seen by dietician.  I agree with the assessment and plan as outlined below: Nutrition Status:        . Skin Assessment: I have examined the patient's skin and I agree with the wound assessment as  performed by the wound care RN as outlined below:    Consultants:  GI and cardiology  Procedures:  As above  Antimicrobials:  Anti-infectives (From admission, onward)    None         Subjective: Seen and examined after EGD, doing well, eating lunch.  No complaints.  Great sense of humor.  Objective: Vitals:   03/10/24 0929 03/10/24 1034 03/10/24 1040 03/10/24 1050  BP: (!) 140/71 (!) 109/56 123/66 118/86  Pulse: (!) 53 71 (!) 53 (!) 49  Resp: 17 (!) 22 18 17   Temp:  (!) 97  F (36.1 C)    TempSrc:  Temporal    SpO2: 100% 93% 97% 94%  Weight:      Height:        Intake/Output Summary (Last 24 hours) at 03/10/2024 1240 Last data filed at 03/10/2024 1200 Gross per 24 hour  Intake 875.83 ml  Output 1100 ml  Net -224.17 ml   Filed Weights   03/08/24 1901 03/10/24 0418  Weight: 75.8 kg 76.2 kg    Examination:  General exam: Appears calm and comfortable  Respiratory system: Clear to auscultation. Respiratory effort normal. Cardiovascular system: S1 & S2 heard, RRR. No JVD, murmurs, rubs, gallops or clicks. No pedal edema. Gastrointestinal system: Abdomen is nondistended, soft and nontender. No organomegaly or masses felt. Normal bowel sounds heard. Central nervous system: Alert and oriented. No focal neurological deficits. Extremities: Symmetric 5 x 5 power. Skin: No rashes, lesions or ulcers Psychiatry: Judgement and insight appear normal. Mood & affect appropriate.    Data Reviewed: I have personally reviewed following labs and imaging studies  CBC: Recent Labs  Lab 03/06/24 1228 03/08/24 1904 03/09/24 1158 03/09/24 1334 03/09/24 2009 03/10/24 0241 03/10/24 1140  WBC 7.3 7.3  --   --   --   --   --   NEUTROABS 5.5 4.8  --   --   --   --   --   HGB 10.9* 8.9* 8.6* 9.0* 9.6* 8.2* 8.7*  HCT 32.5* 26.4* 26.6* 27.2* 28.2* 25.0* 26.1*  MCV 101.2* 100.4*  --   --   --   --   --   PLT 142* 135*  --   --   --   --   --    Basic Metabolic Panel: Recent Labs  Lab 03/06/24 1228 03/08/24 1904 03/09/24 2009  NA 141 143  --   K 4.2 3.6  --   CL 110 111  --   CO2 22 23  --   GLUCOSE 81 94  --   BUN 59* 30*  --   CREATININE 1.04 1.15  --   CALCIUM  8.9 8.5*  --   MG  --   --  2.1   GFR: Estimated Creatinine Clearance: 47.9 mL/min (by C-G formula based on SCr of 1.15 mg/dL). Liver Function Tests: Recent Labs  Lab 03/06/24 1228 03/08/24 1904  AST 24 31  ALT 15 17  ALKPHOS 60 57  BILITOT 0.2 0.3  PROT 5.9* 5.7*  ALBUMIN 3.8 3.8    Recent Labs  Lab 03/06/24 1228  LIPASE 18   No results for input(s): "AMMONIA" in the last 168 hours. Coagulation Profile: No results for input(s): "INR", "PROTIME" in the last 168 hours. Cardiac Enzymes: No results for input(s): "CKTOTAL", "CKMB", "CKMBINDEX", "TROPONINI" in the last 168 hours. BNP (last 3 results) No results for input(s): "PROBNP" in the last 8760 hours. HbA1C: No results for input(s): "HGBA1C" in the  last 72 hours. CBG: No results for input(s): "GLUCAP" in the last 168 hours. Lipid Profile: No results for input(s): "CHOL", "HDL", "LDLCALC", "TRIG", "CHOLHDL", "LDLDIRECT" in the last 72 hours. Thyroid  Function Tests: Recent Labs    03/09/24 1334  TSH 1.323  FREET4 0.89   Anemia Panel: No results for input(s): "VITAMINB12", "FOLATE", "FERRITIN", "TIBC", "IRON", "RETICCTPCT" in the last 72 hours. Sepsis Labs: No results for input(s): "PROCALCITON", "LATICACIDVEN" in the last 168 hours.  No results found for this or any previous visit (from the past 240 hours).   Radiology Studies: No results found.  Scheduled Meds:  pantoprazole (PROTONIX) IV  40 mg Intravenous Q12H   Continuous Infusions:  lactated ringers  50 mL/hr at 03/10/24 0631     LOS: 0 days   Modena Andes, MD [facility]  03/10/2024, 12:40 PM   *Please note that this is a verbal dictation therefore any spelling or grammatical errors are due to the "Dragon Medical One" system interpretation.  Please page via Amion and do not message via secure chat for urgent patient care matters. Secure chat can be used for non urgent patient care matters.  How to contact the [facility] Attending or Consulting provider 7A - 7P or covering provider during after hours 7P -7A, for this patient?  Check the care team in [facility] and look for a) attending/consulting [facility] provider listed and b) the [facility] team listed. Page or secure chat 7A-7P. Log into [email or web address] and use [place]'s universal password to access.  If you do not have the password, please contact the hospital operator. Locate the [facility] provider you are looking for under [facility] and page to a number that you can be directly reached. If you still have difficulty reaching the provider, please page the [facility] (Director on Call) for the Hospitalists listed on amion for assistance.

## 2024-03-10 NOTE — Progress Notes (Signed)
   Rounding Note    Patient Name: Tyler Ortega Date of Encounter: 03/10/2024  [facility] Cardiologist: None   Subjective   No acute events overnight. Planned for EGD today. Telemetry reviewed, see below. Discussed plan for monitor and follow up.   Inpatient Medications    Scheduled Meds:  [MAR Hold] pantoprazole (PROTONIX) IV  40 mg Intravenous Q12H   Continuous Infusions:  lactated ringers  50 mL/hr at 03/10/24 0631   PRN Meds: [MAR Hold] acetaminophen  **OR** [MAR Hold] acetaminophen , [MAR Hold] ondansetron  **OR** [MAR Hold] ondansetron  (ZOFRAN ) IV   Vital Signs    Vitals:   03/09/24 2024 03/09/24 2338 03/10/24 0418 03/10/24 0741  BP: 134/63 (!) 121/53 (!) 107/58 138/64  Pulse: (!) 59 61 61 66  Resp: 20 20 20    Temp: 97.6 F (36.4 C) 97.6 F (36.4 C) (!) 97.5 F (36.4 C) 97.8 F (36.6 C)  TempSrc: Oral Oral Oral Oral  SpO2: 100% 98% 98% 97%  Weight:   76.2 kg   Height:        Intake/Output Summary (Last 24 hours) at 03/10/2024 0924 Last data filed at 03/10/2024 0745 Gross per 24 hour  Intake 575.83 ml  Output 700 ml  Net -124.17 ml      03/10/2024    4:18 AM 03/08/2024    7:01 PM 03/06/2024   11:49 AM  Last 3 Weights  Weight (lbs) 168 lb 1.6 oz 167 lb 164 lb  Weight (kg) 76.25 kg 75.751 kg 74.39 kg      Telemetry    Predominantly sinus bradycardia/sinus rhythm. No significant pauses. Mostly 1st degree AV block, occasional Wenkebach (second degree type I) and second degree type II - Personally Reviewed  Physical Exam   GEN: No acute distress.   Neck: No JVD Cardiac: largely regular rhythm, no murmurs, rubs, or gallops.  Respiratory: Clear to auscultation bilaterally. GI: Soft, nontender, non-distended  MS: No edema; No deformity. Neuro:  Nonfocal  Psych: Normal affect   New pertinent results (labs, ECG, imaging, cardiac studies)    Labs show downtrend to Hgb today  Assessment & Plan    Sinus bradycardia 1st degree AV  block Intermittent second degree type I and second degree type II AV block -see comments 6/3; In summary, very active at baseline, completely asymptomatic and nonlimiting. No significant pauses on telemetry -recommended atropine and pacing pads/battery pack at bedside during EGD in case vagal stimulation worsens AV block -will follow up post EGD, review tracings. Discussed outpatient Zio monitor at discharge.  Prior PFO closure: by Dr. Arlester Ladd, after concern for embolic stroke in 2021 based on MRI findings and transient episode of altered awareness  CAD with prior CABG and PCI: no symptoms. Continue aspirin  (if cleared by GI), atorvastatin     Signed, Sheryle Donning, MD  03/10/2024, 9:24 AM

## 2024-03-10 NOTE — H&P (Signed)
 [facility] History and Physical   Primary Care Physician:  Ezell Hollow, MD   Reason for Procedure:  Anemia, melena  Plan:    Upper endoscopy with possible small bowel enteroscopy     HPI: Tyler Ortega is a 83 y.o. male undergoing upper endoscopy with possible small bowel enteroscopy for investigation of anemia and melena.  Patient was admitted to the hospital after having several days of dark stool and hemoglobin 8.9.  Denies nausea, vomiting or abdominal pain.  Reports a previous upper GI bleed in the past related to gastritis and GE junction ulceration.  He takes aspirin  but is on no other anticoagulants.  History of type II AV block and has been evaluated by cardiology.   Past Medical History:  Diagnosis Date   Arthritis    osteoarthritis -hips, back pain tx with Prednisone -last dose 09-04-15.   CAD (coronary artery disease)    MI 2011, seen @ [facility], sees Tax adviser (once a year)   Hyperlipidemia    Hypertension    Skin cancer    R calf and the ear (?side)   Transfusion history    2nd Hip surgery    Past Surgical History:  Procedure Laterality Date   BUBBLE STUDY  11/18/2019   Procedure: BUBBLE STUDY;  Surgeon: Hugh Madura, MD;  Location: [facility] ENDOSCOPY;  Service: Cardiovascular;;   COLONOSCOPY  05/29/11   Normal   CORONARY ARTERY BYPASS GRAFT     05-2010-x4 vessel [facility]   EYE SURGERY Bilateral 2019   cataract   HIP SURGERY Left    x3 - L hip (initial hip replacement bleed)   INGUINAL HERNIA REPAIR Left    PATENT FORAMEN OVALE(PFO) CLOSURE N/A 12/03/2019   Procedure: PATENT FORAMEN OVALE (PFO) CLOSURE;  Surgeon: Arnoldo Lapping, MD;  Location: [facility] INVASIVE CV LAB;  Service: Cardiovascular;  Laterality: N/A;   TEE WITHOUT CARDIOVERSION N/A 11/18/2019   Procedure: TRANSESOPHAGEAL ECHOCARDIOGRAM (TEE);  Surgeon: Hugh Madura, MD;  Location: [facility] ENDOSCOPY;  Service: Cardiovascular;  Laterality: N/A;   TOTAL HIP ARTHROPLASTY Right 09/12/2015    Procedure: RIGHT TOTAL HIP ARTHROPLASTY ANTERIOR APPROACH;  Surgeon: Claiborne Crew, MD;  Location: [facility] ORS;  Service: Orthopedics;  Laterality: Right;   VASECTOMY      Prior to Admission medications   Medication Sig Start Date End Date Taking? Authorizing Provider  amoxicillin  (AMOXIL ) 500 MG capsule Take 2,000 mg by mouth daily as needed (dental work). Dental visits 11/28/20  Yes [provider]  aspirin  EC 81 MG tablet Take 81 mg by mouth daily. Swallow whole.   Yes [provider]  atorvastatin  (LIPITOR) 80 MG tablet Take 1 tablet by mouth once daily 12/02/23  Yes Paz, Jose E, MD  Cyanocobalamin  (VITAMIN B-12 PO) Take 1 tablet by mouth daily.   Yes [provider]  Multiple Vitamins-Minerals (MULTIVITAMIN MEN 50+) TABS Take 1 tablet by mouth daily.   Yes [provider]  nitroGLYCERIN  (NITROSTAT ) 0.4 MG SL tablet Place 1 tablet (0.4 mg total) under the tongue every 5 (five) minutes x 3 doses as needed for chest pain. 03/17/23  Yes Arnoldo Lapping, MD    Current Facility-Administered Medications  Medication Dose Route Frequency Provider Last Rate Last Admin   [MAR Hold] acetaminophen  (TYLENOL ) tablet 650 mg  650 mg Oral Q6H PRN Patel, Ekta V, MD       Or   Evette Hoes Hold] acetaminophen  (TYLENOL ) suppository 650 mg  650 mg Rectal Q6H PRN Patel, Ekta V, MD  lactated ringers  infusion   Intravenous Continuous Lavanda Porter, MD 50 mL/hr at 03/10/24 0631 Infusion Verify at 03/10/24 0631   [MAR Hold] ondansetron  (ZOFRAN ) tablet 4 mg  4 mg Oral Q6H PRN Patel, Ekta V, MD       Or   Evette Hoes Hold] ondansetron  (ZOFRAN ) injection 4 mg  4 mg Intravenous Q6H PRN Patel, Ekta V, MD       [MAR Hold] pantoprazole (PROTONIX) injection 40 mg  40 mg Intravenous Q12H Esterwood, Amy S, PA-C   40 mg at 03/10/24 0845   Facility-Administered Medications Ordered in Other Encounters  Medication Dose Route Frequency Provider Last Rate Last Admin   sodium chloride  flush (NS) 0.9 %  injection 10 mL  10 mL Intravenous PRN Ardia Kraft, PA-C   10 mL at 11/30/20 1324    Allergies as of 03/08/2024   (No Known Allergies)    Family History  Problem Relation Age of Onset   Other Mother 43       "old age"   Other Father 39       tumor on neck   Coronary artery disease Neg Hx    Diabetes Neg Hx    Colon cancer Neg Hx    Prostate cancer Neg Hx    Seizures Neg Hx    Multiple sclerosis Neg Hx    Migraines Neg Hx    Dementia Neg Hx     Social History   Socioeconomic History   Marital status: Married    Spouse name: Stana Ear   Number of children: 3   Years of education: Some college   Highest education level: Not on file  Occupational History   Occupation: Psychologist, sport and exercise, Retired    Associate Professor: OTHER  Tobacco Use   Smoking status: Never   Smokeless tobacco: Never  Vaping Use   Vaping status: Never Used  Substance and Sexual Activity   Alcohol use: Yes    Comment: rarely 1 glass of wine every 6 mths., last 6 months ago   Drug use: No   Sexual activity: Not on file  Other Topics Concern   Not on file  Social History Narrative   3 children from previous marriage, lives w/ wife,   Still working 3 days per week @ [employer]      Caffeine use: 1-2 cups coffee per day   Soda: diet pepsi a few times a week   Right handed   Social Drivers of Corporate investment banker Strain: Low Risk  (07/22/2023)   Overall Financial Resource Strain (CARDIA)    Difficulty of Paying Living Expenses: Not hard at all  Food Insecurity: No Food Insecurity (03/10/2024)   Hunger Vital Sign    Worried About Running Out of Food in the Last Year: Never true    Ran Out of Food in the Last Year: Never true  Transportation Needs: No Transportation Needs (07/22/2023)   PRAPARE - Administrator, Civil Service (Medical): No    Lack of Transportation (Non-Medical): No  Physical Activity: Sufficiently Active (07/22/2023)   Exercise Vital Sign    Days of Exercise per  Week: 6 days    Minutes of Exercise per Session: 40 min  Stress: No Stress Concern Present (07/22/2023)   [employer] of Occupational Health - Occupational Stress Questionnaire    Feeling of Stress : Not at all  Social Connections: Socially Integrated (07/22/2023)   Social Connection and Isolation Panel [NHANES]    Frequency of  Communication with Friends and Family: More than three times a week    Frequency of Social Gatherings with Friends and Family: More than three times a week    Attends Religious Services: More than 4 times per year    Active Member of [employer] or Organizations: Yes    Attends Engineer, structural: More than 4 times per year    Marital Status: Married  Catering manager Violence: Not At Risk (07/23/2023)   Humiliation, Afraid, Rape, and Kick questionnaire    Fear of Current or Ex-Partner: No    Emotionally Abused: No    Physically Abused: No    Sexually Abused: No    Review of Systems:  All other review of systems negative except as mentioned in the HPI.  Physical Exam: Vital signs BP (!) 140/71   Pulse (!) 53   Temp 97.8 F (36.6 C) (Oral)   Resp 17   Ht 5\' 8"  (1.727 m)   Wt 76.2 kg   SpO2 100%   BMI 25.56 kg/m   General:   Alert,  Well-developed, well-nourished, pleasant and cooperative in NAD Lungs:  Clear throughout to auscultation.   Heart:  Regular rate and rhythm; no murmurs, clicks, rubs,  or gallops. Abdomen:  Soft, nontender and nondistended. Normal bowel sounds.   Neuro/Psych:  Normal mood and affect. A and O x 3  Eugenia Hess, MD [facility]

## 2024-03-11 ENCOUNTER — Encounter (HOSPITAL_COMMUNITY): Payer: Self-pay | Admitting: Pediatrics

## 2024-03-11 ENCOUNTER — Inpatient Hospital Stay

## 2024-03-11 ENCOUNTER — Other Ambulatory Visit (HOSPITAL_COMMUNITY): Payer: Self-pay

## 2024-03-11 ENCOUNTER — Other Ambulatory Visit: Payer: Self-pay

## 2024-03-11 DIAGNOSIS — I441 Atrioventricular block, second degree: Secondary | ICD-10-CM

## 2024-03-11 DIAGNOSIS — K254 Chronic or unspecified gastric ulcer with hemorrhage: Secondary | ICD-10-CM | POA: Diagnosis not present

## 2024-03-11 DIAGNOSIS — K922 Gastrointestinal hemorrhage, unspecified: Secondary | ICD-10-CM | POA: Diagnosis not present

## 2024-03-11 DIAGNOSIS — D62 Acute posthemorrhagic anemia: Secondary | ICD-10-CM | POA: Diagnosis not present

## 2024-03-11 LAB — BASIC METABOLIC PANEL WITH GFR
Anion gap: 5 (ref 5–15)
BUN: 16 mg/dL (ref 8–23)
CO2: 26 mmol/L (ref 22–32)
Calcium: 8.7 mg/dL — ABNORMAL LOW (ref 8.9–10.3)
Chloride: 107 mmol/L (ref 98–111)
Creatinine, Ser: 1.08 mg/dL (ref 0.61–1.24)
GFR, Estimated: >60 mL/min (ref >60)
Glucose, Bld: 100 mg/dL — ABNORMAL HIGH (ref 70–99)
Potassium: 3.8 mmol/L (ref 3.5–5.1)
Sodium: 138 mmol/L (ref 135–145)

## 2024-03-11 LAB — CBC WITH DIFFERENTIAL/PLATELET
Abs Immature Granulocytes: 0.02 10*3/uL (ref 0.00–0.07)
Basophils Absolute: 0 10*3/uL (ref 0.0–0.1)
Basophils Relative: 1 %
Eosinophils Absolute: 0.3 10*3/uL (ref 0.0–0.5)
Eosinophils Relative: 5 %
HCT: 29.2 % — ABNORMAL LOW (ref 39.0–52.0)
Hemoglobin: 9.7 g/dL — ABNORMAL LOW (ref 13.0–17.0)
Immature Granulocytes: 0 %
Lymphocytes Relative: 19 %
Lymphs Abs: 1.1 10*3/uL (ref 0.7–4.0)
MCH: 34 pg (ref 26.0–34.0)
MCHC: 33.2 g/dL (ref 30.0–36.0)
MCV: 102.5 fL — ABNORMAL HIGH (ref 80.0–100.0)
Monocytes Absolute: 0.4 10*3/uL (ref 0.1–1.0)
Monocytes Relative: 7 %
Neutro Abs: 3.9 10*3/uL (ref 1.7–7.7)
Neutrophils Relative %: 68 %
Platelets: 184 10*3/uL (ref 150–400)
RBC: 2.85 MIL/uL — ABNORMAL LOW (ref 4.22–5.81)
RDW: 13.6 % (ref 11.5–15.5)
WBC: 5.7 10*3/uL (ref 4.0–10.5)
nRBC: 0 % (ref 0.0–0.2)

## 2024-03-11 LAB — GLUCOSE, CAPILLARY: Glucose-Capillary: 92 mg/dL (ref 70–99)

## 2024-03-11 LAB — HEMOGLOBIN AND HEMATOCRIT, BLOOD
HCT: 27 % — ABNORMAL LOW (ref 39.0–52.0)
Hemoglobin: 9 g/dL — ABNORMAL LOW (ref 13.0–17.0)

## 2024-03-11 MED ORDER — OMEPRAZOLE 40 MG PO CPDR
40.0000 mg | DELAYED_RELEASE_CAPSULE | Freq: Two times a day (BID) | ORAL | 0 refills | Status: DC
  Filled 2024-03-11: qty 120, 60d supply, fill #0

## 2024-03-11 NOTE — Progress Notes (Signed)
 Ordered 14 day zio patch for bradycardia, Dr. Renna Cary to read

## 2024-03-11 NOTE — Progress Notes (Unsigned)
 Enrolled patient for a 14 day Zio XT monitor to be mailed to patients home  Skains to read

## 2024-03-11 NOTE — Progress Notes (Signed)
   Rounding Note    Patient Name: Tyler Ortega Date of Encounter: 03/11/2024  [facility] Cardiologist: None   Subjective   No events, reviewed result of EGD. Telemetry unchanged. Discussed plans for monitor, he is amenable. Reviewed findings/plan with wife via phone.  Inpatient Medications    Scheduled Meds:  pantoprazole (PROTONIX) IV  40 mg Intravenous Q12H   Continuous Infusions:   PRN Meds: acetaminophen  **OR** acetaminophen , ondansetron  **OR** ondansetron  (ZOFRAN ) IV   Vital Signs    Vitals:   03/10/24 1921 03/11/24 0009 03/11/24 0427 03/11/24 0717  BP: 134/61 (!) 105/56 113/68 135/62  Pulse: 71 (!) 37 60 66  Resp:  18 18 18   Temp: (!) 97.5 F (36.4 C) 97.9 F (36.6 C) 97.9 F (36.6 C) (!) 97.5 F (36.4 C)  TempSrc: Oral Oral Oral Oral  SpO2: 100% 99% 98% 100%  Weight:   76.7 kg   Height:        Intake/Output Summary (Last 24 hours) at 03/11/2024 1514 Last data filed at 03/11/2024 4401 Gross per 24 hour  Intake 944.72 ml  Output 540 ml  Net 404.72 ml      03/11/2024    4:27 AM 03/10/2024    4:18 AM 03/08/2024    7:01 PM  Last 3 Weights  Weight (lbs) 169 lb 168 lb 1.6 oz 167 lb  Weight (kg) 76.658 kg 76.25 kg 75.751 kg      Telemetry    Predominantly sinus bradycardia/sinus rhythm. No significant pauses. Mostly 1st degree AV block, occasional Wenkebach (second degree type I) and second degree type II - Personally Reviewed  Physical Exam   GEN: No acute distress.   Neck: No JVD Cardiac: largely regular rhythm, no murmurs, rubs, or gallops.  Respiratory: Clear to auscultation bilaterally. GI: Soft, nontender, non-distended  MS: No edema; No deformity. Neuro:  Nonfocal  Psych: Normal affect   New pertinent results (labs, ECG, imaging, cardiac studies)      Assessment & Plan    Sinus bradycardia 1st degree AV block Intermittent second degree type I and second degree type II AV block -see comments 6/3; In summary, very active at  baseline, completely asymptomatic and nonlimiting. No significant pauses on telemetry -Discussed outpatient Zio; would like to avoid live monitor given likelihood of alerts and lack of symptoms. Will arrange to mail 14 day Zio to home.  Prior PFO closure: by Dr. Arlester Ladd, after concern for embolic stroke in 2021 based on MRI findings and transient episode of altered awareness  CAD with prior CABG and PCI: no symptoms. Continue aspirin  (cleared by GI), atorvastatin   He would like to follow up at [facility] for cardiology, will arrange for outpatient appt.    Signed, Sheryle Donning, MD  03/11/2024, 3:14 PM

## 2024-03-11 NOTE — Progress Notes (Signed)
 Patient ID: Tyler Ortega, male   DOB: 1941/07/06, 83 y.o.   MRN: [id number]    Progress Note   Subjective  Day # 3 CC; symptomatic anemia, dark stools  EGD yesterday with finding of a nonbleeding cratered gastric ulcer in the gastric antrum, 5 mm, red spot at the ulcer base treated with Purostat Biopsies pending  Last hemoglobin 9.2-pending this a.m.  Patient says he had a more normal-appearing bowel movement this morning, says he feels good, no complaints of abdominal discomfort, no nausea,   Objective   Vital signs in last 24 hours: Temp:  [97 F (36.1 C)-97.9 F (36.6 C)] 97.5 F (36.4 C) (06/05 0717) Pulse Rate:  [37-71] 66 (06/05 0717) Resp:  [17-22] 18 (06/05 0717) BP: (105-141)/(56-86) 135/62 (06/05 0717) SpO2:  [93 %-100 %] 100 % (06/05 0717) Weight:  [76.7 kg] 76.7 kg (06/05 0427) Last BM Date : 03/10/24 General:   Elderly white male in NAD, sitting up in the chair Heart:  Regular rate and rhythm; no murmurs Lungs: Respirations even and unlabored, lungs CTA bilaterally Abdomen:  Soft, nontender and nondistended. Normal bowel sounds. Extremities:  Without edema. Neurologic:  Alert and oriented,  grossly normal neurologically. Psych:  Cooperative. Normal mood and affect.  Intake/Output from previous day: 06/04 0701 - 06/05 0700 In: 1004.7 [P.O.:300; I.V.:704.7] Out: 1340 [Urine:1340] Intake/Output this shift: Total I/O In: 240 [P.O.:240] Out: -   Lab Results: Recent Labs    03/08/24 1904 03/09/24 1158 03/10/24 1250 03/10/24 1919 03/10/24 2106  WBC 7.3  --  4.6  --   --   HGB 8.9*   < > 8.7* 9.3* 9.2*  HCT 26.4*   < > 26.4* 27.8* 27.6*  PLT 135*  --  140*  --   --    < > = values in this interval not displayed.   BMET Recent Labs    03/08/24 1904  NA 143  K 3.6  CL 111  CO2 23  GLUCOSE 94  BUN 30*  CREATININE 1.15  CALCIUM  8.5*   LFT Recent Labs    03/08/24 1904  PROT 5.7*  ALBUMIN 3.8  AST 31  ALT 17  ALKPHOS 57  BILITOT 0.3    PT/INR No results for input(s): "LABPROT", "INR" in the last 72 hours.       Assessment / Plan:    #29 83 year old white male with acute onset of GI bleeding on Friday, 03/05/2024 when he experienced multiple episodes of dark blackish diarrhea associated with weakness and diaphoresis.  He presented to the hospital on 03/08/2024 with hemoglobin down to 8.9 from his baseline of 14  He had not had any active bleeding for about 24 hours prior to admission.  EGD yesterday with finding of a 5 mm Cratered gastric ulcer, nonbleeding-there was a red spot noted in the ulcer base and this was treated with Purostat  Biopsies taken and pending  Patient has not had any evidence of further active bleeding, hemoglobin pending this morning, at last count last night was stable  #2 coronary artery disease status post previous CABG and PCI-only on aspirin  at home  #3 history of previous CVA, PFO closure February 2021 #4 AV block-intermittent second-degree-cardiology following-plan is for Zio patch as an outpatient  Plan; soft diet Will need twice daily PPI/Protonix on discharge x 8 weeks, then probably once daily. Okay to resume baby aspirin  Discussed again with the patient this morning absolutely no NSAIDs, okay for Tylenol  Will need office follow-up and plan  for repeat EGD in 8 to 12 weeks to confirm healing Will follow-up biopsies  If hemoglobin stable this morning, patient should be okay for discharge later today  Principal Problem:   Acute upper GI bleed Active Problems:   Symptomatic anemia   Melena   Mobitz type 2 second degree AV block   Mobitz type 1 second degree AV block   Gastric ulcer with hemorrhage   Upper GI bleed     LOS: 1 day   Yashica Sterbenz PA-C 03/11/2024, 8:33 AM

## 2024-03-11 NOTE — Discharge Summary (Signed)
 Physician Discharge Summary  Tyler Ortega ZOX:[id number] DOB: 03/06/1941 DOA: 03/08/2024  PCP: Ezell Hollow, MD  Admit date: 03/08/2024 Discharge date: 03/11/2024 30 Day Unplanned Readmission Risk Score    Flowsheet Row ED to Hosp-Admission (Current) from 03/08/2024 in [facility] 3E HF PCU  30 Day Unplanned Readmission Risk Score (%) 10.9 Filed at 03/11/2024 0801       This score is the patient's risk of an unplanned readmission within 30 days of being discharged (0 -100%). The score is based on dignosis, age, lab data, medications, orders, and past utilization.   Low:  0-14.9   Medium: 15-21.9   High: 22-29.9   Extreme: 30 and above          Admitted From: Home Disposition: Home  Recommendations for Outpatient Follow-up:  Follow up with PCP in 1-2 weeks Please obtain BMP/CBC in one week Follow-up with cardiology in 2 to 3 weeks Follow-up with GI in 8 to 12 weeks for repeat EGD Please follow up with your PCP on the following pending results: Unresulted Labs (From admission, onward)     Start     Ordered   03/11/24 0500  Basic metabolic panel  Tomorrow morning,   R        03/10/24 1147   03/10/24 0410  Hematocrit  Once,   R        03/10/24 0410   03/10/24 0410  Hemoglobin  Once,   R        03/10/24 0410   03/09/24 1321  Hemoglobin  Now then every 8 hours,   R (with TIMED occurrences)      03/09/24 1320   03/09/24 1321  Hematocrit  Now then every 8 hours,   R (with TIMED occurrences)      03/09/24 1320   03/09/24 1147  Hemoglobin and hematocrit, blood  Now then every 8 hours,   R (with TIMED occurrences)     Comments: Call MD for hgb  7.5 or less    03/09/24 1147              Home Health: None Equipment/Devices: None  Discharge Condition: Stable CODE STATUS: Full code Diet recommendation: Regular  Subjective: Seen and examined, no complaints.  Tolerating diet.  Eager to go home.  Brief/Interim Summary: Tyler Ortega is a 83 y.o. male with past  medical history of CAD s/p CABG and PCI, carotid artery stenosis, HTN, HLD and PFO s/p PFO, H/O embolic appearing infarcts in the cerebellum 2/2 to PFO  closure (12/03/2019), hyperlipidemia, skin cancer, presented to [facility] for hematemesis and melena since Friday.  Patient states that he was very diaphoretic and he went to med center [facility] few days ago where they checked his blood and said his blood dropped a little bit but he was asymptomatic and decided to come home and have a recheck and as his recheck was further lower for the hemoglobin count patient was admitted to [facility] for possible GI bleed.  Details of hospitalization below.   Hematemesis/melena/acute blood loss anemia secondary to gastric ulcer and gastritis due to NSAID use, POA: Patient tells me that recently he used about 1000 mg of ibuprofen 2-3 times a day for only 3 days.  Started having hematemesis and melena.  At baseline hemoglobin remains around 14-15 but hemoglobin upon admission was 8.2 which is 8.7 but improved to 9.2 today without any transfusion.  Underwent EGD 03/10/2024, was found to have nonbleeding gastric  ulcer which was treated as well as gastritis.  Biopsies were taken.  Patient is doing well post EGD.  Tolerating regular diet.  Hemoglobin stable and actually improved.  Aspirin  resumed today per GI recommendations.  GI has cleared him for discharge with instructions to avoid NSAIDs at all and try to take Tylenol  as needed and follow-up with them in 8 to 12 weeks for repeat EGD and continue Protonix/omeprazole 40 mg p.o. twice daily for 8 weeks followed by once daily.   History of CAD and now bradycardia: Cardiology was consulted for this,Per them, he has first-degree AV block and intermittent second-degree type I and second degree type II AV block but no pauses and patient is very active at baseline and currently asymptomatic.  Patient remained asymptomatic.  Heart rate is still low at times between 35 and  55.  They are going to arrange a Zio patch for him.   Prior PFO closure: by Dr. Arlester Ladd, after concern for embolic stroke in 2021 based on MRI findings and transient episode of altered awareness   CAD with prior CABG and PCI/hyperlipidemia: no symptoms.  Zoom aspirin  tomorrow per GI recommendations.  Continue atorvastatin   Discharge plan was discussed with patient and/or family member and they verbalized understanding and agreed with it.  Discharge Diagnoses:  Principal Problem:   Acute upper GI bleed Active Problems:   Symptomatic anemia   Melena   Mobitz type 2 second degree AV block   Mobitz type 1 second degree AV block   Gastric ulcer with hemorrhage   Upper GI bleed    Discharge Instructions   Allergies as of 03/11/2024   No Known Allergies      Medication List     TAKE these medications    amoxicillin  500 MG capsule Commonly known as: AMOXIL  Take 2,000 mg by mouth daily as needed (dental work). Dental visits   aspirin  EC 81 MG tablet Take 81 mg by mouth daily. Swallow whole.   atorvastatin  80 MG tablet Commonly known as: LIPITOR Take 1 tablet by mouth once daily   Multivitamin Men 50+ Tabs Take 1 tablet by mouth daily.   nitroGLYCERIN  0.4 MG SL tablet Commonly known as: NITROSTAT  Place 1 tablet (0.4 mg total) under the tongue every 5 (five) minutes x 3 doses as needed for chest pain.   omeprazole 40 MG capsule Commonly known as: PRILOSEC Take 1 capsule (40 mg total) by mouth 2 (two) times daily.   VITAMIN B-12 PO Take 1 tablet by mouth daily.        Follow-up Information     Ezell Hollow, MD Follow up in 1 week(s).   Specialty: Internal Medicine Contact information: [address] [place] [address] [phone]                No Known Allergies  Consultations: GI and cardiology   Procedures/Studies: No results found.   Discharge Exam: Vitals:   03/11/24 0427 03/11/24 0717  BP: 113/68 135/62  Pulse: 60 66   Resp: 18 18  Temp: 97.9 F (36.6 C) (!) 97.5 F (36.4 C)  SpO2: 98% 100%   Vitals:   03/10/24 1921 03/11/24 0009 03/11/24 0427 03/11/24 0717  BP: 134/61 (!) 105/56 113/68 135/62  Pulse: 71 (!) 37 60 66  Resp:  18 18 18   Temp: (!) 97.5 F (36.4 C) 97.9 F (36.6 C) 97.9 F (36.6 C) (!) 97.5 F (36.4 C)  TempSrc: Oral Oral Oral Oral  SpO2:  100% 99% 98% 100%  Weight:   76.7 kg   Height:        General: Pt is alert, awake, not in acute distress Cardiovascular: Sinus bradycardia, S1/S2 +, no rubs, no gallops Respiratory: CTA bilaterally, no wheezing, no rhonchi Abdominal: Soft, NT, ND, bowel sounds + Extremities: no edema, no cyanosis    The results of significant diagnostics from this hospitalization (including imaging, microbiology, ancillary and laboratory) are listed below for reference.     Microbiology: No results found for this or any previous visit (from the past 240 hours).   Labs: BNP (last 3 results) No results for input(s): "BNP" in the last 8760 hours. Basic Metabolic Panel: Recent Labs  Lab 03/06/24 1228 03/08/24 1904 03/09/24 2009  NA 141 143  --   K 4.2 3.6  --   CL 110 111  --   CO2 22 23  --   GLUCOSE 81 94  --   BUN 59* 30*  --   CREATININE 1.04 1.15  --   CALCIUM  8.9 8.5*  --   MG  --   --  2.1   Liver Function Tests: Recent Labs  Lab 03/06/24 1228 03/08/24 1904  AST 24 31  ALT 15 17  ALKPHOS 60 57  BILITOT 0.2 0.3  PROT 5.9* 5.7*  ALBUMIN 3.8 3.8   Recent Labs  Lab 03/06/24 1228  LIPASE 18   No results for input(s): "AMMONIA" in the last 168 hours. CBC: Recent Labs  Lab 03/06/24 1228 03/08/24 1904 03/09/24 1158 03/10/24 1140 03/10/24 1250 03/10/24 1919 03/10/24 2106 03/11/24 1011  WBC 7.3 7.3  --   --  4.6  --   --  5.7  NEUTROABS 5.5 4.8  --   --  2.9  --   --  3.9  HGB 10.9* 8.9*   < > 8.7* 8.7* 9.3* 9.2* 9.7*  HCT 32.5* 26.4*   < > 26.1* 26.4* 27.8* 27.6* 29.2*  MCV 101.2* 100.4*  --   --  101.9*  --   --   102.5*  PLT 142* 135*  --   --  140*  --   --  184   < > = values in this interval not displayed.   Cardiac Enzymes: No results for input(s): "CKTOTAL", "CKMB", "CKMBINDEX", "TROPONINI" in the last 168 hours. BNP: Invalid input(s): "POCBNP" CBG: No results for input(s): "GLUCAP" in the last 168 hours. D-Dimer No results for input(s): "DDIMER" in the last 72 hours. Hgb A1c No results for input(s): "HGBA1C" in the last 72 hours. Lipid Profile No results for input(s): "CHOL", "HDL", "LDLCALC", "TRIG", "CHOLHDL", "LDLDIRECT" in the last 72 hours. Thyroid  function studies Recent Labs    03/09/24 1334  TSH 1.323   Anemia work up No results for input(s): "VITAMINB12", "FOLATE", "FERRITIN", "TIBC", "IRON", "RETICCTPCT" in the last 72 hours. Urinalysis    Component Value Date/Time   COLORURINE YELLOW 03/06/2024 1228   APPEARANCEUR CLEAR 03/06/2024 1228   LABSPEC 1.020 03/06/2024 1228   PHURINE 5.5 03/06/2024 1228   GLUCOSEU NEGATIVE 03/06/2024 1228   HGBUR NEGATIVE 03/06/2024 1228   BILIRUBINUR NEGATIVE 03/06/2024 1228   KETONESUR NEGATIVE 03/06/2024 1228   PROTEINUR NEGATIVE 03/06/2024 1228   NITRITE NEGATIVE 03/06/2024 1228   LEUKOCYTESUR NEGATIVE 03/06/2024 1228   Sepsis Labs Recent Labs  Lab 03/06/24 1228 03/08/24 1904 03/10/24 1250 03/11/24 1011  WBC 7.3 7.3 4.6 5.7   Microbiology No results found for this or any previous visit (from the past 240  hours).  FURTHER DISCHARGE INSTRUCTIONS:   Get Medicines reviewed and adjusted: Please take all your medications with you for your next visit with your Primary MD   Laboratory/radiological data: Please request your Primary MD to go over all hospital tests and procedure/radiological results at the follow up, please ask your Primary MD to get all Hospital records sent to his/her office.   In some cases, they will be blood work, cultures and biopsy results pending at the time of your discharge. Please request that your  primary care M.D. goes through all the records of your hospital data and follows up on these results.   Also Note the following: If you experience worsening of your admission symptoms, develop shortness of breath, life threatening emergency, suicidal or homicidal thoughts you must seek medical attention immediately by calling 911 or calling your MD immediately  if symptoms less severe.   You must read complete instructions/literature along with all the possible adverse reactions/side effects for all the Medicines you take and that have been prescribed to you. Take any new Medicines after you have completely understood and accpet all the possible adverse reactions/side effects.    patient was instructed, not to drive, operate heavy machinery, perform activities at heights, swimming or participation in water activities or provide baby-sitting services while on Pain, Sleep and Anxiety Medications; until their outpatient Physician has advised to do so again. Also recommended to not to take more than prescribed Pain, Sleep and Anxiety Medications.  It is not advisable to combine anxiety, sleep and pain medications without talking with your primary care provider.     Wear Seat belts while driving.   Please note: You were cared for by a hospitalist during your hospital stay. Once you are discharged, your primary care physician will handle any further medical issues. Please note that NO REFILLS for any discharge medications will be authorized once you are discharged, as it is imperative that you return to your primary care physician (or establish a relationship with a primary care physician if you do not have one) for your post hospital discharge needs so that they can reassess your need for medications and monitor your lab values  Time coordinating discharge: Over 30 minutes  SIGNED:   Modena Andes, MD  [facility] 03/11/2024, 11:09 AM *Please note that this is a verbal dictation therefore any  spelling or grammatical errors are due to the "Dragon [employer] One" system interpretation. If 7PM-7AM, please contact night-coverage [email or web address]

## 2024-03-12 LAB — SURGICAL PATHOLOGY

## 2024-03-14 ENCOUNTER — Ambulatory Visit: Payer: Self-pay | Admitting: Pediatrics

## 2024-03-15 NOTE — Anesthesia Postprocedure Evaluation (Signed)
 Anesthesia Post Note  Patient: Tyler Ortega  Procedure(s) Performed: EGD (ESOPHAGOGASTRODUODENOSCOPY)     Patient location during evaluation: Endoscopy Anesthesia Type: MAC Level of consciousness: awake and alert Pain management: pain level controlled Vital Signs Assessment: post-procedure vital signs reviewed and stable Respiratory status: spontaneous breathing, nonlabored ventilation and respiratory function stable Cardiovascular status: stable and blood pressure returned to baseline Postop Assessment: no apparent nausea or vomiting Anesthetic complications: no   There were no known notable events for this encounter.              Tehran Rabenold

## 2024-03-16 ENCOUNTER — Other Ambulatory Visit: Payer: Self-pay | Admitting: Internal Medicine

## 2024-03-16 ENCOUNTER — Ambulatory Visit (INDEPENDENT_AMBULATORY_CARE_PROVIDER_SITE_OTHER): Admitting: Internal Medicine

## 2024-03-16 ENCOUNTER — Encounter: Payer: Self-pay | Admitting: Internal Medicine

## 2024-03-16 VITALS — BP 126/76 | HR 49 | Temp 97.6°F | Resp 16 | Ht 68.0 in | Wt 173.2 lb

## 2024-03-16 DIAGNOSIS — K253 Acute gastric ulcer without hemorrhage or perforation: Secondary | ICD-10-CM

## 2024-03-16 DIAGNOSIS — D500 Iron deficiency anemia secondary to blood loss (chronic): Secondary | ICD-10-CM | POA: Diagnosis not present

## 2024-03-16 NOTE — Patient Instructions (Addendum)
 No more NSAIDs  If you have a ache or pain, Tylenol  is okay.    GO TO THE LAB :  Get the blood work   Your results will be posted on MyChart with my comments  See you in September

## 2024-03-16 NOTE — Progress Notes (Unsigned)
 Subjective:    Patient ID: Tyler Ortega, male    DOB: 25-Jul-1941, 83 y.o.   MRN: [id number]  DOS:  03/16/2024 Type of visit - description: Hospital follow-up.  Admitted to hospital 6/2, discharge 3 days later. Presented to the ER with  melena, diaphoresis,diarrhea , nausea .  He was taking NSAIDs PTA.  In the ER he was found to have anemia, hemoglobin dropped to 8.2. EGD 03/10/2024: Nonbleeding gastric ulcer, gastritis.  Subsequently hemoglobin stabilized.  Aspirin  restarted Discharge home on PPIs twice daily for 8 weeks, then daily. Had bradycardia, cardiology consulted, heart rate was between 35 and 55.   Since he left the hospital, feeling well. No nausea or vomiting. No abdominal pain. Stools are normal. Appetite is great.  Denies chest pain, difficulty breathing.  He remains bradycardic but denies near syncope.    Review of Systems See above   Past Medical History:  Diagnosis Date   Arthritis    osteoarthritis -hips, back pain tx with Prednisone -last dose 09-04-15.   CAD (coronary artery disease)    MI 2011, seen @ [facility], sees cards regulalry (once a year)   Hyperlipidemia    Hypertension    Skin cancer    R calf and the ear (?side)   Transfusion history    2nd Hip surgery    Past Surgical History:  Procedure Laterality Date   BUBBLE STUDY  11/18/2019   Procedure: BUBBLE STUDY;  Surgeon: Hugh Madura, MD;  Location: [facility] ENDOSCOPY;  Service: Cardiovascular;;   COLONOSCOPY  05/29/11   Normal   CORONARY ARTERY BYPASS GRAFT     05-2010-x4 vessel [facility]   ESOPHAGOGASTRODUODENOSCOPY N/A 03/10/2024   Procedure: EGD (ESOPHAGOGASTRODUODENOSCOPY);  Surgeon: Truddie Furrow, MD;  Location: [facility] ENDOSCOPY;  Service: Gastroenterology;  Laterality: N/A;   EYE SURGERY Bilateral 2019   cataract   HIP SURGERY Left    x3 - L hip (initial hip replacement bleed)   INGUINAL HERNIA REPAIR Left    PATENT FORAMEN OVALE(PFO) CLOSURE N/A 12/03/2019   Procedure:  PATENT FORAMEN OVALE (PFO) CLOSURE;  Surgeon: Arnoldo Lapping, MD;  Location: [facility] INVASIVE CV LAB;  Service: Cardiovascular;  Laterality: N/A;   TEE WITHOUT CARDIOVERSION N/A 11/18/2019   Procedure: TRANSESOPHAGEAL ECHOCARDIOGRAM (TEE);  Surgeon: Hugh Madura, MD;  Location: [facility] ENDOSCOPY;  Service: Cardiovascular;  Laterality: N/A;   TOTAL HIP ARTHROPLASTY Right 09/12/2015   Procedure: RIGHT TOTAL HIP ARTHROPLASTY ANTERIOR APPROACH;  Surgeon: Claiborne Crew, MD;  Location: [facility] ORS;  Service: Orthopedics;  Laterality: Right;   VASECTOMY      Current Outpatient Medications  Medication Instructions   amoxicillin  (AMOXIL ) 2,000 mg, Daily PRN   aspirin  EC 81 mg, Daily   atorvastatin  (LIPITOR) 80 mg, Oral, Daily   Cyanocobalamin  (VITAMIN B-12 PO) 1 tablet, Daily   Multiple Vitamins-Minerals (MULTIVITAMIN MEN 50+) TABS 1 tablet, Daily   nitroGLYCERIN  (NITROSTAT ) 0.4 mg, Sublingual, Every 5 min x3 PRN   omeprazole  (PRILOSEC) 40 mg, Oral, 2 times daily       Objective:   Physical Exam BP 126/76   Pulse (!) 49   Temp 97.6 F (36.4 C) (Oral)   Resp 16   Ht 5\' 8"  (1.727 m)   Wt 173 lb 4 oz (78.6 kg)   SpO2 97%   BMI 26.34 kg/m  General:   Well developed, NAD, BMI noted.  HEENT:  Normocephalic . Face symmetric, atraumatic Lungs:  CTA B Normal respiratory effort, no intercostal retractions, no accessory muscle use. Heart:  Bradycardic. Abdomen:  Not distended, soft, non-tender. No rebound or rigidity.   Skin: Not pale. Not jaundice Lower extremities: no pretibial edema bilaterally  Neurologic:  alert & oriented X3.  Speech normal, gait appropriate for age and unassisted Psych--  Cognition and judgment appear intact.  Cooperative with normal attention span and concentration.  Behavior appropriate. No anxious or depressed appearing.     Assessment   Assessment Hyperglycemia  A1c 6.45/2017 Hyperlipidemia CAD: Dr. Laverna Pott --MI and CABG 2011 --DOE> admitted>  cath 07-2017, had a  stent --Transient amnesia triggered a w/u: *MRI: strokes noted *ECHO --PFO closure (11/2019) , dx after a stroke   *Carotid artery dz, see CTA neck 09/2019 GI bleeding: Hg dropped to 4.8 after cath 07/2017, + hematemesis  EGD EG junction ulceration endoscopy  >>clipped Transient amnesia: saw neuro.  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 (likely chronic, unrelated finding per neurology); EEG (-) B12 def dx 02-2106 ABX pre dental work request by ortho d/t hip surgeries    PLAN: Acute anemia, gastric ulcer: Admitted to hospital with hematemesis, melena.  Hemoglobin upon admission dropped to 8.2 in the context of taking ibuprofen ~ 8-9 pills every day x 2 days PTA EGD showed a gastric ulcer, gastritis, BX: H. pylori negative, acute gastritis. On PPIs twice daily for 8 weeks then daily. Plan:  BMP CBC anemia panel. Cont PPIs as rec No NSAIDs other than ASA, tylenol  ok.  Patient is very aware of this. F/u w/ GI Sinus bradycardia; 1st degree AV block; Intermittent second degree type I and second degree type II AV block  Was noted to be bradycardic while in the hospital, saw cardiology, wearing a  Zio patch.  TSH was normal at the hospital. Still bradycardic but asx.  Further steps per cardiology RTC as scheduled for September     3/25 Here for CPX -Td 2019 - Pneumonia shot: 2015;  prevnar: 2016.  PNM 20: 2023 - s/p shingrix  -07/23/2023 had a covid vax  -Vaccines to consider: RSV, flu shot every fall --CCS and prostate cancer : No further screening.   --Labs: See orders - life style: Continue to be very active, still works 3 times a week, teaches Sunday school - POA on file  We also address the following issues. Change in bowel habits: New issue.  For the last year he is having constipation alternating with diarrhea. Will check a CBC, TSH.  Plan: Refer  to GI.  Colonoscopy?[name] Continue probiotics,  add Metamucil. Prediabetes: Has a healthy lifestyle, check A1c. Hyperlipidemia:On atorvastatin  80, checking FLP. CAD: On aspirin , asymptomatic. Shoulder pain: On and off issue, plans to see Ortho.  Take occasional ibuprofen recommend to try Tylenol  first and only sporadic ibuprofen if needed. RTC 6 months

## 2024-03-17 ENCOUNTER — Other Ambulatory Visit (INDEPENDENT_AMBULATORY_CARE_PROVIDER_SITE_OTHER)

## 2024-03-17 DIAGNOSIS — D500 Iron deficiency anemia secondary to blood loss (chronic): Secondary | ICD-10-CM

## 2024-03-17 LAB — BASIC METABOLIC PANEL WITH GFR
BUN/Creatinine Ratio: 16 (calc) (ref 6–22)
BUN: 20 mg/dL (ref 7–25)
CO2: 25 mmol/L (ref 20–32)
Calcium: 8.5 mg/dL — ABNORMAL LOW (ref 8.6–10.3)
Chloride: 107 mmol/L (ref 98–110)
Creat: 1.24 mg/dL — ABNORMAL HIGH (ref 0.70–1.22)
Glucose, Bld: 87 mg/dL (ref 65–99)
Potassium: 4.4 mmol/L (ref 3.5–5.3)
Sodium: 140 mmol/L (ref 135–146)
eGFR: 58 mL/min/{1.73_m2} — ABNORMAL LOW (ref >=60)

## 2024-03-17 LAB — CBC WITH DIFFERENTIAL/PLATELET
Absolute Lymphocytes: 891 {cells}/uL (ref 850–3900)
Absolute Monocytes: 293 {cells}/uL (ref 200–950)
Basophils Absolute: 32 {cells}/uL (ref 0–200)
Basophils Relative: 0.7 %
Eosinophils Absolute: 261 {cells}/uL (ref 15–500)
Eosinophils Relative: 5.8 %
HCT: 29.2 % — ABNORMAL LOW (ref 38.5–50.0)
Hemoglobin: 9.3 g/dL — ABNORMAL LOW (ref 13.2–17.1)
MCH: 32.9 pg (ref 27.0–33.0)
MCHC: 31.8 g/dL — ABNORMAL LOW (ref 32.0–36.0)
MCV: 103.2 fL — ABNORMAL HIGH (ref 80.0–100.0)
MPV: 10.8 fL (ref 7.5–12.5)
Monocytes Relative: 6.5 %
Neutro Abs: 3024 {cells}/uL (ref 1500–7800)
Neutrophils Relative %: 67.2 %
Platelets: 211 10*3/uL (ref 140–400)
RBC: 2.83 10*6/uL — ABNORMAL LOW (ref 4.20–5.80)
RDW: 12.4 % (ref 11.0–15.0)
Total Lymphocyte: 19.8 %
WBC: 4.5 10*3/uL (ref 3.8–10.8)

## 2024-03-17 LAB — IRON,TIBC AND FERRITIN PANEL
%SAT: 15 % — ABNORMAL LOW (ref 20–48)
Ferritin: 60 ng/mL (ref 24–380)
Iron: 35 ug/dL — ABNORMAL LOW (ref 50–180)
TIBC: 241 ug/dL — ABNORMAL LOW (ref 250–425)

## 2024-03-18 ENCOUNTER — Ambulatory Visit: Admitting: Gastroenterology

## 2024-03-18 ENCOUNTER — Ambulatory Visit: Payer: Self-pay | Admitting: Internal Medicine

## 2024-03-18 DIAGNOSIS — I2510 Atherosclerotic heart disease of native coronary artery without angina pectoris: Secondary | ICD-10-CM

## 2024-03-18 MED ORDER — FERROUS FUMARATE 325 (106 FE) MG PO TABS
1.0000 | ORAL_TABLET | Freq: Every day | ORAL | 1 refills | Status: DC
Start: 2024-03-18 — End: 2024-06-23

## 2024-03-18 NOTE — Assessment & Plan Note (Signed)
 Acute anemia, gastric ulcer: Admitted to hospital with hematemesis, melena.  Hemoglobin upon admission dropped to 8.2 in the context of taking ibuprofen ~ 8-9 pills every day x 2 days PTA EGD showed a gastric ulcer, gastritis, BX: H. pylori negative, acute gastritis. On PPIs twice daily for 8 weeks then daily. Plan:  BMP CBC anemia panel. Cont PPIs as rec No NSAIDs other than ASA, tylenol  ok.  Patient is very aware of this. F/u w/ GI Sinus bradycardia; 1st degree AV block; Intermittent second degree type I and second degree type II AV block  Was noted to be bradycardic while in the hospital, saw cardiology, wearing a  Zio patch.  TSH was normal at the hospital. Still bradycardic but asx.  Further steps per cardiology RTC as scheduled for September

## 2024-04-07 DIAGNOSIS — I441 Atrioventricular block, second degree: Secondary | ICD-10-CM | POA: Diagnosis not present

## 2024-04-08 ENCOUNTER — Ambulatory Visit: Payer: Self-pay

## 2024-04-12 NOTE — Progress Notes (Signed)
 OFFICE NOTE:    Date:  04/13/2024  ID:  Tyler Ortega, DOB 02/08/1941, MRN [id number] PCP: Amon Aloysius BRAVO, MD  [facility] Providers Cardiologist:  Ozell Fell, MD        Coronary artery disease  MI s/p CABG 05/2010 [facility]) S/p 3 x 16 mm DES to LM/oLCx 07/2017 ([facility]) PCI c/b UGI bleeding, hematemesis  LHC 07/07/17: L-LAD, S-RCA, S-Dx, S-LCx patent  TTE 11/30/20: EF 50, mild septal HK, Gr 1 DD, mildly reduced RVSF, mild RVE, NL PASP, RVSP 29.9, trivial MR, mild AV sclerosis, mild ascending aorta dilation (39 mm), neg bubble study, PFO device well seated Patent Foramen Ovale s/p closure 11/2019 1st degree AV block  Intermittent 2nd degree AV Block Type I and Type II  Monitor 04/2024: 1st degree AVB, 3.2 sec pause x 1 during sleep, rare Mobitz 1 during sleep Carotid US  11/21/21: Bilat ICA 1-39 Hypertension  Hyperlipidemia       Discussed the use of AI scribe software for clinical note transcription with the patient, who gave verbal consent to proceed. History of Present Illness Tyler Ortega is a 83 y.o. male who returns for post hospitalization follow up. He was admitted 6/2-6/5 with UGI bleeding (did not require transfusion). He was seen by Cardiology for evaluation of Mobitz 1 and Mobitz 2. Pt was felt to be asymptomatic and was set up for OP Zio monitor. Monitor showed NSR, 1st degree AVB, transient 2nd degree AVB type I during sleep and one 3.2 sec pause during sleep.   He is here with his wife.  Since discharge, he has not had any symptoms of chest discomfort, shortness of breath, near-syncope or syncope. He reports a history of snoring and has not been tested for sleep apnea. His wife notes that he sometimes stops breathing at night. He has not experienced any significant symptoms during the day and maintains an active lifestyle, working as a Occupational psychologist and engaging in regular exercise despite having had four hip surgeries.    ROS-See  HPI    Studies Reviewed:      Results LABS Total cholesterol: 128 mg/dL (96/81/7974) HDL: 59 mg/dL (96/81/7974) LDL: 58 mg/dL (96/81/7974) Triglycerides: 55 mg/dL (96/81/7974) Hemoglobin: 9.3 g/dL (93/88/7974) Creatinine: 1.24 mg/dL (93/88/7974) Potassium: 4.4 mmol/L (03/17/2024) ALT: 17 U/L (03/08/2024) TSH: 1.323 IU/mL (03/09/2024)  Risk Assessment/Calculations:       STOP-Bang Score:  5     Physical Exam:  VS:  BP 110/68   Pulse 60   Ht 5' 8 (1.727 m)   Wt 171 lb 12.8 oz (77.9 kg)   SpO2 97%   BMI 26.12 kg/m        Wt Readings from Last 3 Encounters:  04/13/24 171 lb 12.8 oz (77.9 kg)  03/16/24 173 lb 4 oz (78.6 kg)  03/11/24 169 lb (76.7 kg)    Constitutional:      Appearance: Healthy appearance. Not in distress.  Neck:     Vascular: JVD normal.  Pulmonary:     Breath sounds: Normal breath sounds. No wheezing. No rales.  Cardiovascular:     Bradycardia present. Irregular rhythm.     Murmurs: There is no murmur.  Edema:    Peripheral edema absent.  Abdominal:     Palpations: Abdomen is soft.       Assessment and Plan:    Assessment & Plan Coronary artery disease involving native coronary artery of native heart without angina pectoris History of myocardial infarction followed by  CABG in 2011 and subsequent PCI with DES to the left main/ostial LCx in October 2018 at [place].  He is doing well without chest pain to suggest angina. -Continue ASA 81 mg daily, Lipitor 80 mg daily, nitroglycerin  as needed -Follow-up 6 months Mobitz type 2 second degree AV block He has been noted to have second-degree AV block in the past.  He is quite active and is asymptomatic.  He was not felt to need pacemaker implantation when seen in the hospital by our service.  Follow-up outpatient monitor did not demonstrate any high-grade AV block.  He has not had any history of syncope or decreased exercise tolerance. -Continue to avoid AV nodal blocking agents Pure  hypercholesterolemia Recent LDL optimal. -Continue Lipitor 80 mg daily Upper GI bleed Occurred in the setting of excessive NSAID use and gastric ulcer.  He has not seen any further bleeding.  Follow-up with gastroenterology as planned. Snoring STOP-Bang Score:  5   His wife notes a history of snoring as well as witnessed apnea.  He admits to daytime hypersomnolence.  He had one 3.2-second pause on his monitor as well as Mobitz 1 during sleep. -Arrange home sleep study -If he has sleep apnea, he may be a good candidate for Inspire         Dispo:  Return in about 6 months (around 10/14/2024) for Routine Follow Up, w/ Dr. Wonda.  Signed, Glendia Ferrier, PA-C

## 2024-04-13 ENCOUNTER — Telehealth: Payer: Self-pay | Admitting: Radiology

## 2024-04-13 ENCOUNTER — Encounter: Payer: Self-pay | Admitting: Physician Assistant

## 2024-04-13 ENCOUNTER — Ambulatory Visit: Attending: Physician Assistant | Admitting: Physician Assistant

## 2024-04-13 VITALS — BP 110/68 | HR 60 | Ht 68.0 in | Wt 171.8 lb

## 2024-04-13 DIAGNOSIS — I10 Essential (primary) hypertension: Secondary | ICD-10-CM

## 2024-04-13 DIAGNOSIS — E7800 Pure hypercholesterolemia, unspecified: Secondary | ICD-10-CM | POA: Diagnosis not present

## 2024-04-13 DIAGNOSIS — I441 Atrioventricular block, second degree: Secondary | ICD-10-CM

## 2024-04-13 DIAGNOSIS — R0683 Snoring: Secondary | ICD-10-CM | POA: Diagnosis not present

## 2024-04-13 DIAGNOSIS — K922 Gastrointestinal hemorrhage, unspecified: Secondary | ICD-10-CM

## 2024-04-13 DIAGNOSIS — I2510 Atherosclerotic heart disease of native coronary artery without angina pectoris: Secondary | ICD-10-CM | POA: Diagnosis not present

## 2024-04-13 NOTE — Progress Notes (Unsigned)
 Chief Complaint:hospital follow-up Primary GI Doctor:Dr. Avram  HPI:  Tyler Ortega is an 83 year old gentleman with a past medical history noteworthy for CAD, MI in 2011 status postcardiac stent placement, HTN, and HLD.  Pt admitted to the hospital with dark stools and anemia. Hemoglobin on admission 8.9 down from previous baseline of 14. No nausea, vomiting, abdominal pain. Reports that prior to his hospitalization he was taking ibuprofen on a regular basis for back pain. Previous history of upper GI bleeding at the time of cardiac cath when he had antral gastritis and a small GE junction ulceration that was hemoclipped. He takes aspirin , but is not on other anticoagulants.   04/13/24 cardiology appointment -He has been noted to have second-degree AV block in the past. He is quite active and is asymptomatic. He was not felt to need pacemaker implantation when seen in the hospital by our service. Follow-up outpatient monitor did not demonstrate any high-grade AV block. He has not had any history of syncope or decreased exercise tolerance. (Reviewed entire note)    Interval History  Patient admits/denies GERD Patient admits/denies dysphagia Patient admits/denies nausea, vomiting, or weight loss  Patient admits/denies altered bowel habits Patient admits/denies abdominal pain Patient admits/denies rectal bleeding   Denies/Admits alcohol Denies/Admits smoking Denies/Admits NSAID use. Denies/Admits they are on blood thinners.  Patient's family history includes  GI procedures EGD 03/10/2024 - Normal esophagus. - Non- bleeding gastric ulcer. Likely NSAID induced. Treated with Purastat. - Gastritis. - Normal cardia and gastric fundus. - Normal duodenal bulb and second portion of the duodenum with the exception of a single nonbleeding erosion seen at the duodenal sweep. - Biopsies were taken with a cold forceps for Helicobacter pylori testing.  Colonoscopy 05/2011 with Dr. Avram Normal  colonoscopy     Wt Readings from Last 3 Encounters:  04/13/24 171 lb 12.8 oz (77.9 kg)  03/16/24 173 lb 4 oz (78.6 kg)  03/11/24 169 lb (76.7 kg)      Past Medical History:  Diagnosis Date   Arthritis    osteoarthritis -hips, back pain tx with Prednisone -last dose 09-04-15.   CAD (coronary artery disease)    MI 2011, seen @ [facility], sees cards regulalry (once a year)   Hyperlipidemia    Hypertension    Skin cancer    R calf and the ear (?side)   Transfusion history    2nd Hip surgery    Past Surgical History:  Procedure Laterality Date   BUBBLE STUDY  11/18/2019   Procedure: BUBBLE STUDY;  Surgeon: Jeffrie Oneil BROCKS, MD;  Location: [facility] ENDOSCOPY;  Service: Cardiovascular;;   COLONOSCOPY  05/29/11   Normal   CORONARY ARTERY BYPASS GRAFT     05-2010-x4 vessel [facility]   ESOPHAGOGASTRODUODENOSCOPY N/A 03/10/2024   Procedure: EGD (ESOPHAGOGASTRODUODENOSCOPY);  Surgeon: Suzann Inocente HERO, MD;  Location: [facility] ENDOSCOPY;  Service: Gastroenterology;  Laterality: N/A;   EYE SURGERY Bilateral 2019   cataract   HIP SURGERY Left    x3 - L hip (initial hip replacement bleed)   INGUINAL HERNIA REPAIR Left    PATENT FORAMEN OVALE(PFO) CLOSURE N/A 12/03/2019   Procedure: PATENT FORAMEN OVALE (PFO) CLOSURE;  Surgeon: Wonda Sharper, MD;  Location: [facility] INVASIVE CV LAB;  Service: Cardiovascular;  Laterality: N/A;   TEE WITHOUT CARDIOVERSION N/A 11/18/2019   Procedure: TRANSESOPHAGEAL ECHOCARDIOGRAM (TEE);  Surgeon: Jeffrie Oneil BROCKS, MD;  Location: [facility] ENDOSCOPY;  Service: Cardiovascular;  Laterality: N/A;   TOTAL HIP ARTHROPLASTY Right 09/12/2015   Procedure: RIGHT TOTAL HIP  ARTHROPLASTY ANTERIOR APPROACH;  Surgeon: Donnice Car, MD;  Location: [facility] ORS;  Service: Orthopedics;  Laterality: Right;   VASECTOMY      Current Outpatient Medications  Medication Sig Dispense Refill   amoxicillin  (AMOXIL ) 500 MG capsule Take 2,000 mg by mouth daily as needed (dental work). Dental visits     aspirin  EC  81 MG tablet Take 81 mg by mouth daily. Swallow whole.     atorvastatin  (LIPITOR) 80 MG tablet Take 1 tablet (80 mg total) by mouth daily. 90 tablet 1   Cyanocobalamin  (VITAMIN B-12 PO) Take 1 tablet by mouth daily.     ferrous fumarate  (HEMOCYTE - 106 MG FE) 325 (106 Fe) MG TABS tablet Take 1 tablet (106 mg of iron total) by mouth daily. 90 tablet 1   Multiple Vitamins-Minerals (MULTIVITAMIN MEN 50+) TABS Take 1 tablet by mouth daily.     nitroGLYCERIN  (NITROSTAT ) 0.4 MG SL tablet Place 1 tablet (0.4 mg total) under the tongue every 5 (five) minutes x 3 doses as needed for chest pain. (Patient not taking: Reported on 03/16/2024) 25 tablet 0   omeprazole  (PRILOSEC) 40 MG capsule Take 1 capsule (40 mg total) by mouth 2 (two) times daily. 120 capsule 0   No current facility-administered medications for this visit.   Facility-Administered Medications Ordered in Other Visits  Medication Dose Route Frequency Provider Last Rate Last Admin   sodium chloride  flush (NS) 0.9 % injection 10 mL  10 mL Intravenous PRN Sebastian Lamarr SAUNDERS, PA-C   10 mL at 11/30/20 1324    Allergies as of 04/14/2024   (No Known Allergies)    Family History  Problem Relation Age of Onset   Other Mother 49       old age   Other Father 79       tumor on neck   Coronary artery disease Neg Hx    Diabetes Neg Hx    Colon cancer Neg Hx    Prostate cancer Neg Hx    Seizures Neg Hx    Multiple sclerosis Neg Hx    Migraines Neg Hx    Dementia Neg Hx     Review of Systems:    Constitutional: No weight loss, fever, chills, weakness or fatigue HEENT: Eyes: No change in vision               Ears, Nose, Throat:  No change in hearing or congestion Skin: No rash or itching Cardiovascular: No chest pain, chest pressure or palpitations   Respiratory: No SOB or cough Gastrointestinal: See HPI and otherwise negative Genitourinary: No dysuria or change in urinary frequency Neurological: No headache, dizziness or  syncope Musculoskeletal: No new muscle or joint pain Hematologic: No bleeding or bruising Psychiatric: No history of depression or anxiety    Physical Exam:  Vital signs: There were no vitals taken for this visit.  Constitutional:   Pleasant *** male appears to be in NAD, Well developed, Well nourished, alert and cooperative Head:  Normocephalic and atraumatic. Eyes:   PEERL, EOMI. No icterus. Conjunctiva pink. Ears:  Normal auditory acuity. Neck:  Supple Throat: Oral cavity and pharynx without inflammation, swelling or lesion.  Respiratory: Respirations even and unlabored. Lungs clear to auscultation bilaterally.   No wheezes, crackles, or rhonchi.  Cardiovascular: Normal S1, S2. Regular rate and rhythm. No peripheral edema, cyanosis or pallor.  Gastrointestinal:  Soft, nondistended, nontender. No rebound or guarding. Normal bowel sounds. No appreciable masses or hepatomegaly. Rectal:  Not performed.  Anoscopy:  Msk:  Symmetrical without gross deformities. Without edema, no deformity or joint abnormality.  Neurologic:  Alert and  oriented x4;  grossly normal neurologically.  Skin:   Dry and intact without significant lesions or rashes. Psychiatric: Oriented to person, place and time. Demonstrates good judgement and reason without abnormal affect or behaviors.  RELEVANT LABS AND IMAGING: CBC    Latest Ref Rng & Units 03/17/2024   10:33 AM 03/11/2024    1:24 PM 03/11/2024   10:11 AM  CBC  WBC 3.8 - 10.8 Thousand/uL 4.5   5.7   Hemoglobin 13.2 - 17.1 g/dL 9.3  9.0  9.7   Hematocrit 38.5 - 50.0 % 29.2  27.0  29.2   Platelets 140 - 400 Thousand/uL 211   184      CMP     Latest Ref Rng & Units 03/17/2024   10:33 AM 03/11/2024   10:11 AM 03/08/2024    7:04 PM  CMP  Glucose 65 - 99 mg/dL 87  899  94   BUN 7 - 25 mg/dL 20  16  30    Creatinine 0.70 - 1.22 mg/dL 8.75  8.91  8.84   Sodium 135 - 146 mmol/L 140  138  143   Potassium 3.5 - 5.3 mmol/L 4.4  3.8  3.6   Chloride 98 - 110  mmol/L 107  107  111   CO2 20 - 32 mmol/L 25  26  23    Calcium  8.6 - 10.3 mg/dL 8.5  8.7  8.5   Total Protein 6.5 - 8.1 g/dL   5.7   Total Bilirubin 0.0 - 1.2 mg/dL   0.3   Alkaline Phos 38 - 126 U/L   57   AST 15 - 41 U/L   31   ALT 0 - 44 U/L   17      Lab Results  Component Value Date   TSH 1.323 03/09/2024  2/22 echo-  Left ventricular ejection fraction, by estimation, is 50%.    Assessment:   AV block-intermittent second-degree-cardiology following-plan is for Zio patch as an outpatient  history of previous CVA, PFO closure February 2021  coronary artery disease status post previous CABG and PCI-only on aspirin  at home  Plan: twice daily PPI/Protonix  on discharge x 8 weeks, then probably once daily.  No NSAIDs Will need office follow-up and plan for repeat EGD in 8 to 12 weeks to confirm healing    Thank you for the courtesy of this consult. Please call me with any questions or concerns.   Tyler Friedmann, Tyler Ortega [facility] 04/13/2024, 1:05 PM  Cc: Tyler Aloysius BRAVO, MD

## 2024-04-13 NOTE — Assessment & Plan Note (Signed)
Recent LDL optimal.  Continue Lipitor 80 mg daily.

## 2024-04-13 NOTE — Telephone Encounter (Signed)
 Patient agreement reviewed and signed on 04/13/2024.  WatchPAT issued to patient on 04/13/2024 by Delon ORN. Patient aware to not open the [facility] box until contacted with the activation PIN. Patient profile initialized in CloudPAT on 04/13/2024 by Rockie RAMAN. Device serial number: [id number]  Please list Reason for Call as Advice Only and type WatchPAT issued to patient in the comment box.

## 2024-04-13 NOTE — Patient Instructions (Signed)
 Medication Instructions:  Your physician recommends that you continue on your current medications as directed. Please refer to the Current Medication list given to you today.  *If you need a refill on your cardiac medications before your next appointment, please call your pharmacy*  Lab Work: None ordered  If you have labs (blood work) drawn today and your tests are completely normal, you will receive your results only by: MyChart Message (if you have MyChart) OR A paper copy in the mail If you have any lab test that is abnormal or we need to change your treatment, we will call you to review the results.  Testing/Procedures: Your physician has recommended that you have a sleep study. This test records several body functions during sleep, including: brain activity, eye movement, oxygen and carbon dioxide blood levels, heart rate and rhythm, breathing rate and rhythm, the flow of air through your mouth and nose, snoring, body muscle movements, and chest and belly movement.   Follow-Up: At [facility], you and your health needs are our priority.  As part of our continuing mission to provide you with exceptional heart care, our providers are all part of one team.  This team includes your primary Cardiologist (physician) and Advanced Practice Providers or APPs (Physician Assistants and Nurse Practitioners) who all work together to provide you with the care you need, when you need it.  Your next appointment:   6 month(s)  Provider:   Ozell Fell, MD    We recommend signing up for the patient portal called MyChart.  Sign up information is provided on this After Visit Summary.  MyChart is used to connect with patients for Virtual Visits (Telemedicine).  Patients are able to view lab/test results, encounter notes, upcoming appointments, etc.  Non-urgent messages can be sent to your provider as well.   To learn more about what you can do with MyChart, go to [email or web address].    Other Instructions

## 2024-04-13 NOTE — Assessment & Plan Note (Signed)
 Occurred in the setting of excessive NSAID use and gastric ulcer.  He has not seen any further bleeding.  Follow-up with gastroenterology as planned.

## 2024-04-13 NOTE — Assessment & Plan Note (Signed)
 He has been noted to have second-degree AV block in the past.  He is quite active and is asymptomatic.  He was not felt to need pacemaker implantation when seen in the hospital by our service.  Follow-up outpatient monitor did not demonstrate any high-grade AV block.  He has not had any history of syncope or decreased exercise tolerance. -Continue to avoid AV nodal blocking agents

## 2024-04-13 NOTE — Assessment & Plan Note (Signed)
 History of myocardial infarction followed by CABG in 2011 and subsequent PCI with DES to the left main/ostial LCx in October 2018 at [place].  He is doing well without chest pain to suggest angina. -Continue ASA 81 mg daily, Lipitor 80 mg daily, nitroglycerin  as needed -Follow-up 6 months

## 2024-04-14 ENCOUNTER — Ambulatory Visit: Admitting: Gastroenterology

## 2024-04-14 ENCOUNTER — Other Ambulatory Visit (INDEPENDENT_AMBULATORY_CARE_PROVIDER_SITE_OTHER)

## 2024-04-14 ENCOUNTER — Ambulatory Visit (INDEPENDENT_AMBULATORY_CARE_PROVIDER_SITE_OTHER)
Admission: RE | Admit: 2024-04-14 | Discharge: 2024-04-14 | Disposition: A | Discharge status: Home / Self Care | Source: Ambulatory Visit | Attending: Gastroenterology | Admitting: Gastroenterology

## 2024-04-14 ENCOUNTER — Encounter: Payer: Self-pay | Admitting: Gastroenterology

## 2024-04-14 VITALS — BP 132/62 | HR 67 | Ht 68.0 in | Wt 172.0 lb

## 2024-04-14 DIAGNOSIS — K254 Chronic or unspecified gastric ulcer with hemorrhage: Secondary | ICD-10-CM | POA: Diagnosis not present

## 2024-04-14 DIAGNOSIS — K5900 Constipation, unspecified: Secondary | ICD-10-CM | POA: Diagnosis not present

## 2024-04-14 DIAGNOSIS — R151 Fecal smearing: Secondary | ICD-10-CM

## 2024-04-14 DIAGNOSIS — R109 Unspecified abdominal pain: Secondary | ICD-10-CM | POA: Diagnosis not present

## 2024-04-14 DIAGNOSIS — R194 Change in bowel habit: Secondary | ICD-10-CM | POA: Diagnosis not present

## 2024-04-14 DIAGNOSIS — K922 Gastrointestinal hemorrhage, unspecified: Secondary | ICD-10-CM

## 2024-04-14 LAB — HEMATOCRIT: HCT: 38.1 % — ABNORMAL LOW (ref 39.0–52.0)

## 2024-04-14 LAB — HEMOGLOBIN: Hemoglobin: 12.7 g/dL — ABNORMAL LOW (ref 13.0–17.0)

## 2024-04-14 MED ORDER — OMEPRAZOLE 40 MG PO CPDR: 40.0000 mg | DELAYED_RELEASE_CAPSULE | Freq: Two times a day (BID) | ORAL | 0 refills | Status: DC

## 2024-04-14 MED ORDER — NA SULFATE-K SULFATE-MG SULF 17.5-3.13-1.6 GM/177ML PO SOLN: 1.0000 | Freq: Once | ORAL | 0 refills | Status: AC

## 2024-04-14 NOTE — Patient Instructions (Addendum)
 Continue Omeprazole  40 mg twice daily for 2 mths (June 4th through August 4th)  Then daily  No NSAIDs (ibuprofen, motrin, aleve)  Your provider has requested that you go to the basement level for lab work before leaving today. Press B on the elevator. The lab is located at the first door on the left as you exit the elevator.  You have been scheduled for an endoscopy and colonoscopy. Please follow the written instructions given to you at your visit today.  If you use inhalers (even only as needed), please bring them with you on the day of your procedure.  DO NOT TAKE 7 DAYS PRIOR TO TEST- Trulicity (dulaglutide) Ozempic, Wegovy (semaglutide) Mounjaro (tirzepatide) Bydureon Bcise (exanatide extended release)  DO NOT TAKE 1 DAY PRIOR TO YOUR TEST Rybelsus (semaglutide) Adlyxin (lixisenatide) Victoza (liraglutide) Byetta (exanatide) ___________________________________________________________________________ Due to recent changes in healthcare laws, you may see the results of your imaging and laboratory studies on MyChart before your provider has had a chance to review them.  We understand that in some cases there may be results that are confusing or concerning to you. Not all laboratory results come back in the same time frame and the provider may be waiting for multiple results in order to interpret others.  Please give us  48 hours in order for your provider to thoroughly review all the results before contacting the office for clarification of your results.   Thank you for trusting me with your gastrointestinal care. Deanna May, RNP

## 2024-04-15 ENCOUNTER — Ambulatory Visit: Payer: Self-pay | Admitting: Gastroenterology

## 2024-04-26 NOTE — Telephone Encounter (Signed)
**Note De-Identified Brien Lowe Obfuscation** I started a Itamar-HST PA through the [employer] provider portal and it is currently pending review. Outpatient Authorization [id number]

## 2024-04-26 NOTE — Telephone Encounter (Signed)
   Pt is calling to follow up, he would like to check when he can start sleep study

## 2024-04-28 ENCOUNTER — Encounter: Payer: Self-pay | Admitting: Cardiovascular Disease

## 2024-05-10 ENCOUNTER — Encounter (INDEPENDENT_AMBULATORY_CARE_PROVIDER_SITE_OTHER): Payer: Self-pay | Admitting: Cardiology

## 2024-05-10 DIAGNOSIS — G4733 Obstructive sleep apnea (adult) (pediatric): Secondary | ICD-10-CM

## 2024-05-10 NOTE — Telephone Encounter (Signed)
**Note De-Identified Edgar Corrigan Obfuscation** Ordering provider: Glendia Ferrier, PA-c Associated diagnoses: Snoring-R06.83 and CAD-I25.10  WatchPAT PA obtained on 05/10/2024 by Nihar Klus, Avelina HERO, LPN. Authorization: Per approval letter in the [employer] provider portal, this Itamar-HST PA has been approved from 04/26/2024 until 07/25/2024. Outpatient Authorization [id number]  Patient notified of PIN (1234) on 05/10/2024 Iven Earnhart Notification Method: phone.  Phone note routed to covering staff for follow-up.

## 2024-05-11 ENCOUNTER — Ambulatory Visit: Attending: Physician Assistant

## 2024-05-11 DIAGNOSIS — R0683 Snoring: Secondary | ICD-10-CM

## 2024-05-11 DIAGNOSIS — I441 Atrioventricular block, second degree: Secondary | ICD-10-CM

## 2024-05-11 DIAGNOSIS — E7800 Pure hypercholesterolemia, unspecified: Secondary | ICD-10-CM

## 2024-05-11 DIAGNOSIS — I2510 Atherosclerotic heart disease of native coronary artery without angina pectoris: Secondary | ICD-10-CM

## 2024-05-11 DIAGNOSIS — K922 Gastrointestinal hemorrhage, unspecified: Secondary | ICD-10-CM

## 2024-05-11 NOTE — Procedures (Signed)
   SLEEP STUDY REPORT Patient Information Study Date: 05/10/2024 Patient Name: Tyler Ortega Patient ID: [id number] Birth Date: December 22, 1940 Age: 83 Gender: Male BMI: 26.1 (W=172 lb, H=5' 8'') Stopbang: 5 Referring Physician: Glendia Ferrier, PA  TEST DESCRIPTION: Home sleep apnea testing was completed using the WatchPat, a Type 1 device, utilizing  peripheral arterial tonometry (PAT), chest movement, actigraphy, pulse oximetry, pulse rate, body position and snore.  AHI was calculated with apnea and hypopnea using valid sleep time as the denominator. RDI includes apneas,  hypopneas, and RERAs. The data acquired and the scoring of sleep and all associated events were performed in  accordance with the recommended standards and specifications as outlined in the AASM Manual for the Scoring of  Sleep and Associated Events 2.2.0 (2015).   FINDINGS:   1. Mild Obstructive Sleep Apnea with AHI 5.1/hr. All events occurred in the supine position.  2. No Central Sleep Apnea with pAHIc 0.5/hr.   3. Oxygen desaturations as low as 86%.   4. Moderate snoring was present. O2 sats were < 88% for 0.5 min.   5. Total sleep time was 7 hrs and 36 min.   6. 22.9% of total sleep time was spent in REM sleep.   7. Normal sleep onset latency at 16 min.   8. Shortened REM sleep onset latency at 40 min.   9. Total awakenings were 2.  10. Arrhythmia detection: None  DIAGNOSIS: Mild Obstructive Sleep Apnea (G47.33)  RECOMMENDATIONS: 1. Clinical correlation of these findings is necessary. The decision to treat obstructive sleep apnea (OSA) is usually  based on the presence of apnea symptoms or the presence of associated medical conditions such as Hypertension,  Congestive Heart Failure, Atrial Fibrillation or Obesity. The most common symptoms of OSA are snoring, gasping for  breath while sleeping, daytime sleepiness and fatigue.  2. Initiating apnea therapy is recommended given the presence of symptoms and/or  associated conditions.  Recommend proceeding with one of the following:  a. Auto-CPAP therapy with a pressure range of 5-20cm H2O.  b. An oral appliance (OA) that can be obtained from certain dentists with expertise in sleep medicine. These are  primarily of use in non-obese patients with mild and moderate disease.  c. An ENT consultation which may be useful to look for specific causes of obstruction and possible treatment  options.  d. If patient is intolerant to PAP therapy, consider referral to ENT for evaluation for hypoglossal nerve stimulator.  3. Close follow-up is necessary to ensure success with CPAP or oral appliance therapy for maximum benefit . 4. A follow-up oximetry study on CPAP is recommended to assess the adequacy of therapy and determine the need  for supplemental oxygen or the potential need for Bi-level therapy. An arterial blood gas to determine the adequacy of  baseline ventilation and oxygenation should also be considered. 5. Healthy sleep recommendations include: adequate nightly sleep (normal 7-9 hrs/night), avoidance of caffeine after  noon and alcohol near bedtime, and maintaining a sleep environment that is cool, dark and quiet. 6. Weight loss for overweight patients is recommended. Even modest amounts of weight loss can significantly  improve the severity of sleep apnea. 7. Snoring recommendations include: weight loss where appropriate, side sleeping, and avoidance of alcohol before  bed. 8. Operation of motor vehicle should be avoided when sleepy.  Signature: Wilbert Bihari, MD; [facility]; Diplomat, American Board of Sleep  Medicine Electronically Signed: 05/11/2024 11:41:11 A

## 2024-05-13 ENCOUNTER — Telehealth: Payer: Self-pay | Admitting: *Deleted

## 2024-05-13 NOTE — Telephone Encounter (Signed)
-----   Message from Tyler Ortega sent at 05/11/2024 11:42 AM EDT ----- Very minimal OSA only occurs when sleeping supine>>encourage patient to avoid sleeping on his back.  No CPAP recommended at this time

## 2024-05-13 NOTE — Telephone Encounter (Signed)
 The patient has been notified of the result Per DPR by leaving a voicemail on his phone.[email or web address] if he has questions. Joshua Dalton Seip, CMA 05/13/2024 4:24 PM

## 2024-05-20 ENCOUNTER — Encounter: Payer: Self-pay | Admitting: Physician Assistant

## 2024-05-20 ENCOUNTER — Ambulatory Visit: Payer: Self-pay | Admitting: Physician Assistant

## 2024-05-20 DIAGNOSIS — G4733 Obstructive sleep apnea (adult) (pediatric): Secondary | ICD-10-CM | POA: Insufficient documentation

## 2024-05-25 ENCOUNTER — Encounter: Payer: Self-pay | Admitting: Internal Medicine

## 2024-06-02 ENCOUNTER — Other Ambulatory Visit (INDEPENDENT_AMBULATORY_CARE_PROVIDER_SITE_OTHER)

## 2024-06-02 ENCOUNTER — Ambulatory Visit: Payer: Self-pay | Admitting: Internal Medicine

## 2024-06-02 ENCOUNTER — Encounter: Payer: Self-pay | Admitting: Internal Medicine

## 2024-06-02 ENCOUNTER — Ambulatory Visit: Admitting: Internal Medicine

## 2024-06-02 VITALS — BP 149/72 | HR 48 | Temp 98.2°F | Resp 17

## 2024-06-02 DIAGNOSIS — K648 Other hemorrhoids: Secondary | ICD-10-CM | POA: Diagnosis not present

## 2024-06-02 DIAGNOSIS — K562 Volvulus: Secondary | ICD-10-CM

## 2024-06-02 DIAGNOSIS — K254 Chronic or unspecified gastric ulcer with hemorrhage: Secondary | ICD-10-CM

## 2024-06-02 DIAGNOSIS — I10 Essential (primary) hypertension: Secondary | ICD-10-CM | POA: Diagnosis not present

## 2024-06-02 DIAGNOSIS — I2510 Atherosclerotic heart disease of native coronary artery without angina pectoris: Secondary | ICD-10-CM | POA: Diagnosis not present

## 2024-06-02 DIAGNOSIS — K3189 Other diseases of stomach and duodenum: Secondary | ICD-10-CM | POA: Diagnosis not present

## 2024-06-02 DIAGNOSIS — R194 Change in bowel habit: Secondary | ICD-10-CM | POA: Diagnosis not present

## 2024-06-02 DIAGNOSIS — G4733 Obstructive sleep apnea (adult) (pediatric): Secondary | ICD-10-CM | POA: Diagnosis not present

## 2024-06-02 DIAGNOSIS — K449 Diaphragmatic hernia without obstruction or gangrene: Secondary | ICD-10-CM

## 2024-06-02 LAB — CBC
HCT: 42.7 % (ref 39.0–52.0)
Hemoglobin: 14 g/dL (ref 13.0–17.0)
MCHC: 32.8 g/dL (ref 30.0–36.0)
MCV: 99.5 fl (ref 78.0–100.0)
Platelets: 159 K/uL (ref 150.0–400.0)
RBC: 4.28 Mil/uL (ref 4.22–5.81)
RDW: 14.1 % (ref 11.5–15.5)
WBC: 5.6 K/uL (ref 4.0–10.5)

## 2024-06-02 MED ORDER — SODIUM CHLORIDE 0.9 % IV SOLN: 500.0000 mL | Freq: Once | INTRAVENOUS | Status: DC

## 2024-06-02 NOTE — Op Note (Signed)
 [facility] Patient Name: Tyler Ortega Procedure Date: 06/02/2024 1:42 PM MRN: [id number] Endoscopist: Lupita FORBES Commander , MD, [id number] Age: 83 Referring MD:  Date of Birth: 02-Aug-1941 Gender: Male Account #: [id number] Procedure:                Upper GI endoscopy Indications:              Follow-up of acute peptic ulcer with hemorrhage Medicines:                Monitored Anesthesia Care Procedure:                Pre-Anesthesia Assessment:                           - Prior to the procedure, a History and Physical                            was performed, and patient medications and                            allergies were reviewed. The patient's tolerance of                            previous anesthesia was also reviewed. The risks                            and benefits of the procedure and the sedation                            options and risks were discussed with the patient.                            All questions were answered, and informed consent                            was obtained. Prior Anticoagulants: The patient has                            taken no anticoagulant or antiplatelet agents. ASA                            Grade Assessment: III - A patient with severe                            systemic disease. After reviewing the risks and                            benefits, the patient was deemed in satisfactory                            condition to undergo the procedure.                           After obtaining informed consent, the endoscope was  passed under direct vision. Throughout the                            procedure, the patient's blood pressure, pulse, and                            oxygen saturations were monitored continuously. The                            Olympus scope [id number] was introduced through the                            mouth, and advanced to the second part of duodenum.                             The upper GI endoscopy was accomplished without                            difficulty. The patient tolerated the procedure                            well. Scope In: Scope Out: Findings:                 A small hiatal hernia was present.                           The gastroesophageal flap valve was visualized                            endoscopically and classified as Hill Grade IV (no                            fold, wide open lumen, hiatal hernia present).                           Patchy mildly erythematous mucosa was found in the                            prepyloric region of the stomach.                           The exam was otherwise without abnormality.                           The cardia and gastric fundus were otherwise normal                            on retroflexion. Complications:            No immediate complications. Estimated Blood Loss:     Estimated blood loss: none. Impression:               - Small hiatal hernia.                           -  Gastroesophageal flap valve classified as Hill                            Grade IV (no fold, wide open lumen, hiatal hernia                            present).                           - Erythematous mucosa in the prepyloric region of                            the stomach.                           - The examination was otherwise normal.                           - No specimens collected. (previous biopsies                            negative for H pylori) Recommendation:           - Patient has a contact number available for                            emergencies. The signs and symptoms of potential                            delayed complications were discussed with the                            patient. Return to normal activities tomorrow.                            Written discharge instructions were provided to the                            patient.                           - Resume previous diet.                            - Continue present medications.                           - See the other procedure note for documentation of                            additional recommendations.                           - CBC today to f/u anemia                           Stay on iron and daily PPI Tyler Ortega  Avram, MD 06/02/2024 2:25:54 PM This report has been signed electronically.

## 2024-06-02 NOTE — Op Note (Signed)
 [facility] Patient Name: Tyler Ortega Procedure Date: 06/02/2024 1:41 PM MRN: [id number] Endoscopist: Lupita FORBES Commander , MD, [id number] Age: 83 Referring MD:  Date of Birth: 20-Apr-1941 Gender: Male Account #: [id number] Procedure:                Colonoscopy Indications:              Change in bowel habits Medicines:                Monitored Anesthesia Care Procedure:                Pre-Anesthesia Assessment:                           - Prior to the procedure, a History and Physical                            was performed, and patient medications and                            allergies were reviewed. The patient's tolerance of                            previous anesthesia was also reviewed. The risks                            and benefits of the procedure and the sedation                            options and risks were discussed with the patient.                            All questions were answered, and informed consent                            was obtained. Prior Anticoagulants: The patient has                            taken no anticoagulant or antiplatelet agents. ASA                            Grade Assessment: III - A patient with severe                            systemic disease. After reviewing the risks and                            benefits, the patient was deemed in satisfactory                            condition to undergo the procedure.                           After obtaining informed consent, the colonoscope  was passed under direct vision. Throughout the                            procedure, the patient's blood pressure, pulse, and                            oxygen saturations were monitored continuously. The                            Olympus Scope [id number] was introduced through the                            anus and advanced to the the cecum, identified by                            appendiceal orifice and  ileocecal valve. The                            colonoscopy was somewhat difficult due to                            significant looping. Successful completion of the                            procedure was aided by straightening and shortening                            the scope to obtain bowel loop reduction and                            applying abdominal pressure. The patient tolerated                            the procedure well. The quality of the bowel                            preparation was good. The ileocecal valve,                            appendiceal orifice, and rectum were photographed. Scope In: 2:01:12 PM Scope Out: 2:17:43 PM Scope Withdrawal Time: 0 hours 7 minutes 45 seconds  Total Procedure Duration: 0 hours 16 minutes 31 seconds  Findings:                 The perianal and digital rectal examinations were                            normal.                           Internal hemorrhoids were found. The hemorrhoids                            were small.  The exam was otherwise without abnormality on                            direct and retroflexion views. Complications:            No immediate complications. Estimated Blood Loss:     Estimated blood loss: none. Impression:               - Internal hemorrhoids.                           - The examination was otherwise normal on direct                            and retroflexion views.                           - No specimens collected. Recommendation:           - Patient has a contact number available for                            emergencies. The signs and symptoms of potential                            delayed complications were discussed with the                            patient. Return to normal activities tomorrow.                            Written discharge instructions were provided to the                            patient.                           - Resume previous  diet.                           - Continue present medications.                           - No repeat colonoscopy due to age and the absence                            of colonic polyps.                           - Continue fiber supplement Lupita FORBES Commander, MD 06/02/2024 2:28:51 PM This report has been signed electronically.

## 2024-06-02 NOTE — Progress Notes (Signed)
 Sedate, gd SR, tolerated procedure well, VSS, report to RN

## 2024-06-02 NOTE — Progress Notes (Signed)
 Pt's states no medical or surgical changes since previsit or office visit.

## 2024-06-02 NOTE — Progress Notes (Signed)
 [facility] History and Physical   Primary Care Physician:  Amon Aloysius BRAVO, MD   Reason for Procedure:   F/u gastric ulcer and altered bowel habits  Plan:    EGD, colonoscopy     HPI: Tyler Ortega is a 83 y.o. male w/ hx of GI bleed and gastric ulcer 03/2024 and altered bowel habits. Seen in clinic 7/9 and procedures scheduled.   Past Medical History:  Diagnosis Date   Arthritis    osteoarthritis -hips, back pain tx with Prednisone -last dose 09-04-15.   CAD (coronary artery disease)    MI 2011, seen @ [facility], sees cards regulalry (once a year)   Hyperlipidemia    Hypertension    Skin cancer    R calf and the ear (?side)   Transfusion history    2nd Hip surgery   Very Mild OSA (no CPAP recommended) 05/20/2024   Home Sleep Study 05/2024: mild OSA - no CPAP >> rec pt avoid sleeping on back only    Past Surgical History:  Procedure Laterality Date   BUBBLE STUDY  11/18/2019   Procedure: BUBBLE STUDY;  Surgeon: Jeffrie Oneil BROCKS, MD;  Location: [facility] ENDOSCOPY;  Service: Cardiovascular;;   COLONOSCOPY  05/29/11   Normal   CORONARY ARTERY BYPASS GRAFT     05-2010-x4 vessel [facility]   ESOPHAGOGASTRODUODENOSCOPY N/A 03/10/2024   Procedure: EGD (ESOPHAGOGASTRODUODENOSCOPY);  Surgeon: Suzann Inocente HERO, MD;  Location: [facility] ENDOSCOPY;  Service: Gastroenterology;  Laterality: N/A;   EYE SURGERY Bilateral 2019   cataract   HIP SURGERY Left    x3 - L hip (initial hip replacement bleed)   INGUINAL HERNIA REPAIR Left    PATENT FORAMEN OVALE(PFO) CLOSURE N/A 12/03/2019   Procedure: PATENT FORAMEN OVALE (PFO) CLOSURE;  Surgeon: Wonda Sharper, MD;  Location: [facility] INVASIVE CV LAB;  Service: Cardiovascular;  Laterality: N/A;   TEE WITHOUT CARDIOVERSION N/A 11/18/2019   Procedure: TRANSESOPHAGEAL ECHOCARDIOGRAM (TEE);  Surgeon: Jeffrie Oneil BROCKS, MD;  Location: [facility] ENDOSCOPY;  Service: Cardiovascular;  Laterality: N/A;   TOTAL HIP ARTHROPLASTY Right 09/12/2015   Procedure: RIGHT TOTAL  HIP ARTHROPLASTY ANTERIOR APPROACH;  Surgeon: Donnice Car, MD;  Location: [facility] ORS;  Service: Orthopedics;  Laterality: Right;   VASECTOMY       Current Outpatient Medications  Medication Sig Dispense Refill   aspirin  EC 81 MG tablet Take 81 mg by mouth daily. Swallow whole.     atorvastatin  (LIPITOR) 80 MG tablet Take 1 tablet (80 mg total) by mouth daily. 90 tablet 1   Cyanocobalamin  (VITAMIN B-12 PO) Take 1 tablet by mouth daily.     ferrous fumarate  (HEMOCYTE - 106 MG FE) 325 (106 Fe) MG TABS tablet Take 1 tablet (106 mg of iron total) by mouth daily. 90 tablet 1   Multiple Vitamins-Minerals (MULTIVITAMIN MEN 50+) TABS Take 1 tablet by mouth daily.     omeprazole  (PRILOSEC) 40 MG capsule Take 1 capsule (40 mg total) by mouth 2 (two) times daily. 120 capsule 0   amoxicillin  (AMOXIL ) 500 MG capsule Take 2,000 mg by mouth daily as needed (dental work). Dental visits     nitroGLYCERIN  (NITROSTAT ) 0.4 MG SL tablet Place 1 tablet (0.4 mg total) under the tongue every 5 (five) minutes x 3 doses as needed for chest pain. 25 tablet 0   Current Facility-Administered Medications  Medication Dose Route Frequency Provider Last Rate Last Admin   0.9 %  sodium chloride  infusion  500 mL Intravenous Once Avram Lupita BRAVO, MD  Facility-Administered Medications Ordered in Other Visits  Medication Dose Route Frequency Provider Last Rate Last Admin   sodium chloride  flush (NS) 0.9 % injection 10 mL  10 mL Intravenous PRN Sebastian Lamarr SAUNDERS, PA-C   10 mL at 11/30/20 1324    Allergies as of 06/02/2024   (No Known Allergies)    Family History  Problem Relation Age of Onset   Other Mother 63       old age   Other Father 92       tumor on neck   Coronary artery disease Neg Hx    Diabetes Neg Hx    Colon cancer Neg Hx    Prostate cancer Neg Hx    Seizures Neg Hx    Multiple sclerosis Neg Hx    Migraines Neg Hx    Dementia Neg Hx    Rectal cancer Neg Hx    Stomach cancer Neg Hx      Social History   Socioeconomic History   Marital status: Married    Spouse name: Rock   Number of children: 3   Years of education: Some college   Highest education level: Not on file  Occupational History   Occupation: Psychologist, sport and exercise, Retired    Associate Professor: OTHER  Tobacco Use   Smoking status: Never   Smokeless tobacco: Never  Vaping Use   Vaping status: Never Used  Substance and Sexual Activity   Alcohol use: Yes    Comment: rarely 1 glass of wine every 6 mths., last 6 months ago   Drug use: No   Sexual activity: Not on file  Other Topics Concern   Not on file  Social History Narrative   3 children from previous marriage, lives w/ wife,   Still working 3 days per week @ [employer]      Caffeine use: 1-2 cups coffee per day   Soda: diet pepsi a few times a week   Right handed   Social Drivers of Corporate investment banker Strain: Low Risk  (07/22/2023)   Overall Financial Resource Strain (CARDIA)    Difficulty of Paying Living Expenses: Not hard at all  Food Insecurity: No Food Insecurity (03/10/2024)   Hunger Vital Sign    Worried About Running Out of Food in the Last Year: Never true    Ran Out of Food in the Last Year: Never true  Transportation Needs: No Transportation Needs (03/10/2024)   PRAPARE - Administrator, Civil Service (Medical): No    Lack of Transportation (Non-Medical): No  Physical Activity: Sufficiently Active (07/22/2023)   Exercise Vital Sign    Days of Exercise per Week: 6 days    Minutes of Exercise per Session: 40 min  Stress: No Stress Concern Present (07/22/2023)   [employer] of Occupational Health - Occupational Stress Questionnaire    Feeling of Stress : Not at all  Social Connections: Socially Integrated (03/10/2024)   Social Connection and Isolation Panel    Frequency of Communication with Friends and Family: More than three times a week    Frequency of Social Gatherings with Friends and Family: More than three  times a week    Attends Religious Services: More than 4 times per year    Active Member of [employer] or Organizations: Yes    Attends Banker Meetings: More than 4 times per year    Marital Status: Married  Catering manager Violence: Not At Risk (03/10/2024)   Humiliation, Afraid, Rape, and  Kick questionnaire    Fear of Current or Ex-Partner: No    Emotionally Abused: No    Physically Abused: No    Sexually Abused: No    Review of Systems:  All other review of systems negative except as mentioned in the HPI.  Physical Exam: Vital signs BP 136/73   Pulse 62   Temp 98.2 F (36.8 C)   SpO2 98%   General:   Alert,  Well-developed, well-nourished, pleasant and cooperative in NAD Lungs:  Clear throughout to auscultation.   Heart:  Regular rate and rhythm; no murmurs, clicks, rubs,  or gallops. Abdomen:  Soft, nontender and nondistended. Normal bowel sounds.   Neuro/Psych:  Alert and cooperative. Normal mood and affect. A and O x 3   @Sobia Karger  CHARLENA Commander, MD, [facility] [phone] (pager) 06/02/2024 1:48 PM@

## 2024-06-02 NOTE — Patient Instructions (Addendum)
 The stomach ulcer is gone. All healed up. There was small hiatal hernia - not a significant issue.  Colonoscopy revealed small internal hemorrhoids - all else ok.  Continue fiber supplements.  Will check blood count today.  I appreciate the opportunity to care for you. Lupita CHARLENA Commander, MD, FACG   YOU HAD AN ENDOSCOPIC PROCEDURE TODAY AT THE [facility]:   Refer to the procedure report that was given to you for any specific questions about what was found during the examination.  If the procedure report does not answer your questions, please call your gastroenterologist to clarify.  If you requested that your care partner not be given the details of your procedure findings, then the procedure report has been included in a sealed envelope for you to review at your convenience later.  YOU SHOULD EXPECT: Some feelings of bloating in the abdomen. Passage of more gas than usual.  Walking can help get rid of the air that was put into your GI tract during the procedure and reduce the bloating. If you had a lower endoscopy (such as a colonoscopy or flexible sigmoidoscopy) you may notice spotting of blood in your stool or on the toilet paper. If you underwent a bowel prep for your procedure, you may not have a normal bowel movement for a few days.  Please Note:  You might notice some irritation and congestion in your nose or some drainage.  This is from the oxygen used during your procedure.  There is no need for concern and it should clear up in a day or so.  SYMPTOMS TO REPORT IMMEDIATELY:  Following lower endoscopy (colonoscopy or flexible sigmoidoscopy):  Excessive amounts of blood in the stool  Significant tenderness or worsening of abdominal pains  Swelling of the abdomen that is new, acute  Fever of 100F or higher  Following upper endoscopy (EGD)  Vomiting of blood or coffee ground material  New chest pain or pain under the shoulder blades  Painful or persistently difficult  swallowing  New shortness of breath  Fever of 100F or higher  Black, tarry-looking stools  For urgent or emergent issues, a gastroenterologist can be reached at any hour by calling [phone]. Do not use MyChart messaging for urgent concerns.    DIET:  We do recommend a small meal at first, but then you may proceed to your regular diet.  Drink plenty of fluids but you should avoid alcoholic beverages for 24 hours.  ACTIVITY:  You should plan to take it easy for the rest of today and you should NOT DRIVE or use heavy machinery until tomorrow (because of the sedation medicines used during the test).    FOLLOW UP: Our staff will call the number listed on your records the next business day following your procedure.  We will call around 7:15- 8:00 am to check on you and address any questions or concerns that you may have regarding the information given to you following your procedure. If we do not reach you, we will leave a message.     If any biopsies were taken you will be contacted by phone or by letter within the next 1-3 weeks.  Please call us  at [phone] if you have not heard about the biopsies in 3 weeks.    SIGNATURES/CONFIDENTIALITY: You and/or your care partner have signed paperwork which will be entered into your electronic medical record.  These signatures attest to the fact that that the information above on your After Visit  Summary has been reviewed and is understood.  Full responsibility of the confidentiality of this discharge information lies with you and/or your care-partner.

## 2024-06-03 ENCOUNTER — Telehealth: Payer: Self-pay | Admitting: *Deleted

## 2024-06-03 NOTE — Telephone Encounter (Signed)
  Follow up Call-     06/02/2024   12:49 PM  Call back number  Post procedure Call Back phone  # [phone]  Permission to leave phone message Yes     Patient questions:  Do you have a fever, pain , or abdominal swelling? No. Pain Score  0 *  Have you tolerated food without any problems? Yes.    Have you been able to return to your normal activities? Yes.    Do you have any questions about your discharge instructions: Diet   No. Medications  No. Follow up visit  No.  Do you have questions or concerns about your Care? No.  Actions: * If pain score is 4 or above: No action needed, pain <4.

## 2024-06-22 ENCOUNTER — Ambulatory Visit: Admitting: Internal Medicine

## 2024-06-22 ENCOUNTER — Ambulatory Visit: Admitting: Family Medicine

## 2024-06-23 ENCOUNTER — Encounter: Payer: Self-pay | Admitting: Internal Medicine

## 2024-06-23 ENCOUNTER — Ambulatory Visit (INDEPENDENT_AMBULATORY_CARE_PROVIDER_SITE_OTHER): Admitting: Internal Medicine

## 2024-06-23 DIAGNOSIS — Z23 Encounter for immunization: Secondary | ICD-10-CM

## 2024-06-23 DIAGNOSIS — I10 Essential (primary) hypertension: Secondary | ICD-10-CM

## 2024-06-23 DIAGNOSIS — K253 Acute gastric ulcer without hemorrhage or perforation: Secondary | ICD-10-CM

## 2024-06-23 LAB — COMPREHENSIVE METABOLIC PANEL WITH GFR
ALT: 14 U/L (ref 0–53)
AST: 23 U/L (ref 0–37)
Albumin: 4.2 g/dL (ref 3.5–5.2)
Alkaline Phosphatase: 62 U/L (ref 39–117)
BUN: 23 mg/dL (ref 6–23)
CO2: 25 meq/L (ref 19–32)
Calcium: 9.3 mg/dL (ref 8.4–10.5)
Chloride: 107 meq/L (ref 96–112)
Creatinine, Ser: 1.16 mg/dL (ref 0.40–1.50)
GFR: 58.73 mL/min — ABNORMAL LOW (ref >60.00)
Glucose, Bld: 97 mg/dL (ref 70–99)
Potassium: 4.4 meq/L (ref 3.5–5.1)
Sodium: 141 meq/L (ref 135–145)
Total Bilirubin: 0.8 mg/dL (ref 0.2–1.2)
Total Protein: 6.4 g/dL (ref 6.0–8.3)

## 2024-06-23 LAB — CALCIUM, IONIZED: Calcium, Ion: 5.1 mg/dL (ref 4.7–5.5)

## 2024-06-23 NOTE — Patient Instructions (Signed)
 You got a flu shot today  Proceed with a COVID-vaccine at your convenience   GO TO THE LAB :  Get the blood work   Your results will be posted on MyChart with my comments  Go to the front desk for the checkout Please make an appointment for a physical exam by 12/2024

## 2024-06-23 NOTE — Progress Notes (Signed)
 Subjective:    Patient ID: Tyler Ortega, male    DOB: 02/10/1941, 83 y.o.   MRN: [id number]  DOS:  06/23/2024 Type of visit - description: Follow-up  Since the last office visit is doing well. Denies nausea or vomiting.  No diarrhea or blood in the stools.  Review of Systems See above   Past Medical History:  Diagnosis Date   Arthritis    osteoarthritis -hips, back pain tx with Prednisone -last dose 09-04-15.   CAD (coronary artery disease)    MI 2011, seen @ [facility], sees cards regulalry (once a year)   Hyperlipidemia    Hypertension    Skin cancer    R calf and the ear (?side)   Transfusion history    2nd Hip surgery   Very Mild OSA (no CPAP recommended) 05/20/2024   Home Sleep Study 05/2024: mild OSA - no CPAP >> rec pt avoid sleeping on back only    Past Surgical History:  Procedure Laterality Date   BUBBLE STUDY  11/18/2019   Procedure: BUBBLE STUDY;  Surgeon: Jeffrie Oneil BROCKS, MD;  Location: [facility] ENDOSCOPY;  Service: Cardiovascular;;   COLONOSCOPY  05/29/11   Normal   CORONARY ARTERY BYPASS GRAFT     05-2010-x4 vessel [facility]   ESOPHAGOGASTRODUODENOSCOPY N/A 03/10/2024   Procedure: EGD (ESOPHAGOGASTRODUODENOSCOPY);  Surgeon: Suzann Inocente HERO, MD;  Location: [facility] ENDOSCOPY;  Service: Gastroenterology;  Laterality: N/A;   EYE SURGERY Bilateral 2019   cataract   HIP SURGERY Left    x3 - L hip (initial hip replacement bleed)   INGUINAL HERNIA REPAIR Left    PATENT FORAMEN OVALE(PFO) CLOSURE N/A 12/03/2019   Procedure: PATENT FORAMEN OVALE (PFO) CLOSURE;  Surgeon: Wonda Sharper, MD;  Location: [facility] INVASIVE CV LAB;  Service: Cardiovascular;  Laterality: N/A;   TEE WITHOUT CARDIOVERSION N/A 11/18/2019   Procedure: TRANSESOPHAGEAL ECHOCARDIOGRAM (TEE);  Surgeon: Jeffrie Oneil BROCKS, MD;  Location: [facility] ENDOSCOPY;  Service: Cardiovascular;  Laterality: N/A;   TOTAL HIP ARTHROPLASTY Right 09/12/2015   Procedure: RIGHT TOTAL HIP ARTHROPLASTY ANTERIOR APPROACH;  Surgeon: Donnice Car, MD;  Location: [facility] ORS;  Service: Orthopedics;  Laterality: Right;   VASECTOMY      Current Outpatient Medications  Medication Instructions   amoxicillin  (AMOXIL ) 2,000 mg, Daily PRN   aspirin  EC 81 mg, Daily   atorvastatin  (LIPITOR) 80 mg, Oral, Daily   Cyanocobalamin  (VITAMIN B-12 PO) 1 tablet, Daily   Multiple Vitamins-Minerals (MULTIVITAMIN MEN 50+) TABS 1 tablet, Daily   nitroGLYCERIN  (NITROSTAT ) 0.4 mg, Sublingual, Every 5 min x3 PRN   omeprazole  (PRILOSEC) 40 mg, Oral, 2 times daily       Objective:   Physical Exam BP 136/68   Pulse (!) 56   Temp 97.9 F (36.6 C) (Oral)   Resp 16   Ht 5' 8 (1.727 m)   Wt 170 lb 4 oz (77.2 kg)   SpO2 97%   BMI 25.89 kg/m  General:   Well developed, NAD, BMI noted. HEENT:  Normocephalic . Face symmetric, atraumatic Lungs:  CTA B Normal respiratory effort, no intercostal retractions, no accessory muscle use. Heart: Bradycardic, no murmur.  Lower extremities: no pretibial edema bilaterally  Skin: Not pale. Not jaundice Neurologic:  alert & oriented X3.  Speech normal, gait appropriate for age and unassisted Psych--  Cognition and judgment appear intact.  Cooperative with normal attention span and concentration.  Behavior appropriate. No anxious or depressed appearing.      Assessment     Assessment Hyperglycemia  A1c 6.45/2017 Hyperlipidemia CAD: Dr. Smith --MI and CABG 2011 --DOE> admitted>  cath 07-2017, had a stent --Transient amnesia triggered a w/u: *MRI: strokes noted *ECHO --PFO closure (11/2019) , dx after a stroke   *Carotid artery dz, see CTA neck 09/2019 GI bleeding: Hg dropped to 4.8 after cath 07/2017, + hematemesis  EGD EG junction ulceration endoscopy  >>clipped Transient amnesia: saw neuro.  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 (likely chronic, unrelated finding per neurology); EEG (-) B12 def dx 02-2106 ABX  pre dental work request by ortho d/t hip surgeries  Sleep study 05/2024: Very mild OSA, no CPAP indicated  PLAN: Acute anemia, gastric ulcer: See LOV, iron was slightly low, hemoglobin stable.  Was Rx iron tablets. On 06/02/2024: - EGD: Essentially negative, see results, no biopsies made. - Colonoscopy: No polyps. Last CBC very good, about to stop taking iron supplements Hypocalcemia: Per chart review, check CMP and ionized calcium . Flu shot today Plans to get a COVID-vaccine at the pharmacy. RTC CPX 6 months

## 2024-06-23 NOTE — Assessment & Plan Note (Signed)
 Acute anemia, gastric ulcer: See LOV, iron was slightly low, hemoglobin stable.  Was Rx iron tablets. On 06/02/2024: - EGD: Essentially negative, see results, no biopsies made. - Colonoscopy: No polyps. Last CBC very good, about to stop taking iron supplements Hypocalcemia: Per chart review, check CMP and ionized calcium . Flu shot today Plans to get a COVID-vaccine at the pharmacy. RTC CPX 6 months

## 2024-06-24 ENCOUNTER — Ambulatory Visit: Payer: Self-pay | Admitting: Internal Medicine

## 2024-06-29 ENCOUNTER — Other Ambulatory Visit: Payer: Self-pay | Admitting: Gastroenterology

## 2024-06-29 DIAGNOSIS — K254 Chronic or unspecified gastric ulcer with hemorrhage: Secondary | ICD-10-CM

## 2024-07-28 ENCOUNTER — Ambulatory Visit (INDEPENDENT_AMBULATORY_CARE_PROVIDER_SITE_OTHER): Payer: Medicare Other | Admitting: *Deleted

## 2024-07-28 VITALS — BP 150/79 | HR 55 | Ht 68.0 in | Wt 170.0 lb

## 2024-07-28 DIAGNOSIS — Z0000 Encounter for general adult medical examination without abnormal findings: Secondary | ICD-10-CM

## 2024-07-28 NOTE — Progress Notes (Signed)
 Subjective:   Tyler Ortega is a 83 y.o. who presents for a Medicare Wellness preventive visit.  As a reminder, Annual Wellness Visits don't include a physical exam, and some assessments may be limited, especially if this visit is performed virtually. We may recommend an in-person follow-up visit with your provider if needed.  Visit Complete: Virtual I connected with  Tyler Ortega on 07/28/24 by a video and audio enabled telemedicine application and verified that I am speaking with the correct person using two identifiers.  Patient Location: Home  Provider Location: Office/Clinic  I discussed the limitations of evaluation and management by telemedicine. The patient expressed understanding and agreed to proceed.  Vital Signs: Because this visit was a virtual/telehealth visit, some criteria may be missing or patient reported. Any vitals not documented were not able to be obtained and vitals that have been documented are patient reported.   Persons Participating in Visit: Patient.  AWV Questionnaire: Yes: Patient Medicare AWV questionnaire was completed by the patient on 07/21/21; I have confirmed that all information answered by patient is correct and no changes since this date.  Cardiac Risk Factors include: advanced age (>34men, >93 women);dyslipidemia;male gender;hypertension;Other (see comment), Risk factor comments: CAD     Objective:    Today's Vitals   07/28/24 1303  Weight: 170 lb (77.1 kg)  Height: 5' 8 (1.727 m)   Body mass index is 25.85 kg/m.     07/28/2024    1:13 PM 03/10/2024    3:09 PM 03/08/2024    7:03 PM 03/06/2024   11:49 AM 07/23/2023    1:38 PM 05/15/2022    9:04 AM 05/02/2021    8:23 AM  Advanced Directives  Does Patient Have a Medical Advance Directive? Yes  No No Yes Yes Yes  Type of Advance Directive Living will;Healthcare Power of [place] Power of [place];Living will Healthcare Power of [place];Living will Healthcare Power of  [place];Living will  Does patient want to make changes to medical advance directive? No - Patient declined    No - Patient declined No - Patient declined   Copy of Healthcare Power of Attorney in Chart? Yes - validated most recent copy scanned in chart (See row information)    Yes - validated most recent copy scanned in chart (See row information) Yes - validated most recent copy scanned in chart (See row information) Yes - validated most recent copy scanned in chart (See row information)  Would patient like information on creating a medical advance directive?  No - Patient declined         Current Medications (verified) Outpatient Encounter Medications as of 07/28/2024  Medication Sig   amoxicillin  (AMOXIL ) 500 MG capsule Take 2,000 mg by mouth daily as needed (dental work). Dental visits   aspirin  EC 81 MG tablet Take 81 mg by mouth daily. Swallow whole.   atorvastatin  (LIPITOR) 80 MG tablet Take 1 tablet (80 mg total) by mouth daily.   Cyanocobalamin  (VITAMIN B-12 PO) Take 1 tablet by mouth daily.   Multiple Vitamins-Minerals (MULTIVITAMIN MEN 50+) TABS Take 1 tablet by mouth daily.   nitroGLYCERIN  (NITROSTAT ) 0.4 MG SL tablet Place 1 tablet (0.4 mg total) under the tongue every 5 (five) minutes x 3 doses as needed for chest pain.   omeprazole  (PRILOSEC) 40 MG capsule Take 1 capsule by mouth twice daily   No facility-administered encounter medications on file as of 07/28/2024.    Allergies (verified) Patient has no known allergies.  History: Past Medical History:  Diagnosis Date   Arthritis    osteoarthritis -hips, back pain tx with Prednisone -last dose 09-04-15.   CAD (coronary artery disease)    MI 2011, seen @ [facility], sees cards regulalry (once a year)   Hyperlipidemia    Hypertension    Skin cancer    R calf and the ear (?side)   Transfusion history    2nd Hip surgery   Very Mild OSA (no CPAP recommended) 05/20/2024   Home Sleep Study 05/2024: mild OSA - no CPAP >> rec  pt avoid sleeping on back only   Past Surgical History:  Procedure Laterality Date   BUBBLE STUDY  11/18/2019   Procedure: BUBBLE STUDY;  Surgeon: Jeffrie Oneil BROCKS, MD;  Location: [facility] ENDOSCOPY;  Service: Cardiovascular;;   COLONOSCOPY  05/29/11   Normal   CORONARY ARTERY BYPASS GRAFT     05-2010-x4 vessel [facility]   ESOPHAGOGASTRODUODENOSCOPY N/A 03/10/2024   Procedure: EGD (ESOPHAGOGASTRODUODENOSCOPY);  Surgeon: Suzann Inocente HERO, MD;  Location: [facility] ENDOSCOPY;  Service: Gastroenterology;  Laterality: N/A;   EYE SURGERY Bilateral 2019   cataract   HIP SURGERY Left    x3 - L hip (initial hip replacement bleed)   INGUINAL HERNIA REPAIR Left    PATENT FORAMEN OVALE(PFO) CLOSURE N/A 12/03/2019   Procedure: PATENT FORAMEN OVALE (PFO) CLOSURE;  Surgeon: Wonda Sharper, MD;  Location: [facility] INVASIVE CV LAB;  Service: Cardiovascular;  Laterality: N/A;   TEE WITHOUT CARDIOVERSION N/A 11/18/2019   Procedure: TRANSESOPHAGEAL ECHOCARDIOGRAM (TEE);  Surgeon: Jeffrie Oneil BROCKS, MD;  Location: [facility] ENDOSCOPY;  Service: Cardiovascular;  Laterality: N/A;   TOTAL HIP ARTHROPLASTY Right 09/12/2015   Procedure: RIGHT TOTAL HIP ARTHROPLASTY ANTERIOR APPROACH;  Surgeon: Donnice Car, MD;  Location: [facility] ORS;  Service: Orthopedics;  Laterality: Right;   VASECTOMY     Family History  Problem Relation Age of Onset   Other Mother 20       old age   Other Father 52       tumor on neck   Coronary artery disease Neg Hx    Diabetes Neg Hx    Colon cancer Neg Hx    Prostate cancer Neg Hx    Seizures Neg Hx    Multiple sclerosis Neg Hx    Migraines Neg Hx    Dementia Neg Hx    Rectal cancer Neg Hx    Stomach cancer Neg Hx    Social History   Socioeconomic History   Marital status: Married    Spouse name: Tyler Ortega   Number of children: 3   Years of education: Some college   Highest education level: Not on file  Occupational History   Occupation: Psychologist, sport and exercise, Retired    Associate Professor: OTHER  Tobacco Use    Smoking status: Never   Smokeless tobacco: Never  Vaping Use   Vaping status: Never Used  Substance and Sexual Activity   Alcohol use: Yes    Comment: rarely 1 glass of wine every 6 mths., last 6 months ago   Drug use: No   Sexual activity: Not on file  Other Topics Concern   Not on file  Social History Narrative   3 children from previous marriage, lives w/ wife,   Still working 3 days per week @ [employer]      Caffeine use: 1-2 cups coffee per day   Soda: diet pepsi a few times a week   Right handed   Social Drivers of Health  Financial Resource Strain: Low Risk  (07/22/2023)   Overall Financial Resource Strain (CARDIA)    Difficulty of Paying Living Expenses: Not hard at all  Food Insecurity: No Food Insecurity (07/28/2024)   Hunger Vital Sign    Worried About Running Out of Food in the Last Year: Never true    Ran Out of Food in the Last Year: Never true  Transportation Needs: No Transportation Needs (07/28/2024)   PRAPARE - Administrator, Civil Service (Medical): No    Lack of Transportation (Non-Medical): No  Physical Activity: Sufficiently Active (07/28/2024)   Exercise Vital Sign    Days of Exercise per Week: 6 days    Minutes of Exercise per Session: 40 min  Stress: No Stress Concern Present (07/22/2023)   [employer] of Occupational Health - Occupational Stress Questionnaire    Feeling of Stress : Not at all  Social Connections: Socially Integrated (07/28/2024)   Social Connection and Isolation Panel    Frequency of Communication with Friends and Family: More than three times a week    Frequency of Social Gatherings with Friends and Family: More than three times a week    Attends Religious Services: More than 4 times per year    Active Member of [employer] or Organizations: Yes    Attends Engineer, structural: More than 4 times per year    Marital Status: Married    Tobacco Counseling Counseling given: Not  Answered    Clinical Intake:  Pre-visit preparation completed: Yes  Pain : No/denies pain     BMI - recorded: 25.85 Nutritional Status: BMI 25 -29 Overweight Nutritional Risks: None Diabetes: No  Lab Results  Component Value Date   HGBA1C 6.2 12/23/2023   HGBA1C 6.2 09/18/2022   HGBA1C 6.0 05/01/2021     How often do you need to have someone help you when you read instructions, pamphlets, or other written materials from your doctor or pharmacy?: 1 - Never What is the last grade level you completed in school?: some college  Interpreter Needed?: No  Information entered by :: Tyler Ortega, CMA(AAMA)   Activities of Daily Living     07/21/2024    1:09 PM 06/23/2024   10:47 AM  In your present state of health, do you have any difficulty performing the following activities:  Hearing? 0 0  Vision? 0 0  Difficulty concentrating or making decisions? 0 0  Walking or climbing stairs? 0 0  Dressing or bathing? 0 0  Doing errands, shopping? 0 0  Preparing Food and eating ? N   Using the Toilet? N   In the past six months, have you accidently leaked urine? N   Do you have problems with loss of bowel control? N   Managing your Medications? N   Managing your Finances? N   Housekeeping or managing your Housekeeping? N     Patient Care Team: Amon Aloysius BRAVO, MD as PCP - Diedre Wonda Sharper, MD as PCP - Cardiology (Cardiology) Lelon Glendia ONEIDA DEVONNA as Physician Assistant (Cardiology)  I have updated your Care Teams any recent Medical Services you may have received from other providers in the past year.     Assessment:   This is a routine wellness examination for Tyler Ortega.  Hearing/Vision screen Hearing Screening - Comments:: Has always had some difficulty with right ear but no hearing aid needed.  Vision Screening - Comments:: Up to date with routine eye exams with [facility] ([address])   Goals  Addressed               This Visit's Progress      Patient Stated (pt-stated)   On track     Maintain current health       Depression Screen     07/28/2024    1:11 PM 06/23/2024   10:47 AM 03/16/2024    3:25 PM 12/23/2023   10:01 AM 07/23/2023    1:35 PM 09/18/2022    9:54 AM 05/15/2022    9:05 AM  PHQ 2/9 Scores  PHQ - 2 Score 0 0 0 0 0 0 0  PHQ- 9 Score 0 0   0      Fall Risk     07/21/2024    1:09 PM 06/23/2024   10:47 AM 03/16/2024    3:25 PM 12/23/2023   10:01 AM 07/22/2023    4:21 PM  Fall Risk   Falls in the past year? 0 0 0 0 0  Number falls in past yr: 0 0 0 0 0  Injury with Fall? 0 0 0 0 0  Risk for fall due to : No Fall Risks    No Fall Risks  Follow up Education provided Falls evaluation completed;Education provided Falls evaluation completed;Education provided Falls evaluation completed;Education provided Falls prevention discussed    MEDICARE RISK AT HOME:  Medicare Risk at Home Any stairs in or around the home?: (Patient-Rptd) Yes If so, are there any without handrails?: (Patient-Rptd) No Home free of loose throw rugs in walkways, pet beds, electrical cords, etc?: (Patient-Rptd) Yes Adequate lighting in your home to reduce risk of falls?: (Patient-Rptd) Yes Life alert?: (Patient-Rptd) No Use of a cane, walker or w/c?: (Patient-Rptd) No Grab bars in the bathroom?: (Patient-Rptd) No Shower chair or bench in shower?: (Patient-Rptd) No Elevated toilet seat or a handicapped toilet?: (Patient-Rptd) Yes  TIMED UP AND GO:  Was the test performed?  No,video/audio  Cognitive Function: 6CIT completed    03/05/2016   11:31 AM  MMSE - Mini Mental State Exam  Orientation to time 4   Orientation to Place 5   Registration 3   Attention/ Calculation 5   Recall 3   Language- name 2 objects 2   Language- repeat 1  Language- follow 3 step command 3   Language- read & follow direction 1   Write a sentence 1   Copy design 1   Total score 29      Data saved with a previous flowsheet row definition         07/28/2024    1:15 PM 07/23/2023    1:39 PM 05/15/2022    9:10 AM  6CIT Screen  What Year? 0 points 0 points 0 points  What month? 0 points 0 points 0 points  What time? 0 points 0 points 0 points  Count back from 20 0 points 0 points 2 points  Months in reverse 0 points 0 points 4 points  Repeat phrase 2 points 0 points 0 points  Total Score 2 points 0 points 6 points    Immunizations Immunization History  Administered Date(s) Administered   Fluad Quad(high Dose 65+) 05/29/2019   INFLUENZA, HIGH DOSE SEASONAL PF 08/01/2015, 09/16/2016, 06/23/2024   Influenza Split 07/01/2020, 05/07/2021, 06/07/2022   Influenza-Unspecified 07/07/2014, 08/01/2017, 07/07/2018   PFIZER(Purple Top)SARS-COV-2 Vaccination 10/18/2019, 11/08/2019, 07/01/2020, 05/03/2021   PNEUMOCOCCAL CONJUGATE-20 09/18/2022   Pfizer(Comirnaty)Fall Seasonal Vaccine 12 years and older 07/23/2023   Pneumococcal Conjugate-13 06/01/2015   Pneumococcal Polysaccharide-23 05/30/2014  Tdap 04/25/2011, 03/14/2018, 01/26/2024   Zoster Recombinant(Shingrix ) 04/26/2019, 07/29/2019    Screening Tests Health Maintenance  Topic Date Due   Medicare Annual Wellness (AWV)  07/22/2024   DTaP/Tdap/Td (4 - Td or Tdap) 01/25/2034   Pneumococcal Vaccine: 50+ Years  Completed   Influenza Vaccine  Completed   Zoster Vaccines- Shingrix   Completed   Meningococcal B Vaccine  Aged Out   COVID-19 Vaccine  Discontinued   Hepatitis C Screening  Discontinued    Health Maintenance Items Addressed: Up to date with all HM  Additional Screening:  Vision Screening: Recommended annual ophthalmology exams for early detection of glaucoma and other disorders of the eye. Is the patient up to date with their annual eye exam?  Yes  Who is the provider or what is the name of the office in which the patient attends annual eye exams? [facility]  Dental Screening: Recommended annual dental exams for proper oral hygiene  Community Resource  Referral / Chronic Care Management: CRR required this visit?  No   CCM required this visit?  No   Plan:    I have personally reviewed and noted the following in the patient's chart:   Medical and social history Use of alcohol, tobacco or illicit drugs  Current medications and supplements including opioid prescriptions. Patient is not currently taking opioid prescriptions. Functional ability and status Nutritional status Physical activity Advanced directives List of other physicians Hospitalizations, surgeries, and ER visits in previous 12 months Vitals Screenings to include cognitive, depression, and falls Referrals and appointments  In addition, I have reviewed and discussed with patient certain preventive protocols, quality metrics, and best practice recommendations. A written personalized care plan for preventive services as well as general preventive health recommendations were provided to patient.   Tyler Ortega, CMA   07/28/2024   After Visit Summary: (MyChart) Due to this being a telephonic visit, the after visit summary with patients personalized plan was offered to patient via MyChart   Notes: Nothing significant to report at this time.

## 2024-07-28 NOTE — Patient Instructions (Addendum)
 Tyler Ortega , Thank you for taking time out of your busy schedule to complete your Annual Wellness Visit with me. I enjoyed our conversation and look forward to speaking with you again next year. I, as well as your care team,  appreciate your ongoing commitment to your health goals. Please review the following plan we discussed and let me know if I can assist you in the future. Your Game plan/ To Do List     Follow up Visits: Next Medicare AWV with our clinical staff: 08/03/25 11am, mychart video    Next Office Visit with your provider: 12/28/24 10am, Dr Amon.  Clinician Recommendations:  Aim for 30 minutes of exercise or brisk walking, 6-8 glasses of water, and 5 servings of fruits and vegetables each day.     This is a list of the screening recommended for you and due dates:  Health Maintenance  Topic Date Due   Medicare Annual Wellness Visit  07/22/2024   DTaP/Tdap/Td vaccine (4 - Td or Tdap) 01/25/2034   Pneumococcal Vaccine for age over 100  Completed   Flu Shot  Completed   Zoster (Shingles) Vaccine  Completed   Meningitis B Vaccine  Aged Out   COVID-19 Vaccine  Discontinued   Hepatitis C Screening  Discontinued    Advanced directives: (In Chart) A copy of your advanced directives are scanned into your chart should your provider ever need it. Advance Care Planning is important because it:  [x]  Makes sure you receive the medical care that is consistent with your values, goals, and preferences  [x]  It provides guidance to your family and loved ones and reduces their decisional burden about whether or not they are making the right decisions based on your wishes.  Follow the link provided in your after visit summary or read over the paperwork we have mailed to you to help you started getting your Advance Directives in place. If you need assistance in completing these, please reach out to us  so that we can help you!  See attachments for Preventive Care and Fall Prevention Tips.

## 2024-07-29 NOTE — Progress Notes (Signed)
 Subjective:   Tyler Ortega is a 83 y.o. who presents for a Medicare Wellness preventive visit.  As a reminder, Annual Wellness Visits don't include a physical exam, and some assessments may be limited, especially if this visit is performed virtually. We may recommend an in-person follow-up visit with your provider if needed.  Visit Complete: Virtual I connected with  Tyler Ortega on 07/28/24 by a video and audio enabled telemedicine application and verified that I am speaking with the correct person using two identifiers.  Patient Location: Home  Provider Location: Office/Clinic  I discussed the limitations of evaluation and management by telemedicine. The patient expressed understanding and agreed to proceed.  Vital Signs: Because this visit was a virtual/telehealth visit, some criteria may be missing or patient reported. Any vitals not documented were not able to be obtained and vitals that have been documented are patient reported.   Persons Participating in Visit: Patient.  AWV Questionnaire: Yes: Patient Medicare AWV questionnaire was completed by the patient on 07/21/24; I have confirmed that all information answered by patient is correct and no changes since this date.  Cardiac Risk Factors include: advanced age (>16men, >41 women);dyslipidemia;male gender;hypertension;Other (see comment), Risk factor comments: CAD     Objective:    Today's Vitals   07/28/24 1303  BP: (!) 150/79  Pulse: (!) 55  Weight: 170 lb (77.1 kg)  Height: 5' 8 (1.727 m)   Body mass index is 25.85 kg/m.     07/28/2024    1:13 PM 03/10/2024    3:09 PM 03/08/2024    7:03 PM 03/06/2024   11:49 AM 07/23/2023    1:38 PM 05/15/2022    9:04 AM 05/02/2021    8:23 AM  Advanced Directives  Does Patient Have a Medical Advance Directive? Yes  No No Yes Yes Yes  Type of Advance Directive Living will;Healthcare Power of [place] Power of [place];Living will Healthcare Power of  [place];Living will Healthcare Power of [place];Living will  Does patient want to make changes to medical advance directive? No - Patient declined    No - Patient declined No - Patient declined   Copy of Healthcare Power of Attorney in Chart? Yes - validated most recent copy scanned in chart (See row information)    Yes - validated most recent copy scanned in chart (See row information) Yes - validated most recent copy scanned in chart (See row information) Yes - validated most recent copy scanned in chart (See row information)  Would patient like information on creating a medical advance directive?  No - Patient declined         Current Medications (verified) Outpatient Encounter Medications as of 07/28/2024  Medication Sig   amoxicillin  (AMOXIL ) 500 MG capsule Take 2,000 mg by mouth daily as needed (dental work). Dental visits   aspirin  EC 81 MG tablet Take 81 mg by mouth daily. Swallow whole.   atorvastatin  (LIPITOR) 80 MG tablet Take 1 tablet (80 mg total) by mouth daily.   Cyanocobalamin  (VITAMIN B-12 PO) Take 1 tablet by mouth daily.   Multiple Vitamins-Minerals (MULTIVITAMIN MEN 50+) TABS Take 1 tablet by mouth daily.   nitroGLYCERIN  (NITROSTAT ) 0.4 MG SL tablet Place 1 tablet (0.4 mg total) under the tongue every 5 (five) minutes x 3 doses as needed for chest pain.   omeprazole  (PRILOSEC) 40 MG capsule Take 1 capsule by mouth twice daily   No facility-administered encounter medications on file as of 07/28/2024.  Allergies (verified) Patient has no known allergies.   History: Past Medical History:  Diagnosis Date   Arthritis    osteoarthritis -hips, back pain tx with Prednisone -last dose 09-04-15.   CAD (coronary artery disease)    MI 2011, seen @ [facility], sees cards regulalry (once a year)   Hyperlipidemia    Hypertension    Skin cancer    R calf and the ear (?side)   Transfusion history    2nd Hip surgery   Very Mild OSA (no CPAP recommended) 05/20/2024   Home Sleep  Study 05/2024: mild OSA - no CPAP >> rec pt avoid sleeping on back only   Past Surgical History:  Procedure Laterality Date   BUBBLE STUDY  11/18/2019   Procedure: BUBBLE STUDY;  Surgeon: Jeffrie Oneil BROCKS, MD;  Location: [facility] ENDOSCOPY;  Service: Cardiovascular;;   COLONOSCOPY  05/29/11   Normal   CORONARY ARTERY BYPASS GRAFT     05-2010-x4 vessel [facility]   ESOPHAGOGASTRODUODENOSCOPY N/A 03/10/2024   Procedure: EGD (ESOPHAGOGASTRODUODENOSCOPY);  Surgeon: Suzann Inocente HERO, MD;  Location: [facility] ENDOSCOPY;  Service: Gastroenterology;  Laterality: N/A;   EYE SURGERY Bilateral 2019   cataract   HIP SURGERY Left    x3 - L hip (initial hip replacement bleed)   INGUINAL HERNIA REPAIR Left    PATENT FORAMEN OVALE(PFO) CLOSURE N/A 12/03/2019   Procedure: PATENT FORAMEN OVALE (PFO) CLOSURE;  Surgeon: Wonda Sharper, MD;  Location: [facility] INVASIVE CV LAB;  Service: Cardiovascular;  Laterality: N/A;   TEE WITHOUT CARDIOVERSION N/A 11/18/2019   Procedure: TRANSESOPHAGEAL ECHOCARDIOGRAM (TEE);  Surgeon: Jeffrie Oneil BROCKS, MD;  Location: [facility] ENDOSCOPY;  Service: Cardiovascular;  Laterality: N/A;   TOTAL HIP ARTHROPLASTY Right 09/12/2015   Procedure: RIGHT TOTAL HIP ARTHROPLASTY ANTERIOR APPROACH;  Surgeon: Donnice Car, MD;  Location: [facility] ORS;  Service: Orthopedics;  Laterality: Right;   VASECTOMY     Family History  Problem Relation Age of Onset   Other Mother 95       old age   Other Father 81       tumor on neck   Coronary artery disease Neg Hx    Diabetes Neg Hx    Colon cancer Neg Hx    Prostate cancer Neg Hx    Seizures Neg Hx    Multiple sclerosis Neg Hx    Migraines Neg Hx    Dementia Neg Hx    Rectal cancer Neg Hx    Stomach cancer Neg Hx    Social History   Socioeconomic History   Marital status: Married    Spouse name: Rock   Number of children: 3   Years of education: Some college   Highest education level: Not on file  Occupational History   Occupation: Psychologist, sport and exercise, Retired     Associate Professor: OTHER  Tobacco Use   Smoking status: Never   Smokeless tobacco: Never  Vaping Use   Vaping status: Never Used  Substance and Sexual Activity   Alcohol use: Yes    Comment: rarely 1 glass of wine every 6 mths., last 6 months ago   Drug use: No   Sexual activity: Not on file  Other Topics Concern   Not on file  Social History Narrative   3 children from previous marriage, lives w/ wife,   Still working 3 days per week @ [employer]      Caffeine use: 1-2 cups coffee per day   Soda: diet pepsi a few times a week   Right  handed   Social Drivers of Corporate investment banker Strain: Low Risk  (07/22/2023)   Overall Financial Resource Strain (CARDIA)    Difficulty of Paying Living Expenses: Not hard at all  Food Insecurity: No Food Insecurity (07/28/2024)   Hunger Vital Sign    Worried About Running Out of Food in the Last Year: Never true    Ran Out of Food in the Last Year: Never true  Transportation Needs: No Transportation Needs (07/28/2024)   PRAPARE - Administrator, Civil Service (Medical): No    Lack of Transportation (Non-Medical): No  Physical Activity: Sufficiently Active (07/28/2024)   Exercise Vital Sign    Days of Exercise per Week: 6 days    Minutes of Exercise per Session: 40 min  Stress: No Stress Concern Present (07/22/2023)   [employer] of Occupational Health - Occupational Stress Questionnaire    Feeling of Stress : Not at all  Social Connections: Socially Integrated (07/28/2024)   Social Connection and Isolation Panel    Frequency of Communication with Friends and Family: More than three times a week    Frequency of Social Gatherings with Friends and Family: More than three times a week    Attends Religious Services: More than 4 times per year    Active Member of [employer] or Organizations: Yes    Attends Engineer, structural: More than 4 times per year    Marital Status: Married    Tobacco Counseling Counseling  given: Not Answered    Clinical Intake:  Pre-visit preparation completed: Yes  Pain : No/denies pain     BMI - recorded: 25.85 Nutritional Status: BMI 25 -29 Overweight Nutritional Risks: None Diabetes: No  Lab Results  Component Value Date   HGBA1C 6.2 12/23/2023   HGBA1C 6.2 09/18/2022   HGBA1C 6.0 05/01/2021     How often do you need to have someone help you when you read instructions, pamphlets, or other written materials from your doctor or pharmacy?: 1 - Never What is the last grade level you completed in school?: some college  Interpreter Needed?: No  Information entered by :: Lolita Libra, CMA(AAMA)   Activities of Daily Living     07/21/2024    1:09 PM 06/23/2024   10:47 AM  In your present state of health, do you have any difficulty performing the following activities:  Hearing? 0 0  Vision? 0 0  Difficulty concentrating or making decisions? 0 0  Walking or climbing stairs? 0 0  Dressing or bathing? 0 0  Doing errands, shopping? 0 0  Preparing Food and eating ? N   Using the Toilet? N   In the past six months, have you accidently leaked urine? N   Do you have problems with loss of bowel control? N   Managing your Medications? N   Managing your Finances? N   Housekeeping or managing your Housekeeping? N     Patient Care Team: Amon Aloysius BRAVO, MD as PCP - Diedre Wonda Sharper, MD as PCP - Cardiology (Cardiology) Lelon Glendia ONEIDA DEVONNA as Physician Assistant (Cardiology)  I have updated your Care Teams any recent Medical Services you may have received from other providers in the past year.     Assessment:   This is a routine wellness examination for Jamall.  Hearing/Vision screen Hearing Screening - Comments:: Has always had some difficulty with right ear but no hearing aid needed.  Vision Screening - Comments:: Up to date with routine eye  exams with [facility] ([address])   Goals Addressed               This Visit's  Progress     Patient Stated (pt-stated)   On track     Maintain current health       Depression Screen     07/28/2024    1:11 PM 06/23/2024   10:47 AM 03/16/2024    3:25 PM 12/23/2023   10:01 AM 07/23/2023    1:35 PM 09/18/2022    9:54 AM 05/15/2022    9:05 AM  PHQ 2/9 Scores  PHQ - 2 Score 0 0 0 0 0 0 0  PHQ- 9 Score 0 0   0      Fall Risk     07/21/2024    1:09 PM 06/23/2024   10:47 AM 03/16/2024    3:25 PM 12/23/2023   10:01 AM 07/22/2023    4:21 PM  Fall Risk   Falls in the past year? 0 0 0 0 0  Number falls in past yr: 0 0 0 0 0  Injury with Fall? 0 0 0 0 0  Risk for fall due to : No Fall Risks    No Fall Risks  Follow up Education provided Falls evaluation completed;Education provided Falls evaluation completed;Education provided Falls evaluation completed;Education provided Falls prevention discussed    MEDICARE RISK AT HOME:  Medicare Risk at Home Any stairs in or around the home?: (Patient-Rptd) Yes If so, are there any without handrails?: (Patient-Rptd) No Home free of loose throw rugs in walkways, pet beds, electrical cords, etc?: (Patient-Rptd) Yes Adequate lighting in your home to reduce risk of falls?: (Patient-Rptd) Yes Life alert?: (Patient-Rptd) No Use of a cane, walker or w/c?: (Patient-Rptd) No Grab bars in the bathroom?: (Patient-Rptd) No Shower chair or bench in shower?: (Patient-Rptd) No Elevated toilet seat or a handicapped toilet?: (Patient-Rptd) Yes  TIMED UP AND GO:  Was the test performed?  No,video/audio  Cognitive Function: 6CIT completed    03/05/2016   11:31 AM  MMSE - Mini Mental State Exam  Orientation to time 4   Orientation to Place 5   Registration 3   Attention/ Calculation 5   Recall 3   Language- name 2 objects 2   Language- repeat 1  Language- follow 3 step command 3   Language- read & follow direction 1   Write a sentence 1   Copy design 1   Total score 29      Data saved with a previous flowsheet row definition         07/28/2024    1:15 PM 07/23/2023    1:39 PM 05/15/2022    9:10 AM  6CIT Screen  What Year? 0 points 0 points 0 points  What month? 0 points 0 points 0 points  What time? 0 points 0 points 0 points  Count back from 20 0 points 0 points 2 points  Months in reverse 0 points 0 points 4 points  Repeat phrase 2 points 0 points 0 points  Total Score 2 points 0 points 6 points    Immunizations Immunization History  Administered Date(s) Administered   Fluad Quad(high Dose 65+) 05/29/2019   INFLUENZA, HIGH DOSE SEASONAL PF 08/01/2015, 09/16/2016, 06/23/2024   Influenza Split 07/01/2020, 05/07/2021, 06/07/2022   Influenza-Unspecified 07/07/2014, 08/01/2017, 07/07/2018   PFIZER(Purple Top)SARS-COV-2 Vaccination 10/18/2019, 11/08/2019, 07/01/2020, 05/03/2021   PNEUMOCOCCAL CONJUGATE-20 09/18/2022   Pfizer(Comirnaty)Fall Seasonal Vaccine 12 years and older 07/23/2023  Pneumococcal Conjugate-13 06/01/2015   Pneumococcal Polysaccharide-23 05/30/2014   Tdap 04/25/2011, 03/14/2018, 01/26/2024   Zoster Recombinant(Shingrix ) 04/26/2019, 07/29/2019    Screening Tests Health Maintenance  Topic Date Due   Medicare Annual Wellness (AWV)  07/28/2025   DTaP/Tdap/Td (4 - Td or Tdap) 01/25/2034   Pneumococcal Vaccine: 50+ Years  Completed   Influenza Vaccine  Completed   Zoster Vaccines- Shingrix   Completed   Meningococcal B Vaccine  Aged Out   COVID-19 Vaccine  Discontinued   Hepatitis C Screening  Discontinued    Health Maintenance Items Addressed: Up to date with all HM  Additional Screening:  Vision Screening: Recommended annual ophthalmology exams for early detection of glaucoma and other disorders of the eye. Is the patient up to date with their annual eye exam?  Yes  Who is the provider or what is the name of the office in which the patient attends annual eye exams? [facility]  Dental Screening: Recommended annual dental exams for proper oral hygiene  Community  Resource Referral / Chronic Care Management: CRR required this visit?  No   CCM required this visit?  No   Plan:    I have personally reviewed and noted the following in the patient's chart:   Medical and social history Use of alcohol, tobacco or illicit drugs  Current medications and supplements including opioid prescriptions. Patient is not currently taking opioid prescriptions. Functional ability and status Nutritional status Physical activity Advanced directives List of other physicians Hospitalizations, surgeries, and ER visits in previous 12 months Vitals Screenings to include cognitive, depression, and falls Referrals and appointments  In addition, I have reviewed and discussed with patient certain preventive protocols, quality metrics, and best practice recommendations. A written personalized care plan for preventive services as well as general preventive health recommendations were provided to patient.   Lolita Libra, CMA   07/29/2024   After Visit Summary: (MyChart) Due to this being a telephonic visit, the after visit summary with patients personalized plan was offered to patient via MyChart   Notes: Nothing significant to report at this time.

## 2024-08-27 ENCOUNTER — Other Ambulatory Visit: Payer: Self-pay | Admitting: Gastroenterology

## 2024-08-27 DIAGNOSIS — K254 Chronic or unspecified gastric ulcer with hemorrhage: Secondary | ICD-10-CM

## 2024-09-06 ENCOUNTER — Other Ambulatory Visit: Payer: Self-pay | Admitting: Internal Medicine

## 2024-09-24 ENCOUNTER — Ambulatory Visit: Admitting: Cardiovascular Disease

## 2024-10-21 ENCOUNTER — Ambulatory Visit: Admitting: Cardiovascular Disease

## 2024-10-26 ENCOUNTER — Other Ambulatory Visit: Payer: Self-pay | Admitting: Gastroenterology

## 2024-10-26 DIAGNOSIS — K254 Chronic or unspecified gastric ulcer with hemorrhage: Secondary | ICD-10-CM

## 2024-11-19 ENCOUNTER — Ambulatory Visit: Admitting: Cardiovascular Disease

## 2024-12-28 ENCOUNTER — Encounter: Admitting: Internal Medicine

## 2025-08-03 ENCOUNTER — Ambulatory Visit
